# Patient Record
Sex: Male | Born: 1935 | Race: White | Hispanic: No | Marital: Married | Smoking: Former smoker
Health system: Southern US, Community
[De-identification: ages and names within clinical notes are randomized; demographics above are authoritative.]

## PROBLEM LIST (undated history)

## (undated) DIAGNOSIS — N320 Bladder-neck obstruction: Secondary | ICD-10-CM

## (undated) DIAGNOSIS — R29898 Other symptoms and signs involving the musculoskeletal system: Secondary | ICD-10-CM

## (undated) DIAGNOSIS — Z8679 Personal history of other diseases of the circulatory system: Secondary | ICD-10-CM

## (undated) DIAGNOSIS — I10 Essential (primary) hypertension: Secondary | ICD-10-CM

## (undated) DIAGNOSIS — F0390 Unspecified dementia without behavioral disturbance: Secondary | ICD-10-CM

## (undated) DIAGNOSIS — N529 Male erectile dysfunction, unspecified: Secondary | ICD-10-CM

## (undated) DIAGNOSIS — E785 Hyperlipidemia, unspecified: Secondary | ICD-10-CM

## (undated) DIAGNOSIS — G4730 Sleep apnea, unspecified: Secondary | ICD-10-CM

## (undated) DIAGNOSIS — L03011 Cellulitis of right finger: Secondary | ICD-10-CM

## (undated) DIAGNOSIS — N471 Phimosis: Secondary | ICD-10-CM

## (undated) DIAGNOSIS — I4510 Unspecified right bundle-branch block: Secondary | ICD-10-CM

## (undated) DIAGNOSIS — R2991 Unspecified symptoms and signs involving the musculoskeletal system: Secondary | ICD-10-CM

## (undated) DIAGNOSIS — C61 Malignant neoplasm of prostate: Secondary | ICD-10-CM

## (undated) DIAGNOSIS — I00 Rheumatic fever without heart involvement: Secondary | ICD-10-CM

## (undated) DIAGNOSIS — N400 Enlarged prostate without lower urinary tract symptoms: Secondary | ICD-10-CM

## (undated) DIAGNOSIS — J45909 Unspecified asthma, uncomplicated: Secondary | ICD-10-CM

## (undated) DIAGNOSIS — J309 Allergic rhinitis, unspecified: Secondary | ICD-10-CM

## (undated) DIAGNOSIS — Z9289 Personal history of other medical treatment: Secondary | ICD-10-CM

## (undated) DIAGNOSIS — K219 Gastro-esophageal reflux disease without esophagitis: Secondary | ICD-10-CM

## (undated) DIAGNOSIS — R55 Syncope and collapse: Secondary | ICD-10-CM

## (undated) DIAGNOSIS — Z9889 Other specified postprocedural states: Secondary | ICD-10-CM

## (undated) DIAGNOSIS — R350 Frequency of micturition: Secondary | ICD-10-CM

## (undated) DIAGNOSIS — E119 Type 2 diabetes mellitus without complications: Secondary | ICD-10-CM

## (undated) DIAGNOSIS — L03012 Cellulitis of left finger: Secondary | ICD-10-CM

## (undated) DIAGNOSIS — J449 Chronic obstructive pulmonary disease, unspecified: Secondary | ICD-10-CM

## (undated) DIAGNOSIS — R269 Unspecified abnormalities of gait and mobility: Secondary | ICD-10-CM

## (undated) DIAGNOSIS — I252 Old myocardial infarction: Secondary | ICD-10-CM

## (undated) DIAGNOSIS — Z8709 Personal history of other diseases of the respiratory system: Secondary | ICD-10-CM

## (undated) DIAGNOSIS — G4733 Obstructive sleep apnea (adult) (pediatric): Secondary | ICD-10-CM

## (undated) HISTORY — DX: Malignant neoplasm of prostate: C61

## (undated) HISTORY — DX: Unspecified symptoms and signs involving the musculoskeletal system: R29.91

## (undated) HISTORY — DX: Personal history of other diseases of the circulatory system: Z86.79

## (undated) HISTORY — DX: Chronic obstructive pulmonary disease, unspecified: J44.9

## (undated) HISTORY — DX: Cellulitis of right finger: L03.011

## (undated) HISTORY — DX: Old myocardial infarction: I25.2

## (undated) HISTORY — DX: Syncope and collapse: R55

## (undated) HISTORY — DX: Obstructive sleep apnea (adult) (pediatric): G47.33

## (undated) HISTORY — PX: HERNIA REPAIR: SHX51

## (undated) HISTORY — DX: Personal history of other diseases of the respiratory system: Z87.09

## (undated) HISTORY — DX: Type 2 diabetes mellitus without complications: E11.9

## (undated) HISTORY — DX: Essential (primary) hypertension: I10

## (undated) HISTORY — DX: Benign prostatic hyperplasia without lower urinary tract symptoms: N40.0

## (undated) HISTORY — DX: Unspecified dementia, unspecified severity, without behavioral disturbance, psychotic disturbance, mood disturbance, and anxiety: F03.90

## (undated) HISTORY — DX: Hyperlipidemia, unspecified: E78.5

## (undated) HISTORY — DX: Personal history of other medical treatment: Z92.89

## (undated) HISTORY — DX: Bladder-neck obstruction: N32.0

## (undated) HISTORY — DX: Unspecified asthma, uncomplicated: J45.909

## (undated) HISTORY — DX: Rheumatic fever without heart involvement: I00

## (undated) HISTORY — DX: Frequency of micturition: R35.0

## (undated) HISTORY — DX: Other symptoms and signs involving the musculoskeletal system: R29.898

## (undated) HISTORY — DX: Unspecified abnormalities of gait and mobility: R26.9

## (undated) HISTORY — DX: Male erectile dysfunction, unspecified: N52.9

## (undated) HISTORY — DX: Other specified postprocedural states: Z98.890

## (undated) HISTORY — DX: Cellulitis of left finger: L03.012

## (undated) HISTORY — DX: Phimosis: N47.1

## (undated) HISTORY — DX: Sleep apnea, unspecified: G47.30

## (undated) HISTORY — DX: Allergic rhinitis, unspecified: J30.9

## (undated) HISTORY — DX: Gastro-esophageal reflux disease without esophagitis: K21.9

## (undated) HISTORY — DX: Unspecified right bundle-branch block: I45.10

## (undated) HISTORY — PX: VASECTOMY: SHX75

---
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
## 1998-10-23 ENCOUNTER — Ambulatory Visit (HOSPITAL_COMMUNITY): Admission: RE | Admit: 1998-10-23 | Discharge: 1998-10-23 | Payer: Self-pay | Admitting: Gastroenterology

## 2003-12-19 ENCOUNTER — Ambulatory Visit (HOSPITAL_COMMUNITY): Admission: RE | Admit: 2003-12-19 | Discharge: 2003-12-19 | Payer: Self-pay | Admitting: Gastroenterology

## 2004-10-12 ENCOUNTER — Emergency Department (HOSPITAL_COMMUNITY): Admission: EM | Admit: 2004-10-12 | Discharge: 2004-10-12 | Payer: Self-pay | Admitting: Emergency Medicine

## 2005-11-17 ENCOUNTER — Encounter: Admission: RE | Admit: 2005-11-17 | Discharge: 2005-11-17 | Payer: Self-pay | Admitting: Internal Medicine

## 2006-05-31 ENCOUNTER — Ambulatory Visit (HOSPITAL_COMMUNITY): Admission: RE | Admit: 2006-05-31 | Discharge: 2006-05-31 | Payer: Self-pay | Admitting: Urology

## 2006-06-08 ENCOUNTER — Ambulatory Visit: Admission: RE | Admit: 2006-06-08 | Discharge: 2006-08-07 | Payer: Self-pay | Admitting: Radiation Oncology

## 2006-08-16 ENCOUNTER — Encounter: Admission: RE | Admit: 2006-08-16 | Discharge: 2006-08-16 | Payer: Self-pay | Admitting: Urology

## 2006-08-23 ENCOUNTER — Ambulatory Visit (HOSPITAL_BASED_OUTPATIENT_CLINIC_OR_DEPARTMENT_OTHER): Admission: RE | Admit: 2006-08-23 | Discharge: 2006-08-23 | Payer: Self-pay | Admitting: Urology

## 2006-09-13 ENCOUNTER — Ambulatory Visit: Admission: RE | Admit: 2006-09-13 | Discharge: 2006-12-20 | Payer: Self-pay | Admitting: Radiation Oncology

## 2008-06-05 ENCOUNTER — Observation Stay (HOSPITAL_COMMUNITY): Admission: EM | Admit: 2008-06-05 | Discharge: 2008-06-06 | Payer: Self-pay | Admitting: Emergency Medicine

## 2008-10-16 ENCOUNTER — Encounter: Payer: Self-pay | Admitting: Pulmonary Disease

## 2008-11-14 DIAGNOSIS — J449 Chronic obstructive pulmonary disease, unspecified: Secondary | ICD-10-CM

## 2008-11-14 HISTORY — DX: Chronic obstructive pulmonary disease, unspecified: J44.9

## 2008-11-15 ENCOUNTER — Ambulatory Visit: Payer: Self-pay | Admitting: Pulmonary Disease

## 2008-11-15 DIAGNOSIS — J309 Allergic rhinitis, unspecified: Secondary | ICD-10-CM

## 2008-11-15 DIAGNOSIS — I252 Old myocardial infarction: Secondary | ICD-10-CM

## 2008-11-15 DIAGNOSIS — E785 Hyperlipidemia, unspecified: Secondary | ICD-10-CM | POA: Insufficient documentation

## 2008-11-15 DIAGNOSIS — I10 Essential (primary) hypertension: Secondary | ICD-10-CM | POA: Insufficient documentation

## 2008-11-15 DIAGNOSIS — I219 Acute myocardial infarction, unspecified: Secondary | ICD-10-CM | POA: Insufficient documentation

## 2008-11-15 HISTORY — DX: Essential (primary) hypertension: I10

## 2008-11-15 HISTORY — DX: Old myocardial infarction: I25.2

## 2008-11-15 HISTORY — DX: Allergic rhinitis, unspecified: J30.9

## 2008-11-20 DIAGNOSIS — R05 Cough: Secondary | ICD-10-CM | POA: Insufficient documentation

## 2008-11-20 DIAGNOSIS — R059 Cough, unspecified: Secondary | ICD-10-CM | POA: Insufficient documentation

## 2008-11-29 ENCOUNTER — Ambulatory Visit: Payer: Self-pay | Admitting: Pulmonary Disease

## 2009-01-02 ENCOUNTER — Telehealth (INDEPENDENT_AMBULATORY_CARE_PROVIDER_SITE_OTHER): Payer: Self-pay | Admitting: *Deleted

## 2009-01-02 ENCOUNTER — Ambulatory Visit: Payer: Self-pay | Admitting: Internal Medicine

## 2009-01-02 ENCOUNTER — Ambulatory Visit: Payer: Self-pay | Admitting: Pulmonary Disease

## 2009-01-02 DIAGNOSIS — C61 Malignant neoplasm of prostate: Secondary | ICD-10-CM

## 2009-01-02 HISTORY — DX: Malignant neoplasm of prostate: C61

## 2009-03-13 ENCOUNTER — Encounter: Admission: RE | Admit: 2009-03-13 | Discharge: 2009-03-13 | Payer: Self-pay | Admitting: Internal Medicine

## 2009-05-29 ENCOUNTER — Encounter
Admission: RE | Admit: 2009-05-29 | Discharge: 2009-05-29 | Payer: Self-pay | Admitting: Physical Medicine and Rehabilitation

## 2010-09-30 ENCOUNTER — Ambulatory Visit: Payer: Self-pay | Admitting: Cardiovascular Disease

## 2010-11-18 NOTE — Assessment & Plan Note (Signed)
Summary: cough/congestion/see tp per kc/LC   Primary Provider/Referring Provider:  Earl Gala  CC:  increased SOB, prod cough with white/yellow mucus occ spotted with blood, head congestion/sinus pressure with PND, and chills x1 day - patient states he has be battling with these symptoms since 09-2008.  History of Present Illness: 76 year old male with known history of HTN presented for initial pulmonary consult on 128//2010 for persistent cough/dyspnea.   11/29/08--the pt comes in today for f/u of his cyclical cough.  He is 90% improved, and feels that it is just about gone.   He feels no further tickle, and there has been no mucus production. After Advair was stopped, rec to change ACE to Micardis, and PPI w/ voice rest/ cough suppression regimen.   January 02, 2009--Returns w/ increased SOB, prod cough with white/yellow mucus occ spotted with blood, head congestion/sinus pressure with PND, chills x1 day - patient states he has be battling with these symptoms since 09-2008. After review, it appears pt restarted lisinopril  7-10 days ago after follow up w/ Dr. Melburn Popper. Reports he was doing well w/ almost total resolution of cough until last week.  Denies chest pain, dyspnea, orthopnea,  fever, n/v/d, edema, headache.  Current Problems (verified): 1)  Hx of Prostate Cancer  (ICD-185) 2)  Cough  (ICD-786.2) 3)  Hx of Myocardial Infarction  (ICD-410.90) 4)  Hypertension  (ICD-401.9) 5)  Hyperlipidemia  (ICD-272.4) 6)  Allergic Rhinitis  (ICD-477.9) 7)  Asthma  (ICD-493.90)  Current Medications (verified): 1)  Dyazide 37.5-25 Mg Caps (Triamterene-Hctz) .... Once Daily 2)  Adult Aspirin Ec Low Strength 81 Mg Tbec (Aspirin) .... Once Daily 3)  Cvs Ibuprofen 200 Mg Caps (Ibuprofen) .... 3 Tabs Once Daily As Needed , The Two Times A Day 4)  Finasteride 5 Mg Tabs (Finasteride) .... Once Daily 5)  Nitro-Dur 0.4 Mg/hr Pt24 (Nitroglycerin) .... Under The Tongue As Needed For Chest Pain  6)  Metoprolol Succinate 25 Mg Xr24h-Tab (Metoprolol Succinate) .Marland Kitchen.. 1 Ta Once A Day 7)  Crestor 5 Mg Tabs (Rosuvastatin Calcium) .... Once Daily 8)  Acid Controller Max St 20 Mg Tabs (Famotidine) .... Two Times A Day 9)  Micardis 40 Mg Tabs (Telmisartan) .... Once Daily 10)  Tessalon Perles 100 Mg  Caps (Benzonatate) .... One To Two By Mouth 3-4 Times Daily  Allergies (verified): 1)  ! * Albuterol Inhaler 2)  ! * Advair  Past History:  Past Medical History:    Asthma    Hx of PROSTATE CANCER (ICD-185)    COUGH (ICD-786.2)    Hx of MYOCARDIAL INFARCTION (ICD-410.90)    HYPERTENSION (ICD-401.9)    HYPERLIPIDEMIA (ICD-272.4)    ALLERGIC RHINITIS (ICD-477.9)    ASTHMA (ICD-493.90)       Review of Systems      See HPI  Vital Signs:  Patient profile:   76 year old male Height:      70 inches Weight:      205 pounds BMI:     29.52 O2 Sat:      96 % Temp:     97.8 degrees F oral Pulse rate:   86 / minute BP sitting:   134 / 88  (left arm) Cuff size:   regular  Vitals Entered By: Boone Master CNA (January 02, 2009 4:30 PM)  O2 Flow:  room air CC: increased SOB, prod cough with white/yellow mucus occ spotted with blood, head congestion/sinus pressure with PND, chills x1 day - patient states he has be  battling with these symptoms since 09-2008 Is Patient Diabetic? No Comments Medications reviewed with patient Boone Master CNA  January 02, 2009 4:30 PM    Physical Exam  Additional Exam:  GEN: A/Ox3; pleasant , NAD HEENT:  [place]/AT, , EACs-clear, TMs-wnl, NOSE-clear discharge, THROAT-clear NECK:  Supple w/ fair ROM; no JVD; normal carotid impulses w/o bruits; no thyromegaly or nodules palpated; no lymphadenopathy. CHEST:  Coarse BS w/ no wheezing , cough on forced expiration HEART:  RRR, no m/r/g   ABDOMEN:  Soft & nt; nml bowel sounds; no organomegaly or masses detected. EXT: Warm bil,  no calf pain, edema, clubbing, pulses intact Neuro:no focal deficits noted    Pulmonary Function Test Height (in.): 70 Gender: Male  Impression & Recommendations:  Problem # 1:  COUGH (ICD-786.2) Upper airway cough syndrome-suspect is multifactoral in nature, reflux, ACE, +/- rhinitis  Will check xray today Cough was improved off ACE, will change back to ARB  REC: Mucinex DM two times a day  Tessalon perles three times a day for cough if still couging Tussicaps two times a day as needed cough  Stop Lisinopril Restart Micardis once daily  Voice rest, follow cyclical cough sheet.  Zytec 10mg  at bedtime for nasal drainage.  Increased pepcid two times a day  follow up 2 weeks Dr. Shelle Iron  Please contact office for sooner follow up if symptoms do not improve or worsen   Medications Added to Medication List This Visit: 1)  Tussicaps 10-8 Mg Xr12h-cap (Hydrocod polst-chlorphen polst) .Marland Kitchen.. 1 by mouth two times a day as needed cough  Complete Medication List: 1)  Dyazide 37.5-25 Mg Caps (Triamterene-hctz) .... Once daily 2)  Adult Aspirin Ec Low Strength 81 Mg Tbec (Aspirin) .... Once daily 3)  Cvs Ibuprofen 200 Mg Caps (Ibuprofen) .... 3 tabs once daily as needed , the two times a day 4)  Finasteride 5 Mg Tabs (Finasteride) .... Once daily 5)  Nitro-dur 0.4 Mg/hr Pt24 (Nitroglycerin) .... Under the tongue as needed for chest pain 6)  Metoprolol Succinate 25 Mg Xr24h-tab (Metoprolol succinate) .Marland Kitchen.. 1 ta once a day 7)  Crestor 5 Mg Tabs (Rosuvastatin calcium) .... Once daily 8)  Acid Controller Max St 20 Mg Tabs (Famotidine) .... Two times a day 9)  Micardis 40 Mg Tabs (Telmisartan) .... Once daily 10)  Tessalon Perles 100 Mg Caps (Benzonatate) .... One to two by mouth 3-4 times daily 11)  Tussicaps 10-8 Mg Xr12h-cap (Hydrocod polst-chlorphen polst) .Marland Kitchen.. 1 by mouth two times a day as needed cough  Other Orders: T-2 View CXR, Same Day (71020.5TC) Est. Patient Level III (16109) Prescription Created Electronically [id number])  Patient Instructions:  1)  Mucinex DM two times a day  2)  Tessalon perles three times a day for cough 3)  if still couging Tussicaps two times a day as needed cough  4)  Stop Lisinopril 5)  Restart Micardis once daily  6)  Voice rest, follow cyclical cough sheet.  7)  Zytec 10mg  at bedtime for nasal drainage.  8)  Increased pepcid two times a day  9)  follow up 2 weeks Dr. Shelle Iron  10)  Please contact office for sooner follow up if symptoms do not improve or worsen  Prescriptions: TUSSICAPS 10-8 MG XR12H-CAP (HYDROCOD POLST-CHLORPHEN POLST) 1 by mouth two times a day as needed cough  #20 x 0   Entered and Authorized by:   Rubye Oaks NP   Signed by:   Rubye Oaks NP on 01/02/2009  Method used:   Print then Give to Patient   RxID:   [id number] TESSALON PERLES 100 MG  CAPS (BENZONATATE) One to two by mouth 3-4 times daily  #30 x 1   Entered and Authorized by:   Rubye Oaks NP   Signed by:   Rubye Oaks NP on 01/02/2009   Method used:   Electronically to        [id number] (retail)       [address] [place], [place]  [address]       Ph: [phone]       Fax: [phone]   RxID:   [id number]

## 2010-11-18 NOTE — Progress Notes (Signed)
Summary: sick  Phone Note Call from Patient Call back at Home Phone [phone]   Caller: Spouse Call For: clance Reason for Call: Acute Illness, Talk to Nurse Complaint: Cough/Sore throat Summary of Call: bad cough, sore throat, some blood last night(small amt), chills, sneezing, sob,head and chest congestion. Initial call taken by: Eugene Gavia,  January 02, 2009 9:21 AM  Follow-up for Phone Call        Pt c/o severe cough and congestion. Pt says he cougherd up a small amount of blood yesterday and he is more sob. Per KC, pt should come see TP. Pt to see TP today at 4:15. Follow-up by: Michel Bickers CMA,  January 02, 2009 10:00 AM

## 2010-11-18 NOTE — Assessment & Plan Note (Signed)
Summary: rov for cough.   PCP:  Earl Gala  Chief Complaint:  2 week cough follow-up.  Pt states cough is much improved and no problems currently.Marland Kitchen  History of Present Illness: the pt comes in today for f/u of his cyclical cough.  He is 90% improved, and feels that it is just about gone.   He feels no further tickle, and there has been no mucus production.    Prior Medications Reviewed Using: List Brought by Patient  Updated Prior Medication List: PROVENTIL HFA 108 (90 BASE) MCG/ACT AERS (ALBUTEROL SULFATE) 2 puffs every 4 hr DYAZIDE 37.5-25 MG CAPS (TRIAMTERENE-HCTZ) once daily ADULT ASPIRIN EC LOW STRENGTH 81 MG TBEC (ASPIRIN) once daily [facility] IBUPROFEN 200 MG CAPS (IBUPROFEN) 3 tabs once daily as needed , the two times a day FINASTERIDE 5 MG TABS (FINASTERIDE) once daily NITRO-DUR 0.4 MG/HR PT24 (NITROGLYCERIN) under the tongue as needed for chest pain METOPROLOL SUCCINATE 25 MG XR24H-TAB (METOPROLOL SUCCINATE) 1 ta once a day CRESTOR 5 MG TABS (ROSUVASTATIN CALCIUM) once daily ACID CONTROLLER MAX ST 20 MG TABS (FAMOTIDINE) two times a day MICARDIS 40 MG TABS (TELMISARTAN) once daily TESSALON PERLES 100 MG  CAPS (BENZONATATE) One to two by mouth 3-4 times daily  Current Allergies (reviewed today): No known allergies   Vital Signs:  Patient Profile:   76 Years Old Male Weight:      207.38 pounds O2 Sat:      95 % O2 treatment:    Room Air Temp:     97.5 degrees F oral Pulse rate:   70 / minute BP sitting:   138 / 78  (left arm) Cuff size:   regular  Vitals Entered By: Cloyde Reams RN (November 29, 2008 8:56 AM)             Comments Pt is here today for a 2 week follow-up visit.  Medications reviewed with patient Cloyde Reams RN  November 29, 2008 8:57 AM      Physical Exam  General:     wd male in nad   Impression & Recommendations:  Problem # 1:  COUGH (ICD-786.2)  the pt's cough has almost totally resolved.  He should continue with the behavioral techniques, and can use tessalon pearls as needed to keep his cough from re-escalating.  He will f/u as needed.  Patient Instructions: 1)  gradually increase voice use 2)  continue with lozenges, as needed tessalon pearls 3)  please call if cough recurs.

## 2011-03-03 NOTE — H&P (Signed)
NAME:  KADYN, GUILD NO.:  [id number]   MEDICAL RECORD NO.:  [id number]          PATIENT TYPE:  EMS   LOCATION:  MAJO                         FACILITY:  [facility]   PHYSICIAN:  Hollice Espy, M.D.    DATE OF BIRTH:   DATE OF ADMISSION:  06/05/2008  DATE OF DISCHARGE:                              HISTORY & PHYSICAL   PRIMARY CARE PHYSICIAN:  Theressa Millard, M.D.   CONSULTANT:  Vesta Mixer, M.D., Cardiology.   CHIEF COMPLAINTS:  Chest pain.   HISTORY OF PRESENT ILLNESS:  The patient is a 76 year old white male  with past medical history of hyperlipidemia, hypertension and tobacco  abuse who today started having severe pain in his chest.  He describes  it initially starting over his left scapula, radiating to the left side  of his chest.  He describes it as a squeezing, aching, severe 10/10  pain.  It also went up to his left shoulder and found to have some left  arm associated numbness.  He noted  some associated shortness of breath  and some lightheadedness.  The patient then came into the emergency  room.  He was given doses of  nitroglycerin, aspirin and morphine and  then eventually the pain subsided leaving him with some residual  pressure and then eventually went away completely.  The patient had an  EKG done which showed an incomplete right bundle branch block which was  slightly different from his previous EKG 2 years prior.  A chest x-ray  was done, which showed no evidence of any acute pulmonary disease and  cardiac markers done which were unremarkable.  Currently the patient is  feeling better.  He denies any headaches, vision changes, dysphagia,  chest pain, palpitations, shortness of breath, wheeze, cough, abdominal  pain, hematuria, dysuria, constipation, diarrhea, focal extremity  numbness, weakness or pain.   REVIEW OF SYSTEMS:  Otherwise negative.   PAST MEDICAL HISTORY:  Includes tobacco abuse with cigars hypertension,  hyperlipidemia,  rhabdomyolysis secondary statin, so he no longer takes  that.  Seasonal rhinitis, and BPH.   MEDICATIONS:  The patient is on finasteride 5, triamterene 37.5/25.  He  was on a prednisone taper,  but stopped taking the prednisone because it  made him jittery and have restless sleep.   ALLERGIES:  TETRACYCLINE AND PENICILLIN.   SOCIAL HISTORY:  No alcohol or drug use.  He does smoke about 3-4 cigars  a day.   FAMILY HISTORY:  Noted for mom with CHF.   PHYSICAL EXAMINATION:  VITAL SIGNS:  On admission temperature 97.6.  Heart rate 82, blood pressure 110/69, respirations 20, O2 sat 98% on 2  L.  GENERAL:  He is alert and oriented x3 in no apparent distress.  HEENT:  Normocephalic atraumatic.  Mucous membranes are moist.  He has  no carotid bruits.  HEART:  Regular rate and rhythm.  S1 and S2.  LUNGS:  Clear to auscultation bilaterally.  ABDOMEN:  Soft, nontender, nondistended.  Positive bowel sounds.  EXTREMITIES:  Show no clubbing, cyanosis or edema.   LAB WORK:  Chest  x-ray shows no acute cardiopulmonary disease.  Area of  apparent superior mediastinal soft tissue fullness and peribronchial  thickening, likely from chronic bronchitis or smoking.   Lab work:  Sodium 139, potassium 2.9, chloride 104, BUN 16, creatinine  1.1, glucose 112.  CPK 24, MB less than 1, troponin-I less than 0.05,  white count 9.1, H and H 13.8 and 40.  MCV of 9.  Platelet count 160, no  shift.  LFTs are noted for an albumin of 3.1.  EKG notes a normal sinus  rhythm, occasional PVCs, left axis deviation, incomplete right bundle  branch block.  When compared to an EKG in 2007, at that time shows some  left axis deviation.   ASSESSMENT AND PLAN:  1. Chest pain.  Good story, plus he has a history of hyperlipidemia,      not on statins and now a possible new incomplete right bundle      branch block on electrocardiogram.  Place on telemetry bed.  Check      enzymes x3.  I have spoken to Dr. Delane Ginger, who is  covering for      [facility].  He will come and see the patient and decide if      he needs an inpatient versus outpatient stress test.  2. Hypertension.  Continue meds.  3. Tobacco abuse.      Hollice Espy, M.D.  Electronically Signed     SKK/MEDQ  D:  06/05/2008  T:  06/05/2008  Job:  [id number]   cc:   Theressa Millard, M.D.  Vesta Mixer, M.D.

## 2011-03-03 NOTE — Consult Note (Signed)
NAME:  ELLARD, NAN NO.:  [id number]   MEDICAL RECORD NO.:  [id number]          PATIENT TYPE:  OBV   LOCATION:  1828                         FACILITY:  [facility]   PHYSICIAN:  Vesta Mixer, M.D. DATE OF BIRTH:  1935/08/25   DATE OF CONSULTATION:  06/05/2008  DATE OF DISCHARGE:                                 CONSULTATION   [name] [name] is a 76 year old gentleman with a history of  hypercholesterolemia, hypertension, and prostate cancer.  He was  admitted to the hospital after having a severe episode of chest pain  earlier today.   The patient was eating lunch today around 2 o'clock.  He developed  severe intense back pain that he felt like the worst muscle spasm of his  life.  It radiated through to his front.  This was associated with  nausea, diaphoresis, shortness of breath, and some left arm tingling and  numbness.  The pain lasted for hours and he came to the emergency room.  He was given several nitroglycerin with no relief.  He was also given  several milligrams of morphine with incomplete relief.  It was finally  relieved 6-8 hours later.   CURRENT MEDICATIONS:  1. Maxzide once a day.  2. Prednisone 20 mg a day.  3. Finasteride once a day.   ALLERGIES:  He is allergic to PENICILLIN and TETRACYCLINE.   PAST MEDICAL HISTORY:  1. Hypertension.  2. Chest pain.  3. Prostate cancer.  4. Hyperlipidemia.   SOCIAL HISTORY:  The patient smokes cigars.   FAMILY HISTORY:  Noncontributory.   REVIEW OF SYSTEMS:  Otherwise is negative.   PHYSICAL EXAMINATION:  GENERAL:  He is an elderly gentleman in no acute  distress.  He is alert and oriented x3 and his mood and affect are  normal.  VITAL SIGNS:  His blood pressure is 110/69 with heart rate of 82.  LUNGS:  Clear.  HEART:  Regular rate.  S1-S2.  ABDOMEN:  Good bowel sounds and is nontender.  EXTREMITIES:  No clubbing, cyanosis, or edema.  NEUROLOGIC:  Nonfocal.   LABORATORY DATA:  CBC is within  normal limits.  His Chem-7 reveals a  potassium of 3.3.  His CPK is 15 with 1.0 MBs.  His troponin is 0.01.   His EKG reveals an incomplete right bundle branch block.  He has a  normal sinus rhythm.   IMPRESSION AND PLAN:  Chest pain.  The patient's chest pain is entirely  consistent with angina.  It lasted for 6 hours.  His enzymes are  currently negative.  If his enzymes are negative after this prolonged  episode of chest pain, then I suspect that is noncardiac in origin.  On  the other hand, if his enzymes are positive, then he will need a heart  catheterization tomorrow.  We will collect further enzymes tonight and  tomorrow morning.  If his enzymes are positive, we should start him on  heparin.  We will plan on doing a stress Cardiolite study as an  outpatient if they are negative.  ______________________________  Vesta Mixer, M.D.     PJN/MEDQ  D:  06/05/2008  T:  06/06/2008  Job:  [id number]   cc:   Theressa Millard, M.D.

## 2011-03-03 NOTE — Discharge Summary (Signed)
NAME:  DANIELA, HERNAN NO.:  [id number]   MEDICAL RECORD NO.:  [id number]          PATIENT TYPE:  OBV   LOCATION:  3729                         FACILITY:  [facility]   PHYSICIAN:  Theressa Millard, M.D.    DATE OF BIRTH:  16-Jan-1935   DATE OF ADMISSION:  06/05/2008  DATE OF DISCHARGE:  06/06/2008                               DISCHARGE SUMMARY   ADMITTING DIAGNOSIS:  Prolonged chest pain.   DISCHARGE DIAGNOSES:  1. Prolonged chest pain, ? etiology, no evidence of myocardial      infarction, pulmonary embolism or aortic dissection.  2. Hypertension.  3. Hyperlipidemia (untreated secondary to muscle problems associated      with statins).  4. Seasonal rhinitis.  5. Benign prostatic hypertrophy.  6. Atherosclerotic peripheral vascular disease with evidence of      coronary and aortic calcifications, as well as 3 cm infrarenal      abdominal aortic aneurysm and other evidence of atheromatous      plaquing.   The patient is a 76 year old white male who presented with a six-hour  history of severe chest pain that started actually in the mid scapular  region and radiated around to the chest.  Pain was very severe, not  associated with nausea, vomiting, diaphoresis, or shortness of breath.  He came to the emergency department.   HOSPITAL COURSE:  The patient was admitted.  On the basis of serial  enzymes and EKGs, a myocardial infarction was ruled out.  He was seen in  consultation by Dr. Elease Hashimoto, who thought that this was either  representative of a myocardial infarction or was not coronary at all.  My opinion was that the patient needed a CT scan of the chest after his  myocardial infarction was ruled out, and this was undertaken.  Findings  are described above.  In the absence of evidence of a myocardial  infarction, aortic dissection or pulmonary embolism, the patient was  discharged in improved condition.  I thought that this likely represents  angina because of the  long duration of the discomfort.  At this point,  we will consider an outpatient stress test in the future and otherwise  observe.   DISCHARGE MEDICATIONS:  1. Pepcid AC 1 b.i.d.  2. Ibuprofen 600 mg p.r.n.  3. Finasteride 5 mg daily.  4. Triamterene 37.5 and hydrochlorothiazide 25 mg daily.   FOLLOWUP:  He will keep his scheduled appointment with me.   ACTIVITIES:  As tolerated.   DIET:  No added salt.      Theressa Millard, M.D.  Electronically Signed     JO/MEDQ  D:  06/06/2008  T:  06/07/2008  Job:  [id number]

## 2011-03-06 NOTE — Op Note (Signed)
NAME:  [name] [name], [name] [name]                          ACCOUNT NO.:  [id number]   MEDICAL RECORD NO.:  [id number]                   PATIENT TYPE:  AMB   LOCATION:  ENDO                                 [facility]ACILITY[facility]  [facility]   PHYSICIAN:  Danise Edge, M.D.                DATE OF BIRTH:  [birth year]   DATE OF PROCEDURE:  12/19/2003  DATE OF DISCHARGE:                                 OPERATIVE REPORT   REFERRING PHYSICIAN:  Theressa Millard, M.D.   PROCEDURE PERFORMED:  Colonoscopy.   ENDOSCOPIST:  Charolett Bumpers, M.D.   INDICATIONS FOR PROCEDURE:  Jkwon Treptow is a 76 year old male born  09-30-1935.  Mr. Finnan is scheduled to undergo a screening colonoscopy  with polypectomy to prevent colon cancer.   PREMEDICATION:  Versed 7.5 mg.  Demerol 75 mg.   DESCRIPTION OF PROCEDURE:  After obtaining informed consent, Mr. Mercadel was  placed in the left lateral decubitus position.  I administered intravenous  Demerol and intravenous Versed to achieve conscious sedation for the  procedure.  The patient's blood pressure, oxygen saturations and cardiac  rhythm were monitored throughout the procedure and documented in the medical  record.   Anal inspection was normal.  Digital rectal exam was normal.  The prostate  was nonnodular.  The adjustable pediatric Olympus video colonoscope was  introduced into the rectum and advanced to the cecum.  Colonic preparation  for the exam today was satisfactory.   Rectum:  Normal.   Sigmoid colon and descending colon:  Left colonic diverticulosis.   Splenic flexure:  Normal.   Transverse colon:  Normal.   Hepatic flexure:  Normal.   Ascending colon:  Normal.   Cecum and ileocecal valve:  Normal.   ASSESSMENT:  Normal screening proctocolonoscopy to the cecum.  No endoscopic  evidence for the presence of colorectal neoplasia.                                               Danise Edge, M.D.    MJ/MEDQ  D:  12/19/2003  T:  12/19/2003  Job:   [id number]   cc:   Theressa Millard, M.D.  [address] [place]  [place] [address]  Fax: [phone]

## 2011-03-06 NOTE — Op Note (Signed)
NAME:  [name] [name], [name] [name] NO.:  [id number]   MEDICAL RECORD NO.:  [id number]          PATIENT TYPE:  AMB   LOCATION:  [facility]                         FACILITY:  [facility]   PHYSICIAN:  Boston Service, M.D.DATE OF BIRTH:  [birth year]   DATE OF PROCEDURE:  08/23/2006  DATE OF DISCHARGE:                                 OPERATIVE REPORT   PREOPERATIVE DIAGNOSIS:  Reference is made to office notes from 08/10/2006,  06/25/2006, second opinion Crecencio Mc, MD 06/04/2006, consultation with  Chipper Herb, MD August 2007 and office notes from 05/25/2006 and  05/19/2006.  Biopsy 05/19/2006 prostatic adenocarcinoma, Gleason VII 40% on  the right and PSA of 6.4.   PREOPERATIVE DIAGNOSIS:  Gleason VII prostatic adenocarcinoma 40% on the  right. CT scan 05/31/2006, bone scan 05/31/2006 showed no obvious evidence  of metastatic disease.  After lengthy discussion with the patient and family  members elected external radiation therapy, IMRT followed by seed implant.   POSTOPERATIVE DIAGNOSIS:  Gleason VII prostatic adenocarcinoma 40% on the  right. CT scan 05/31/2006, bone scan 05/31/2006 showed no obvious evidence  of metastatic disease.  After lengthy discussion with the patient and family  members elected external radiation therapy, IMRT followed by seed implant.   PROCEDURE:  Transperineal radioactive seed implant.   UROLOGIST:  Boston Service, MD.   RADIATION ONCOLOGIST:  Maryln Gottron, MD   ANESTHESIA:  General.   DRAINS:  18-French Foley.   ESTIMATED BLOOD LOSS:  Minimal.   COMPLICATIONS:  None obvious.   DESCRIPTION OF PROCEDURE:  The patient was prepped and draped in the dorsal  lithotomy position after institution of an adequate level of general  anesthesia.  Rectal tube was placed, transrectal ultrasound probe,  suprapubic fluoroscopy unit.  Planning in dosimetry carried out in  consultation with [employer].  Sonographic planning of the implant  was  carried out.  Transverse and longitudinal scans of the prostate were  obtained in an effort to duplicate previous scans.  The patient has now  completed his course of external IMRT radiation therapy.  Total number of  needles selected 21. Total number of seeds 53.  Isotope I-125, activity  15.476 mCi per seed.  With stabilization needles placed seeds were then  placed according to prearranged coordinates.  Reference is made to the  sonographic planning report from [employer]. Seeds appeared to be in good  position.  Once all seeds had been placed, stabilizing needles were removed.  Rectal tube was removed, transrectal ultrasound probe was removed.  Indwelling Foley catheter was also removed.  Fiberoptic cystoscopy showed a  normal urethra and sphincter, nonobstructive prostatic urethra.  Careful  inspection of the trigone showed clear efflux right and left orifice with no  obvious evidence of intravesical pathology, specifically no evidence of  seeds or bleeding sites within the bladder.  Bladder was filled to capacity.  Fiberoptic cystoscope was gently withdrawn.  18-French Foley was  inserted and left to straight drain.  The patient was returned to recovery  in satisfactory condition.  Plan is to remove Foley catheter tomorrow a.m.  for  a voiding trial the patient has adequate supply of Flomax, Vicodin,  Macrobid and Phenergan.           ______________________________  Boston Service, M.D.     RH/MEDQ  D:  08/23/2006  T:  08/23/2006  Job:  [id number]   cc:   Theressa Millard, M.D.  Fax: [phone]   Maryln Gottron, M.D.  Fax: [phone]

## 2011-04-07 ENCOUNTER — Encounter: Payer: Self-pay | Admitting: Cardiovascular Disease

## 2011-04-13 ENCOUNTER — Encounter: Payer: Self-pay | Admitting: Cardiovascular Disease

## 2011-04-13 ENCOUNTER — Other Ambulatory Visit (INDEPENDENT_AMBULATORY_CARE_PROVIDER_SITE_OTHER): Payer: 59 | Admitting: *Deleted

## 2011-04-13 ENCOUNTER — Ambulatory Visit (INDEPENDENT_AMBULATORY_CARE_PROVIDER_SITE_OTHER): Payer: 59 | Admitting: Cardiovascular Disease

## 2011-04-13 VITALS — BP 134/88 | HR 88 | Ht 70.0 in | Wt 197.8 lb

## 2011-04-13 DIAGNOSIS — M6281 Muscle weakness (generalized): Secondary | ICD-10-CM

## 2011-04-13 DIAGNOSIS — R2991 Unspecified symptoms and signs involving the musculoskeletal system: Secondary | ICD-10-CM

## 2011-04-13 DIAGNOSIS — R29898 Other symptoms and signs involving the musculoskeletal system: Secondary | ICD-10-CM | POA: Insufficient documentation

## 2011-04-13 DIAGNOSIS — I10 Essential (primary) hypertension: Secondary | ICD-10-CM

## 2011-04-13 DIAGNOSIS — E785 Hyperlipidemia, unspecified: Secondary | ICD-10-CM

## 2011-04-13 DIAGNOSIS — R0989 Other specified symptoms and signs involving the circulatory and respiratory systems: Secondary | ICD-10-CM

## 2011-04-13 HISTORY — DX: Unspecified symptoms and signs involving the musculoskeletal system: R29.91

## 2011-04-13 HISTORY — DX: Other symptoms and signs involving the musculoskeletal system: R29.898

## 2011-04-13 LAB — BASIC METABOLIC PANEL
BUN: 16 mg/dL (ref 6–23)
CO2: 27 mEq/L (ref 19–32)
Calcium: 9.1 mg/dL (ref 8.4–10.5)
Chloride: 102 mEq/L (ref 96–112)
Creatinine, Ser: 0.8 mg/dL (ref 0.4–1.5)
GFR: 98.88 mL/min (ref >60.00)
Glucose, Bld: 122 mg/dL — ABNORMAL HIGH (ref 70–99)
Potassium: 3.9 mEq/L (ref 3.5–5.1)
Sodium: 139 mEq/L (ref 135–145)

## 2011-04-13 LAB — LIPID PANEL
Cholesterol: 140 mg/dL (ref 0–200)
HDL: 31.6 mg/dL — ABNORMAL LOW (ref >39.00)
LDL Cholesterol: 81 mg/dL (ref 0–99)
Total CHOL/HDL Ratio: 4
Triglycerides: 135 mg/dL (ref 0.0–149.0)
VLDL: 27 mg/dL (ref 0.0–40.0)

## 2011-04-13 LAB — HEPATIC FUNCTION PANEL
ALT: 14 U/L (ref 0–53)
AST: 14 U/L (ref 0–37)
Albumin: 4.3 g/dL (ref 3.5–5.2)
Alkaline Phosphatase: 65 U/L (ref 39–117)
Bilirubin, Direct: 0.1 mg/dL (ref 0.0–0.3)
Total Bilirubin: 0.5 mg/dL (ref 0.3–1.2)
Total Protein: 7.1 g/dL (ref 6.0–8.3)

## 2011-04-13 NOTE — Assessment & Plan Note (Signed)
Well controlled.  Continue with current meds.

## 2011-04-13 NOTE — Progress Notes (Signed)
[name] [name] Date of Birth  Apr 04, 1935 [facility] [address]     [address] [place], [place]  [address] [phone]  Fax  [phone]  History of Present Illness:  No cardiac complaints.  Some congestion recently.  Complains of lower leg weakness. No recent orthostasis.   Current Outpatient Prescriptions on File Prior to Visit  Medication Sig Dispense Refill  . albuterol (PROVENTIL) (2.5 MG/3ML) 0.083% nebulizer solution Take 2.5 mg by nebulization every 6 (six) hours as needed.        . finasteride (PROSCAR) 5 MG tablet Take 5 mg by mouth daily.        . IBUPROFEN PO Take by mouth as needed.        Marland Kitchen losartan (COZAAR) 50 MG tablet Take 50 mg by mouth daily.        . metoprolol tartrate (LOPRESSOR) 25 MG tablet Take 12.5 mg by mouth 2 (two) times daily.       . nitroGLYCERIN (NITROSTAT) 0.4 MG SL tablet Place 0.4 mg under the tongue every 5 (five) minutes as needed.        . rosuvastatin (CRESTOR) 5 MG tablet Take 5 mg by mouth daily.        Marland Kitchen triamterene-hydrochlorothiazide (DYAZIDE) 37.5-25 MG per capsule Take 1 capsule by mouth every morning.          Allergies  Allergen Reactions  . Advair Hfa     REACTION: sensation of throat closing  . Lisinopril   . Penicillins   . Tetracyclines & Related     Past Medical History  Diagnosis Date  . Hypertension   . Hyperlipidemia   . History of orthostatic hypotension   . Prostate cancer   . BPH (benign prostatic hypertrophy)     No past surgical history on file.  History  Smoking status  . Current Some Day Smoker  . Types: Pipe  Smokeless tobacco  . Not on file    History  Alcohol Use No    Family History  Problem Relation Age of Onset  . Heart failure Mother     Reviw of Systems:  Reviewed in the HPI.  All other systems are negative.  Physical Exam: BP 134/88  Pulse 88  Ht 5\' 10"  (1.778 m)  Wt 197 lb 12.8 oz (89.721 kg)  BMI 28.38 kg/m2 The patient is alert  and oriented x 3.  The mood and affect are normal.   Skin: warm and dry.  Color is normal.    HEENT:   the sclera are nonicteric.  The mucous membranes are moist.  The carotids are 2+ without bruits.  There is no thyromegaly.  There is no JVD.    Lungs: clear.  The chest wall is non tender.    Heart: regular rate with a normal S1 and S2.  There are no murmurs, gallops, or rubs. The PMI is not displaced.     Abdomin: good bowel sounds.  There is no guarding or rebound.  There is no hepatosplenomegaly or tenderness.  There are no masses.   Extremities:  no clubbing, cyanosis, or edema.  The legs are without rashes.  The distal pulses are reduced.   Neuro:  Cranial nerves II - XII are intact.  Motor and sensory functions are intact.    The gait is normal.  Assessment / Plan:

## 2011-04-13 NOTE — Assessment & Plan Note (Signed)
He has reduced pulses in his feet.  Will check ABIs.

## 2011-04-14 ENCOUNTER — Telehealth: Payer: Self-pay | Admitting: *Deleted

## 2011-04-14 NOTE — Telephone Encounter (Signed)
Message copied by Antony Odea on Tue Apr 14, 2011  4:51 PM ------      Message from: Vesta Mixer      Created: Mon Apr 13, 2011  4:49 PM       ok

## 2011-04-14 NOTE — Telephone Encounter (Signed)
Patient called with lab results. Pt verbalized understanding. Jodette Clerence Gubser RN  

## 2011-04-27 ENCOUNTER — Encounter: Payer: 59 | Admitting: Cardiology

## 2011-05-04 ENCOUNTER — Encounter: Payer: 59 | Admitting: Cardiology

## 2011-05-07 ENCOUNTER — Other Ambulatory Visit: Payer: Self-pay | Admitting: Cardiology

## 2011-05-07 DIAGNOSIS — I739 Peripheral vascular disease, unspecified: Secondary | ICD-10-CM

## 2011-05-11 ENCOUNTER — Encounter (INDEPENDENT_AMBULATORY_CARE_PROVIDER_SITE_OTHER): Payer: 59 | Admitting: Cardiology

## 2011-05-11 DIAGNOSIS — I739 Peripheral vascular disease, unspecified: Secondary | ICD-10-CM

## 2011-05-13 ENCOUNTER — Encounter: Payer: Self-pay | Admitting: Cardiovascular Disease

## 2011-05-14 ENCOUNTER — Telehealth: Payer: Self-pay | Admitting: *Deleted

## 2011-05-14 NOTE — Telephone Encounter (Signed)
Message copied by Antony Odea on Thu May 14, 2011  5:02 PM ------      Message from: [place], [place] J      Created: Thu May 14, 2011  6:03 AM       Normal- no evidence of any vascular issues to explain his lower leg pain.  Suggest he consult his medical doctor - ? Neuropathy pain

## 2011-05-14 NOTE — Telephone Encounter (Signed)
Patient called with  results. Pt verbalized understanding. Alfonso Ramus RN

## 2011-06-17 ENCOUNTER — Other Ambulatory Visit: Payer: Self-pay | Admitting: *Deleted

## 2011-06-17 MED ORDER — LOSARTAN POTASSIUM 50 MG PO TABS: 50.0000 mg | ORAL_TABLET | Freq: Every day | ORAL | Status: DC

## 2011-06-17 NOTE — Telephone Encounter (Signed)
Fax received from pharmacy. Refill completed. Jodette Denicia Pagliarulo RN  

## 2011-06-19 ENCOUNTER — Telehealth: Payer: Self-pay | Admitting: Cardiovascular Disease

## 2011-06-19 MED ORDER — LOSARTAN POTASSIUM 50 MG PO TABS: 50.0000 mg | ORAL_TABLET | Freq: Every day | ORAL | Status: DC

## 2011-06-19 NOTE — Telephone Encounter (Signed)
Patient request refill. Done, Jodette Annelie Boak RN  

## 2011-06-19 NOTE — Telephone Encounter (Signed)
Called needing a refill of Losartan Potassium 15mg  at [facility] [phone]. Please call back. I have pulled his chart.

## 2011-10-14 ENCOUNTER — Other Ambulatory Visit: Payer: Self-pay | Admitting: Cardiovascular Disease

## 2011-10-15 ENCOUNTER — Ambulatory Visit (INDEPENDENT_AMBULATORY_CARE_PROVIDER_SITE_OTHER): Payer: 59 | Admitting: Cardiovascular Disease

## 2011-10-15 ENCOUNTER — Encounter: Payer: Self-pay | Admitting: Cardiovascular Disease

## 2011-10-15 DIAGNOSIS — I10 Essential (primary) hypertension: Secondary | ICD-10-CM

## 2011-10-15 DIAGNOSIS — I2510 Atherosclerotic heart disease of native coronary artery without angina pectoris: Secondary | ICD-10-CM

## 2011-10-15 MED ORDER — NITROGLYCERIN 0.4 MG SL SUBL: 0.4000 mg | SUBLINGUAL_TABLET | SUBLINGUAL | Status: DC | PRN

## 2011-10-15 NOTE — Assessment & Plan Note (Signed)
His heart rate and blood pressure been fairly well controlled. His blood pressures up little bit today. We'll continue the same medications. We'll have him cut out some of his salt intake. I'll see him again in 6 months.

## 2011-10-15 NOTE — Patient Instructions (Signed)
Your physician wants you to follow-up in: 6 months, You will receive a reminder letter in the mail two months in advance. If you don't receive a letter, please call our office to schedule the follow-up appointment.  Your physician recommends that you continue on your current medications as directed. Please refer to the Current Medication list given to you today.   

## 2011-10-15 NOTE — Progress Notes (Signed)
    [name] [name] Date of Birth  Jan 28, 1935 [facility] [address]    [address] [place], [place]  [address] [phone]  Fax  [phone]  [facility] [address] [place], [place]  [address] [phone]   Fax [phone]  History of Present Illness:  [name] [name] is a 76 year old gentleman with a history of hypertension. Also has intermittent episodes of orthostatic hypotension. He also has a history of hypercholesterolemia and history of prostate cancer.    Current Outpatient Prescriptions on File Prior to Visit  Medication Sig Dispense Refill  . albuterol (PROVENTIL) (2.5 MG/3ML) 0.083% nebulizer solution Take 2.5 mg by nebulization every 6 (six) hours as needed.        . CRESTOR 5 MG tablet TAKE 1 TABLET BY MOUTH ATBEDTIME  90 tablet  2  . finasteride (PROSCAR) 5 MG tablet Take 5 mg by mouth daily.        . IBUPROFEN PO Take by mouth as needed.        Marland Kitchen losartan (COZAAR) 50 MG tablet Take 1 tablet (50 mg total) by mouth daily.  90 tablet  2  . metoprolol tartrate (LOPRESSOR) 25 MG tablet Take 12.5 mg by mouth 2 (two) times daily.       . nitroGLYCERIN (NITROSTAT) 0.4 MG SL tablet Place 0.4 mg under the tongue every 5 (five) minutes as needed.        . triamterene-hydrochlorothiazide (DYAZIDE) 37.5-25 MG per capsule Take 1 capsule by mouth every morning.          Allergies  Allergen Reactions  . Advair Hfa     REACTION: sensation of throat closing  . Lisinopril   . Penicillins   . Tetracyclines & Related     Past Medical History  Diagnosis Date  . Hypertension   . Hyperlipidemia   . History of orthostatic hypotension   . Prostate cancer   . BPH (benign prostatic hypertrophy)     No past surgical history on file.  History  Smoking status  . Current Some Day Smoker  . Types: Pipe  Smokeless tobacco  . Not on file    History  Alcohol Use No    Family History  Problem Relation Age of Onset  . Heart failure Mother     Reviw of  Systems:  Reviewed in the HPI.  All other systems are negative.  Physical Exam: BP 147/89  Pulse 72  Ht 5' 10.5" (1.791 m)  Wt 199 lb 12.8 oz (90.629 kg)  BMI 28.26 kg/m2 The patient is alert and oriented x 3.  The mood and affect are normal.   Skin: warm and dry.  Color is normal.    HEENT:   Normocephalic/atraumatic. Carotids. No JVD.  Lungs: His lungs are clear.   Heart: Regular rate S1-S2. He has no murmurs.    Abdomen: Good bowel sounds. There is no hepatosplenomegaly.  Extremities:  No clubbing cyanosis or edema. There is no palpable cords.  Neuro:  Exam is nonfocal. His gait is normal.    ECG: Normal sinus rhythm. Left axis deviation. Pulmonary disease pattern. Nonspecific ST and T wave changes.  Assessment / Plan:

## 2011-12-22 ENCOUNTER — Other Ambulatory Visit: Payer: Self-pay

## 2011-12-22 ENCOUNTER — Other Ambulatory Visit: Payer: Self-pay | Admitting: Cardiovascular Disease

## 2011-12-22 MED ORDER — TRIAMTERENE-HCTZ 37.5-25 MG PO CAPS: 1.0000 | ORAL_CAPSULE | ORAL | Status: DC

## 2012-03-15 ENCOUNTER — Other Ambulatory Visit: Payer: Self-pay | Admitting: Cardiovascular Disease

## 2012-04-13 ENCOUNTER — Other Ambulatory Visit: Payer: Self-pay | Admitting: *Deleted

## 2012-04-13 ENCOUNTER — Ambulatory Visit (INDEPENDENT_AMBULATORY_CARE_PROVIDER_SITE_OTHER): Payer: 59 | Admitting: Cardiovascular Disease

## 2012-04-13 ENCOUNTER — Encounter: Payer: Self-pay | Admitting: Cardiovascular Disease

## 2012-04-13 VITALS — BP 136/88 | HR 77 | Ht 70.0 in | Wt 207.0 lb

## 2012-04-13 DIAGNOSIS — I10 Essential (primary) hypertension: Secondary | ICD-10-CM

## 2012-04-13 DIAGNOSIS — E785 Hyperlipidemia, unspecified: Secondary | ICD-10-CM

## 2012-04-13 DIAGNOSIS — Z79899 Other long term (current) drug therapy: Secondary | ICD-10-CM

## 2012-04-13 LAB — BASIC METABOLIC PANEL
BUN: 16 mg/dL (ref 6–23)
CO2: 28 mEq/L (ref 19–32)
Calcium: 9.4 mg/dL (ref 8.4–10.5)
Chloride: 101 mEq/L (ref 96–112)
Creatinine, Ser: 0.8 mg/dL (ref 0.4–1.5)
GFR: 103.01 mL/min (ref >60.00)
Glucose, Bld: 87 mg/dL (ref 70–99)
Potassium: 3.8 mEq/L (ref 3.5–5.1)
Sodium: 136 mEq/L (ref 135–145)

## 2012-04-13 LAB — LDL CHOLESTEROL, DIRECT: Direct LDL: 84.6 mg/dL

## 2012-04-13 LAB — LIPID PANEL
Cholesterol: 153 mg/dL (ref 0–200)
HDL: 33.6 mg/dL — ABNORMAL LOW (ref >39.00)
Total CHOL/HDL Ratio: 5
Triglycerides: 234 mg/dL — ABNORMAL HIGH (ref 0.0–149.0)
VLDL: 46.8 mg/dL — ABNORMAL HIGH (ref 0.0–40.0)

## 2012-04-13 LAB — HEPATIC FUNCTION PANEL
ALT: 16 U/L (ref 0–53)
AST: 14 U/L (ref 0–37)
Albumin: 4.2 g/dL (ref 3.5–5.2)
Alkaline Phosphatase: 54 U/L (ref 39–117)
Bilirubin, Direct: 0 mg/dL (ref 0.0–0.3)
Total Bilirubin: 0.5 mg/dL (ref 0.3–1.2)
Total Protein: 7.4 g/dL (ref 6.0–8.3)

## 2012-04-13 MED ORDER — METOPROLOL TARTRATE 25 MG PO TABS: 12.5000 mg | ORAL_TABLET | Freq: Two times a day (BID) | ORAL | Status: DC

## 2012-04-13 NOTE — Assessment & Plan Note (Signed)
[name] [name] is  doing well. He's not had any problems with his Crestor. We'll check fasting lipids, H&P, and basic metabolic profile today and again in 6 months. I'll see him again for an office visit in 6 months.

## 2012-04-13 NOTE — Assessment & Plan Note (Addendum)
His blood pressure is fairly well controlled.  I've asked him to pay a little bit closer attention to his diet and exercise more. I'll see him again in 6 months for followup office visit and fasting labs.

## 2012-04-13 NOTE — Progress Notes (Signed)
[name] Date of Birth  21-Oct-1934       [facility]    [place] [address]  [address] [place], [place]  [address]   [place], [place]  [address] [phone]     [phone]   Fax  [phone]    Fax [phone]  Problem List: 1. Hypertension 2. Orthostatic hypotension 3. Hypercholesterolemia 4. Prostate cancer 5. Dyslipidemia  History of Present Illness: [name] is a 77 year old gentleman with a history of hypertension. Also has intermittent episodes of orthostatic hypotension. He also has a history of hypercholesterolemia and history of prostate cancer.  [name] has had problems with fatigue.  He is limited by his arthritis and back pain.  He denies any chest pain or dyspnea.   Current Outpatient Prescriptions on File Prior to Visit  Medication Sig Dispense Refill  . albuterol (PROVENTIL) (2.5 MG/3ML) 0.083% nebulizer solution Take 2.5 mg by nebulization every 6 (six) hours as needed.        . CRESTOR 5 MG tablet TAKE 1 TABLET BY MOUTH ATBEDTIME  90 tablet  2  . finasteride (PROSCAR) 5 MG tablet Take 5 mg by mouth daily.        . IBUPROFEN PO Take by mouth as needed.        Marland Kitchen losartan (COZAAR) 50 MG tablet TAKE 1 TABLET (50 MG TOTAL) BY MOUTH DAILY.  90 tablet  2  . metoprolol tartrate (LOPRESSOR) 25 MG tablet Take 12.5 mg by mouth 2 (two) times daily.       . nitroGLYCERIN (NITROSTAT) 0.4 MG SL tablet Place 1 tablet (0.4 mg total) under the tongue every 5 (five) minutes as needed (up to three doses, if pain continues call 911).  25 tablet  11  . triamterene-hydrochlorothiazide (DYAZIDE) 37.5-25 MG per capsule Take 1 each (1 capsule total) by mouth every morning.  90 capsule  2    Allergies  Allergen Reactions  . Fluticasone-Salmeterol     REACTION: sensation of throat closing  . Lisinopril   . Penicillins   . Tetracyclines & Related     Past Medical History  Diagnosis Date  . Hypertension   . Hyperlipidemia   .  History of orthostatic hypotension   . Prostate cancer   . BPH (benign prostatic hypertrophy)     No past surgical history on file.  History  Smoking status  . Current Some Day Smoker  . Types: Pipe  Smokeless tobacco  . Not on file    History  Alcohol Use No    Family History  Problem Relation Age of Onset  . Heart failure Mother     Reviw of Systems:  Reviewed in the HPI.  All other systems are negative.  Physical Exam: Blood pressure 136/88, pulse 77, height 5\' 10"  (1.778 m), weight 207 lb (93.895 kg). General: Well developed, well nourished, in no acute distress.  Head: Normocephalic, atraumatic, sclera non-icteric, mucus membranes are moist,   Neck: Supple. Carotids are 2 + without bruits. No JVD  Lungs: Clear bilaterally to auscultation.  Heart: regular rate.  normal  S1 S2. No murmurs, gallops or rubs.  Abdomen: Soft, non-tender, non-distended with normal bowel sounds. No hepatomegaly. No rebound/guarding. No masses.  Msk:  Strength and tone are normal  Extremities: No clubbing or cyanosis. No edema.  Distal pedal pulses are 2+ and equal bilaterally.  Neuro: Alert and oriented X 3. Moves all extremities spontaneously.  Psych:  Responds to  questions appropriately with a normal affect.  ECG:  Assessment / Plan:

## 2012-04-13 NOTE — Telephone Encounter (Signed)
Fax Received. Refill Completed. [name] [name] (R.M.A)   

## 2012-04-13 NOTE — Patient Instructions (Signed)
Your physician wants you to follow-up in:  6 MONTHS WITH  NAHSER  You will receive a reminder letter in the mail two months in advance. If you don't receive a letter, please call our office to schedule the follow-up appointment. Your physician recommends that you continue on your current medications as directed. Please refer to the Current Medication list given to you today. Your physician recommends that you return for lab work in: TODAY  FASTING  BMET LIPID LIVER  AND IN 6 MONTHS  DX 272.4 V58.69

## 2012-04-15 ENCOUNTER — Other Ambulatory Visit: Payer: Self-pay | Admitting: *Deleted

## 2012-04-15 NOTE — Telephone Encounter (Signed)
Opened in Error.

## 2012-04-18 ENCOUNTER — Other Ambulatory Visit: Payer: Self-pay | Admitting: *Deleted

## 2012-04-18 NOTE — Telephone Encounter (Signed)
Opened in Error.

## 2012-06-27 ENCOUNTER — Other Ambulatory Visit: Payer: Self-pay | Admitting: Cardiovascular Disease

## 2012-06-27 NOTE — Telephone Encounter (Signed)
Fax Received. Refill Completed. Yeraldy Spike Chowoe (R.M.A)   

## 2012-09-12 ENCOUNTER — Other Ambulatory Visit: Payer: Self-pay | Admitting: *Deleted

## 2012-09-12 MED ORDER — TRIAMTERENE-HCTZ 37.5-25 MG PO CAPS: 1.0000 | ORAL_CAPSULE | ORAL | Status: DC

## 2012-09-12 NOTE — Telephone Encounter (Signed)
Fax Received. Refill Completed. [name] [name] (R.M.A)   

## 2012-09-19 ENCOUNTER — Encounter: Payer: Self-pay | Admitting: Cardiovascular Disease

## 2012-09-19 ENCOUNTER — Ambulatory Visit (INDEPENDENT_AMBULATORY_CARE_PROVIDER_SITE_OTHER): Payer: Medicare Other | Admitting: Cardiovascular Disease

## 2012-09-19 VITALS — BP 140/94 | HR 90 | Ht 70.0 in | Wt 204.4 lb

## 2012-09-19 DIAGNOSIS — I10 Essential (primary) hypertension: Secondary | ICD-10-CM

## 2012-09-19 DIAGNOSIS — E785 Hyperlipidemia, unspecified: Secondary | ICD-10-CM

## 2012-09-19 DIAGNOSIS — I2510 Atherosclerotic heart disease of native coronary artery without angina pectoris: Secondary | ICD-10-CM

## 2012-09-19 NOTE — Patient Instructions (Addendum)
Your physician recommends that you return for a FASTING lipid profile: today and in 6 months   Your physician wants you to follow-up in: 6 months You will receive a reminder letter in the mail two months in advance. If you don't receive a letter, please call our office to schedule the follow-up appointment.    GAVE SCRIPT TO HAVE LABS DONE WITH PCP AND TO BE FAXED

## 2012-09-19 NOTE — Progress Notes (Signed)
[name] [name] Date of Birth  02/18/1935       [facility]    [place] [address]  [address] [place], [place]  [address]   [place], [place]  [address] [phone]     [phone]   Fax  [phone]    Fax [phone]  Problem List: 1. Hypertension 2. Orthostatic hypotension 3. Hypercholesterolemia 4. Prostate cancer 5. Dyslipidemia  History of Present Illness: [name] [name] is a 77 year old gentleman with a history of hypertension. Also has intermittent episodes of orthostatic hypotension. He also has a history of hypercholesterolemia and history of prostate cancer.  [name] [name] has had problems with fatigue.  He is limited by his arthritis and back pain.  He denies any chest pain or dyspnea.  He wakes up gasping for air frequently.  His symptoms sound like sleep apnea. He has lots of postnasal drip and also has asthma.  He did not take his blood pressure medicines today because he's fasting. This may explain his mild blood pressure elevation.   Current Outpatient Prescriptions on File Prior to Visit  Medication Sig Dispense Refill  . albuterol (PROVENTIL) (2.5 MG/3ML) 0.083% nebulizer solution Take 2.5 mg by nebulization every 6 (six) hours as needed.        . CRESTOR 5 MG tablet TAKE 1 TABLET BY MOUTH ATBEDTIME  90 tablet  2  . finasteride (PROSCAR) 5 MG tablet Take 5 mg by mouth daily.        . IBUPROFEN PO Take by mouth as needed.        Marland Kitchen losartan (COZAAR) 50 MG tablet TAKE 1 TABLET (50 MG TOTAL) BY MOUTH DAILY.  90 tablet  2  . metoprolol tartrate (LOPRESSOR) 25 MG tablet Take 0.5 tablets (12.5 mg total) by mouth 2 (two) times daily.  180 tablet  5  . nitroGLYCERIN (NITROSTAT) 0.4 MG SL tablet Place 1 tablet (0.4 mg total) under the tongue every 5 (five) minutes as needed (up to three doses, if pain continues call 911).  25 tablet  11  . triamterene-hydrochlorothiazide (DYAZIDE) 37.5-25 MG per capsule Take 1 each (1 capsule total) by  mouth every morning.  90 capsule  2    Allergies  Allergen Reactions  . Fluticasone-Salmeterol     REACTION: sensation of throat closing  . Lisinopril   . Penicillins   . Tetracyclines & Related     Past Medical History  Diagnosis Date  . Hypertension   . Hyperlipidemia   . History of orthostatic hypotension   . Prostate cancer   . BPH (benign prostatic hypertrophy)     No past surgical history on file.  History  Smoking status  . Current Some Day Smoker  . Types: Pipe  Smokeless tobacco  . Not on file    History  Alcohol Use No    Family History  Problem Relation Age of Onset  . Heart failure Mother     Reviw of Systems:  Reviewed in the HPI.  All other systems are negative.  Physical Exam: Blood pressure 140/94, pulse 90, height 5\' 10"  (1.778 m), weight 204 lb 6.4 oz (92.715 kg), SpO2 97.00%. General: Well developed, well nourished, in no acute distress.  Head: Normocephalic, atraumatic, sclera non-icteric, mucus membranes are moist,   Neck: Supple. Carotids are 2 + without bruits. No JVD  Lungs: Clear bilaterally to auscultation.  Heart: regular rate.  normal  S1 S2. No murmurs, gallops or rubs.  Abdomen: Soft,  non-tender, non-distended with normal bowel sounds. No hepatomegaly. No rebound/guarding. No masses.  Msk:  Strength and tone are normal  Extremities: No clubbing or cyanosis. No edema.  Distal pedal pulses are 2+ and equal bilaterally.  Neuro: Alert and oriented X 3. Moves all extremities spontaneously.  Psych:  Responds to questions appropriately with a normal affect.  ECG:  Assessment / Plan:

## 2012-09-19 NOTE — Assessment & Plan Note (Signed)
Overall his blood pressure remains fairly well controlled. He typically is normal. It is elevated a little bit today because he has not taken his antihypertensive medications. I've encouraged him to work on a good diet and exercise program. He is to ask his medical doctor about sleep apnea. If he has sleep apnea this would also be another cause for his elevated blood pressure.  I'll see him again in 6 months. We'll check a fasting labs and an EKG at that time.

## 2012-09-21 DIAGNOSIS — N529 Male erectile dysfunction, unspecified: Secondary | ICD-10-CM | POA: Insufficient documentation

## 2012-09-21 HISTORY — DX: Male erectile dysfunction, unspecified: N52.9

## 2012-09-22 DIAGNOSIS — R339 Retention of urine, unspecified: Secondary | ICD-10-CM | POA: Insufficient documentation

## 2012-10-25 ENCOUNTER — Encounter: Payer: Self-pay | Admitting: Cardiovascular Disease

## 2012-12-15 ENCOUNTER — Other Ambulatory Visit: Payer: Self-pay | Admitting: *Deleted

## 2012-12-15 MED ORDER — LOSARTAN POTASSIUM 50 MG PO TABS: 50.0000 mg | ORAL_TABLET | Freq: Every day | ORAL | Status: DC

## 2013-03-17 ENCOUNTER — Ambulatory Visit (INDEPENDENT_AMBULATORY_CARE_PROVIDER_SITE_OTHER): Payer: Medicare Other | Admitting: Cardiovascular Disease

## 2013-03-17 ENCOUNTER — Encounter: Payer: Self-pay | Admitting: Cardiovascular Disease

## 2013-03-17 ENCOUNTER — Telehealth: Payer: Self-pay | Admitting: *Deleted

## 2013-03-17 ENCOUNTER — Other Ambulatory Visit: Payer: Medicare Other

## 2013-03-17 VITALS — BP 128/80 | HR 80 | Ht 70.0 in | Wt 210.0 lb

## 2013-03-17 DIAGNOSIS — I4510 Unspecified right bundle-branch block: Secondary | ICD-10-CM

## 2013-03-17 DIAGNOSIS — I10 Essential (primary) hypertension: Secondary | ICD-10-CM

## 2013-03-17 DIAGNOSIS — E785 Hyperlipidemia, unspecified: Secondary | ICD-10-CM

## 2013-03-17 DIAGNOSIS — I2510 Atherosclerotic heart disease of native coronary artery without angina pectoris: Secondary | ICD-10-CM

## 2013-03-17 HISTORY — DX: Unspecified right bundle-branch block: I45.10

## 2013-03-17 LAB — LIPID PANEL
Cholesterol: 140 mg/dL (ref 0–200)
HDL: 34.9 mg/dL — ABNORMAL LOW (ref >39.00)
LDL Cholesterol: 75 mg/dL (ref 0–99)
Total CHOL/HDL Ratio: 4
Triglycerides: 152 mg/dL — ABNORMAL HIGH (ref 0.0–149.0)
VLDL: 30.4 mg/dL (ref 0.0–40.0)

## 2013-03-17 LAB — HEPATIC FUNCTION PANEL
ALT: 17 U/L (ref 0–53)
AST: 16 U/L (ref 0–37)
Albumin: 4 g/dL (ref 3.5–5.2)
Alkaline Phosphatase: 53 U/L (ref 39–117)
Bilirubin, Direct: 0.1 mg/dL (ref 0.0–0.3)
Total Bilirubin: 0.5 mg/dL (ref 0.3–1.2)
Total Protein: 7.4 g/dL (ref 6.0–8.3)

## 2013-03-17 LAB — BASIC METABOLIC PANEL
BUN: 17 mg/dL (ref 6–23)
CO2: 29 mEq/L (ref 19–32)
Calcium: 9.7 mg/dL (ref 8.4–10.5)
Chloride: 103 mEq/L (ref 96–112)
Creatinine, Ser: 0.8 mg/dL (ref 0.4–1.5)
GFR: 101.25 mL/min (ref >60.00)
Glucose, Bld: 133 mg/dL — ABNORMAL HIGH (ref 70–99)
Potassium: 4.2 mEq/L (ref 3.5–5.1)
Sodium: 138 mEq/L (ref 135–145)

## 2013-03-17 NOTE — Telephone Encounter (Signed)
Pt informed and was sent low glycemic diet.

## 2013-03-17 NOTE — Assessment & Plan Note (Addendum)
He has developed a right bundle branch block since I last saw him. He also has a first degree AV block and has left axis deviation. We'll continue to watch closely for the development of further AV block. We'll discontinue the metoprolol at this point. I see him in 6 months. We'll get an EKG at that time.

## 2013-03-17 NOTE — Progress Notes (Signed)
[name] [name] Date of Birth  1934-12-26       [facility]    [place] [address]  [address] [place], [place]  [address]   [place], [place]  [address] [phone]     [phone]   Fax  [phone]    Fax [phone]  Problem List: 1. Hypertension 2. Orthostatic hypotension 3. Hypercholesterolemia 4. Prostate cancer 5. Dyslipidemia  History of Present Illness: [name] [name] is a 78 year old gentleman with a history of hypertension. Also has intermittent episodes of orthostatic hypotension. He also has a history of hypercholesterolemia and history of prostate cancer.  [name] [name] has had problems with fatigue.  He is limited by his arthritis and back pain.  He denies any chest pain or dyspnea.  He wakes up gasping for air frequently.  His symptoms sound like sleep apnea. He has lots of postnasal drip and also has asthma. He did not take his blood pressure medicines today because he's fasting. This may explain his mild blood pressure elevation.  Mar 17, 2013:  [name] [name] is doing OK from a cardiac standpoint.  He is having some asthma problems.  No CP.   He quit smoking his pipe.  His BP is typically well controlled - it is a bit high today b/c he has not taken his meds yet this am.    Current Outpatient Prescriptions on File Prior to Visit  Medication Sig Dispense Refill  . albuterol (PROVENTIL) (2.5 MG/3ML) 0.083% nebulizer solution Take 2.5 mg by nebulization every 6 (six) hours as needed.        . CRESTOR 5 MG tablet TAKE 1 TABLET BY MOUTH ATBEDTIME  90 tablet  2  . finasteride (PROSCAR) 5 MG tablet Take 5 mg by mouth daily.        . IBUPROFEN PO Take by mouth as needed.        Marland Kitchen losartan (COZAAR) 50 MG tablet Take 1 tablet (50 mg total) by mouth daily.  90 tablet  3  . metoprolol tartrate (LOPRESSOR) 25 MG tablet Take 0.5 tablets (12.5 mg total) by mouth 2 (two) times daily.  180 tablet  5  . nitroGLYCERIN (NITROSTAT) 0.4 MG SL tablet Place 1  tablet (0.4 mg total) under the tongue every 5 (five) minutes as needed (up to three doses, if pain continues call 911).  25 tablet  11  . triamterene-hydrochlorothiazide (DYAZIDE) 37.5-25 MG per capsule Take 1 each (1 capsule total) by mouth every morning.  90 capsule  2   No current facility-administered medications on file prior to visit.    Allergies  Allergen Reactions  . Fluticasone-Salmeterol     REACTION: sensation of throat closing  . Lisinopril   . Penicillins   . Tetracyclines & Related     Past Medical History  Diagnosis Date  . Hypertension   . Hyperlipidemia   . History of orthostatic hypotension   . Prostate cancer   . BPH (benign prostatic hypertrophy)     No past surgical history on file.  History  Smoking status  . Current Some Day Smoker  . Types: Pipe  Smokeless tobacco  . Not on file    History  Alcohol Use No    Family History  Problem Relation Age of Onset  . Heart failure Mother     Reviw of Systems:  Reviewed in the HPI.  All other systems are negative.  Physical Exam: Blood pressure 144/91, pulse 80, height 5\' 10"  (  1.778 m), weight 210 lb (95.255 kg). General: Well developed, well nourished, in no acute distress.  Head: Normocephalic, atraumatic, sclera non-icteric, mucus membranes are moist,   Neck: Supple. Carotids are 2 + without bruits. No JVD  Lungs: Clear bilaterally to auscultation.  Heart: regular rate.  normal  S1 S2. No murmurs, gallops or rubs.  Abdomen: Soft, non-tender, non-distended with normal bowel sounds. No hepatomegaly. No rebound/guarding. No masses.  Msk:  Strength and tone are normal  Extremities: No clubbing or cyanosis. No edema.  Distal pedal pulses are 2+ and equal bilaterally.  Neuro: Alert and oriented X 3. Moves all extremities spontaneously.  Psych:  Responds to questions appropriately with a normal affect.  ECG: Mar 17, 2013: Normal sinus rhythm at 80 beats a minute. He has a first degree AV  block. He has a right bundle branch block which is new. There is left axis deviation.  Assessment / Plan:

## 2013-03-17 NOTE — Patient Instructions (Addendum)
Your physician wants you to follow-up in: 6 months with ekg,  You will receive a reminder letter in the mail two months in advance. If you don't receive a letter, please call our office to schedule the follow-up appointment.  Your physician recommends that you return for a FASTING lipid profile: bmet lipid hepatic   Your physician has recommended you make the following change in your medication:  Stop metoprolol

## 2013-03-17 NOTE — Telephone Encounter (Signed)
Message copied by Antony Odea on Fri Mar 17, 2013  4:50 PM ------      Message from: Vesta Mixer      Created: Fri Mar 17, 2013  3:35 PM       Labs are great       ------

## 2013-03-17 NOTE — Assessment & Plan Note (Signed)
[name] [name] seems to be doing well. His blood pressure is well-controlled. He's been on metoprolol for quite some time. He has developed a right bundle branch block. He also has a first-degree AV block and a left axis deviation. I'm worried that he may be developing worsening heart block and may develop trifascicular block.  At this point we will discontinue the metoprolol. I have encouraged him to exercise on a regular basis. I'll see him in 6 months. We'll get an EKG at that time.  We'll check labs today including lipid profile, basic metabolic profile, and liver enzymes.

## 2013-03-21 LAB — PULMONARY FUNCTION TEST

## 2013-03-22 DIAGNOSIS — N320 Bladder-neck obstruction: Secondary | ICD-10-CM | POA: Insufficient documentation

## 2013-03-22 HISTORY — DX: Bladder-neck obstruction: N32.0

## 2013-03-28 ENCOUNTER — Other Ambulatory Visit: Payer: Self-pay

## 2013-03-28 ENCOUNTER — Emergency Department (HOSPITAL_COMMUNITY): Payer: Medicare Other

## 2013-03-28 ENCOUNTER — Encounter (HOSPITAL_COMMUNITY): Payer: Self-pay | Admitting: Emergency Medicine

## 2013-03-28 ENCOUNTER — Emergency Department (HOSPITAL_COMMUNITY)
Admission: EM | Admit: 2013-03-28 | Discharge: 2013-03-28 | Disposition: A | Discharge status: Home / Self Care | Payer: Medicare Other | Attending: Emergency Medicine | Admitting: Emergency Medicine

## 2013-03-28 DIAGNOSIS — Z79899 Other long term (current) drug therapy: Secondary | ICD-10-CM | POA: Insufficient documentation

## 2013-03-28 DIAGNOSIS — Z880 Allergy status to penicillin: Secondary | ICD-10-CM | POA: Insufficient documentation

## 2013-03-28 DIAGNOSIS — Z8546 Personal history of malignant neoplasm of prostate: Secondary | ICD-10-CM | POA: Insufficient documentation

## 2013-03-28 DIAGNOSIS — I252 Old myocardial infarction: Secondary | ICD-10-CM | POA: Insufficient documentation

## 2013-03-28 DIAGNOSIS — N400 Enlarged prostate without lower urinary tract symptoms: Secondary | ICD-10-CM | POA: Insufficient documentation

## 2013-03-28 DIAGNOSIS — Z8679 Personal history of other diseases of the circulatory system: Secondary | ICD-10-CM | POA: Insufficient documentation

## 2013-03-28 DIAGNOSIS — I10 Essential (primary) hypertension: Secondary | ICD-10-CM | POA: Insufficient documentation

## 2013-03-28 DIAGNOSIS — F17200 Nicotine dependence, unspecified, uncomplicated: Secondary | ICD-10-CM | POA: Insufficient documentation

## 2013-03-28 DIAGNOSIS — J4521 Mild intermittent asthma with (acute) exacerbation: Secondary | ICD-10-CM

## 2013-03-28 DIAGNOSIS — E785 Hyperlipidemia, unspecified: Secondary | ICD-10-CM | POA: Insufficient documentation

## 2013-03-28 DIAGNOSIS — J45901 Unspecified asthma with (acute) exacerbation: Secondary | ICD-10-CM | POA: Insufficient documentation

## 2013-03-28 LAB — BASIC METABOLIC PANEL
BUN: 19 mg/dL (ref 6–23)
CO2: 27 mEq/L (ref 19–32)
Calcium: 9.5 mg/dL (ref 8.4–10.5)
Chloride: 99 mEq/L (ref 96–112)
Creatinine, Ser: 0.76 mg/dL (ref 0.50–1.35)
GFR calc Af Amer: >90 mL/min (ref >90)
GFR calc non Af Amer: 86 mL/min — ABNORMAL LOW (ref >90)
Glucose, Bld: 215 mg/dL — ABNORMAL HIGH (ref 70–99)
Potassium: 2.9 mEq/L — ABNORMAL LOW (ref 3.5–5.1)
Sodium: 136 mEq/L (ref 135–145)

## 2013-03-28 LAB — CBC WITH DIFFERENTIAL/PLATELET
Basophils Absolute: 0.1 10*3/uL (ref 0.0–0.1)
Basophils Relative: 1 % (ref 0–1)
Eosinophils Absolute: 0.5 10*3/uL (ref 0.0–0.7)
Eosinophils Relative: 5 % (ref 0–5)
HCT: 37.3 % — ABNORMAL LOW (ref 39.0–52.0)
Hemoglobin: 13.1 g/dL (ref 13.0–17.0)
Lymphocytes Relative: 23 % (ref 12–46)
Lymphs Abs: 2.1 10*3/uL (ref 0.7–4.0)
MCH: 30 pg (ref 26.0–34.0)
MCHC: 35.1 g/dL (ref 30.0–36.0)
MCV: 85.6 fL (ref 78.0–100.0)
Monocytes Absolute: 0.6 10*3/uL (ref 0.1–1.0)
Monocytes Relative: 7 % (ref 3–12)
Neutro Abs: 5.8 10*3/uL (ref 1.7–7.7)
Neutrophils Relative %: 64 % (ref 43–77)
Platelets: 209 10*3/uL (ref 150–400)
RBC: 4.36 MIL/uL (ref 4.22–5.81)
RDW: 13.1 % (ref 11.5–15.5)
WBC: 9 10*3/uL (ref 4.0–10.5)

## 2013-03-28 LAB — POCT I-STAT TROPONIN I: Troponin i, poc: 0 ng/mL (ref 0.00–0.08)

## 2013-03-28 MED ORDER — IPRATROPIUM BROMIDE 0.02 % IN SOLN
0.5000 mg | Freq: Once | RESPIRATORY_TRACT | Status: AC
  Administered 2013-03-28: 0.5 mg via RESPIRATORY_TRACT
  Filled 2013-03-28 (×2): qty 2.5

## 2013-03-28 MED ORDER — METHYLPREDNISOLONE SODIUM SUCC 125 MG IJ SOLR
125.0000 mg | Freq: Once | INTRAMUSCULAR | Status: AC
  Administered 2013-03-28: 125 mg via INTRAVENOUS
  Filled 2013-03-28: qty 2

## 2013-03-28 MED ORDER — ALBUTEROL SULFATE HFA 108 (90 BASE) MCG/ACT IN AERS: 2.0000 | INHALATION_SPRAY | RESPIRATORY_TRACT | Status: DC

## 2013-03-28 MED ORDER — ALBUTEROL SULFATE (5 MG/ML) 0.5% IN NEBU
2.5000 mg | INHALATION_SOLUTION | Freq: Once | RESPIRATORY_TRACT | Status: AC
  Administered 2013-03-28: 2.5 mg via RESPIRATORY_TRACT
  Filled 2013-03-28 (×2): qty 0.5

## 2013-03-28 MED ORDER — PREDNISONE 20 MG PO TABS: 60.0000 mg | ORAL_TABLET | Freq: Every day | ORAL | Status: DC

## 2013-03-28 MED ORDER — BENZONATATE 100 MG PO CAPS: 200.0000 mg | ORAL_CAPSULE | Freq: Three times a day (TID) | ORAL | Status: DC | PRN

## 2013-03-28 NOTE — ED Notes (Signed)
Per ems-- pt reports sob all day increasingly getting worse. States has seasonal asthma. Pt took 2 puffs of home inhaler. ems administered 5mg  albuterol. Lung sounds rhonchi and coughing up frothy sputum. Denies fever. 20 G IV right hand.

## 2013-03-28 NOTE — ED Provider Notes (Signed)
History     CSN: [id number]  Arrival date & time 03/28/13  0017   First MD Initiated Contact with Patient 03/28/13 0020      Chief Complaint  Patient presents with  . Shortness of Breath    (Consider location/radiation/quality/duration/timing/severity/associated sxs/prior treatment) HPI 78 year old presents emergency department complaining of progressive shortness of breath, cough, productive of white frothy sputum.  He has history of mild asthma.  He has been using his albuterol inhaler at home without improvement.  No fever no chills.  No chest pain.  No history of congestive heart failure.  Patient was seen by his cardiologist.  Last week and given, that they'll help.  He reports past pulmonary function tests have been normal  Past Medical History  Diagnosis Date  . Hypertension   . Hyperlipidemia   . History of orthostatic hypotension   . Prostate cancer   . BPH (benign prostatic hypertrophy)   . MI (myocardial infarction)     Past Surgical History  Procedure Laterality Date  . Appendectomy    . Hernia repair    . Vasectomy      Family History  Problem Relation Age of Onset  . Heart failure Mother     History  Substance Use Topics  . Smoking status: Current Some Day Smoker    Types: Pipe  . Smokeless tobacco: Not on file  . Alcohol Use: No      Review of Systems  All other systems reviewed and are negative.    Allergies  Fluticasone-salmeterol; Lisinopril; Penicillins; and Tetracyclines & related  Home Medications   Current Outpatient Rx  Name  Route  Sig  Dispense  Refill  . cetirizine (ZYRTEC) 10 MG tablet   Oral   Take 10 mg by mouth daily.         . famotidine (PEPCID) 20 MG tablet   Oral   Take 20 mg by mouth 2 (two) times daily.         . finasteride (PROSCAR) 5 MG tablet   Oral   Take 5 mg by mouth daily.           . fluticasone (FLONASE) 50 MCG/ACT nasal spray   Nasal   Place 2 sprays into the nose daily.          Marland Kitchen  ibuprofen (ADVIL,MOTRIN) 200 MG tablet   Oral   Take 600 mg by mouth every 6 (six) hours as needed for pain.         Marland Kitchen losartan (COZAAR) 50 MG tablet   Oral   Take 1 tablet (50 mg total) by mouth daily.   90 tablet   3   . rosuvastatin (CRESTOR) 5 MG tablet   Oral   Take 5 mg by mouth daily.         Marland Kitchen triamterene-hydrochlorothiazide (DYAZIDE) 37.5-25 MG per capsule   Oral   Take 1 each (1 capsule total) by mouth every morning.   90 capsule   2   . albuterol (PROVENTIL HFA;VENTOLIN HFA) 108 (90 BASE) MCG/ACT inhaler   Inhalation   Inhale 2 puffs into the lungs every 4 (four) hours while awake.   1 Inhaler   0   . benzonatate (TESSALON) 100 MG capsule   Oral   Take 2 capsules (200 mg total) by mouth 3 (three) times daily as needed for cough.   21 capsule   0   . nitroGLYCERIN (NITROSTAT) 0.4 MG SL tablet   Sublingual   Place 1  tablet (0.4 mg total) under the tongue every 5 (five) minutes as needed (up to three doses, if pain continues call 911).   25 tablet   11   . predniSONE (DELTASONE) 20 MG tablet   Oral   Take 3 tablets (60 mg total) by mouth daily.   15 tablet   0     BP 113/75  Pulse 91  Temp(Src) 97.8 F (36.6 C) (Oral)  Resp 21  SpO2 94%  Physical Exam  Nursing note and vitals reviewed. Constitutional: He is oriented to person, place, and time. He appears well-developed and well-nourished.  HENT:  Head: Normocephalic and atraumatic.  Nose: Nose normal.  Mouth/Throat: Oropharynx is clear and moist.  Eyes: Conjunctivae and EOM are normal. Pupils are equal, round, and reactive to light.  Neck: Normal range of motion. Neck supple. No JVD present. No tracheal deviation present. No thyromegaly present.  Cardiovascular: Normal rate, regular rhythm, normal heart sounds and intact distal pulses.  Exam reveals no gallop and no friction rub.   No murmur heard. Pulmonary/Chest: Effort normal. No stridor. No respiratory distress. He has wheezes. He has no  rales. He exhibits no tenderness.  Abdominal: Soft. Bowel sounds are normal. He exhibits no distension and no mass. There is no tenderness. There is no rebound and no guarding.  Musculoskeletal: Normal range of motion. He exhibits no edema and no tenderness.  Lymphadenopathy:    He has no cervical adenopathy.  Neurological: He is alert and oriented to person, place, and time. He exhibits normal muscle tone. Coordination normal.  Skin: Skin is warm and dry. No rash noted. No erythema. No pallor.  Psychiatric: He has a normal mood and affect. His behavior is normal. Judgment and thought content normal.    ED Course  Procedures (including critical care time)  Labs Reviewed  CBC WITH DIFFERENTIAL - Abnormal; Notable for the following:    HCT 37.3 (*)    All other components within normal limits  BASIC METABOLIC PANEL - Abnormal; Notable for the following:    Potassium 2.9 (*)    Glucose, Bld 215 (*)    GFR calc non Af Amer 86 (*)    All other components within normal limits  POCT I-STAT TROPONIN I   Dg Chest 2 View  03/28/2013   *RADIOLOGY REPORT*  Clinical Data: Cough, congestion and shortness of breath.  CHEST - 2 VIEW  Comparison: 01/02/2009  Findings: The lungs are clear.  There are scattered areas of parenchymal scarring and atelectasis which are relatively stable. No edema, focal infiltrate, nodule or pleural fluid is identified. Heart size and mediastinal contours are within normal limits.  The bony thorax is unremarkable.  IMPRESSION: No active disease.   Original Report Authenticated By: Irish Lack, M.D.    Date: 03/28/2013  Rate: 111  Rhythm: sinus tachycardia and premature ventricular contractions (PVC)  QRS Axis: left  Intervals: normal  ST/T Wave abnormalities: normal  Conduction Disutrbances:right bundle branch block  Narrative Interpretation:   Old EKG Reviewed: unchanged    1. Asthma exacerbation, mild intermittent       MDM  78 year old male with acute  wheezing.  Over one day.  Workup unremarkable.  Upon discharge, patient reports he is unable take prednisone.  We'll have him discuss steroid use with his primary care Dr.        Olivia Mackie, MD 03/28/13 [phone]

## 2013-03-28 NOTE — ED Notes (Signed)
Pt given coffee-- okay by Dr. Norlene Campbell.

## 2013-05-03 ENCOUNTER — Other Ambulatory Visit: Payer: Self-pay | Admitting: *Deleted

## 2013-05-03 MED ORDER — ROSUVASTATIN CALCIUM 5 MG PO TABS: 5.0000 mg | ORAL_TABLET | Freq: Every day | ORAL | Status: DC

## 2013-05-03 NOTE — Telephone Encounter (Signed)
Refill sent.

## 2013-05-03 NOTE — Telephone Encounter (Signed)
Fax Received. Refill Completed. [name] [name] (R.M.A)   

## 2013-05-08 ENCOUNTER — Telehealth: Payer: Self-pay | Admitting: Cardiovascular Disease

## 2013-05-08 DIAGNOSIS — I10 Essential (primary) hypertension: Secondary | ICD-10-CM

## 2013-05-08 DIAGNOSIS — R252 Cramp and spasm: Secondary | ICD-10-CM

## 2013-05-08 NOTE — Telephone Encounter (Signed)
Pt c/o leg cramps x 2 months, meds reviewed no new meds. Pt was in ED 2 months ago and had a low k+, pt can not come today for lab draw even after explaining how important lab draw is, app made for tomorrow morning and I sent them to Norma Fredrickson NP in Dr Harvie Bridge absence. Told pt to call in afternoon and ask for triage if he doesn't get results. Agreed to plan.

## 2013-05-08 NOTE — Telephone Encounter (Signed)
New Prob  Pt states he is having leg cramps and wants to speak with a nurse.

## 2013-05-09 ENCOUNTER — Other Ambulatory Visit: Payer: Self-pay | Admitting: *Deleted

## 2013-05-09 ENCOUNTER — Other Ambulatory Visit (INDEPENDENT_AMBULATORY_CARE_PROVIDER_SITE_OTHER): Payer: Medicare Other

## 2013-05-09 DIAGNOSIS — R252 Cramp and spasm: Secondary | ICD-10-CM

## 2013-05-09 DIAGNOSIS — I10 Essential (primary) hypertension: Secondary | ICD-10-CM

## 2013-05-09 DIAGNOSIS — E876 Hypokalemia: Secondary | ICD-10-CM

## 2013-05-09 LAB — BASIC METABOLIC PANEL
BUN: 14 mg/dL (ref 6–23)
CO2: 27 mEq/L (ref 19–32)
Calcium: 9.2 mg/dL (ref 8.4–10.5)
Chloride: 101 mEq/L (ref 96–112)
Creatinine, Ser: 0.8 mg/dL (ref 0.4–1.5)
GFR: 94.29 mL/min (ref >60.00)
Glucose, Bld: 182 mg/dL — ABNORMAL HIGH (ref 70–99)
Potassium: 3.2 mEq/L — ABNORMAL LOW (ref 3.5–5.1)
Sodium: 136 mEq/L (ref 135–145)

## 2013-05-09 MED ORDER — POTASSIUM CHLORIDE CRYS ER 20 MEQ PO TBCR: 20.0000 meq | EXTENDED_RELEASE_TABLET | Freq: Every day | ORAL | Status: DC

## 2013-05-09 NOTE — Telephone Encounter (Signed)
Lab results reviewed by Norma Fredrickson NP and pt contacted by Brien Mates.

## 2013-05-16 ENCOUNTER — Other Ambulatory Visit: Payer: Self-pay | Admitting: *Deleted

## 2013-05-16 ENCOUNTER — Other Ambulatory Visit (INDEPENDENT_AMBULATORY_CARE_PROVIDER_SITE_OTHER): Payer: Medicare Other

## 2013-05-16 DIAGNOSIS — E876 Hypokalemia: Secondary | ICD-10-CM

## 2013-05-16 LAB — BASIC METABOLIC PANEL
BUN: 14 mg/dL (ref 6–23)
CO2: 27 mEq/L (ref 19–32)
Calcium: 9.2 mg/dL (ref 8.4–10.5)
Chloride: 100 mEq/L (ref 96–112)
Creatinine, Ser: 0.9 mg/dL (ref 0.4–1.5)
GFR: 87.07 mL/min (ref >60.00)
Glucose, Bld: 121 mg/dL — ABNORMAL HIGH (ref 70–99)
Potassium: 3.3 mEq/L — ABNORMAL LOW (ref 3.5–5.1)
Sodium: 135 mEq/L (ref 135–145)

## 2013-05-24 ENCOUNTER — Other Ambulatory Visit: Payer: Self-pay | Admitting: *Deleted

## 2013-05-24 DIAGNOSIS — E876 Hypokalemia: Secondary | ICD-10-CM

## 2013-05-24 MED ORDER — POTASSIUM CHLORIDE CRYS ER 20 MEQ PO TBCR: 20.0000 meq | EXTENDED_RELEASE_TABLET | Freq: Two times a day (BID) | ORAL | Status: DC

## 2013-05-24 NOTE — Telephone Encounter (Signed)
error 

## 2013-05-24 NOTE — Telephone Encounter (Signed)
Patient calling to have correct rx sent to [facility] b/c insurance will not let him refill his next rx that states once daily instead of twice daily. Patient was notified of sent rx.   Micki Riley, CMA

## 2013-05-30 ENCOUNTER — Other Ambulatory Visit (INDEPENDENT_AMBULATORY_CARE_PROVIDER_SITE_OTHER): Payer: Medicare Other

## 2013-05-30 DIAGNOSIS — E876 Hypokalemia: Secondary | ICD-10-CM

## 2013-05-30 LAB — BASIC METABOLIC PANEL
BUN: 14 mg/dL (ref 6–23)
CO2: 26 mEq/L (ref 19–32)
Calcium: 9.1 mg/dL (ref 8.4–10.5)
Chloride: 102 mEq/L (ref 96–112)
Creatinine, Ser: 0.9 mg/dL (ref 0.4–1.5)
GFR: 83.83 mL/min (ref >60.00)
Glucose, Bld: 132 mg/dL — ABNORMAL HIGH (ref 70–99)
Potassium: 4 mEq/L (ref 3.5–5.1)
Sodium: 136 mEq/L (ref 135–145)

## 2013-06-05 ENCOUNTER — Other Ambulatory Visit: Payer: Self-pay | Admitting: Cardiovascular Disease

## 2013-06-05 NOTE — Telephone Encounter (Signed)
Fax Received. Refill Completed. [name] [name] (R.M.A)   

## 2013-06-21 ENCOUNTER — Encounter: Payer: Self-pay | Admitting: Cardiovascular Disease

## 2013-07-06 ENCOUNTER — Ambulatory Visit (INDEPENDENT_AMBULATORY_CARE_PROVIDER_SITE_OTHER): Payer: Medicare Other | Admitting: Emergency Medicine

## 2013-07-06 ENCOUNTER — Encounter: Payer: Self-pay | Admitting: Emergency Medicine

## 2013-07-06 VITALS — BP 110/80 | HR 88 | Ht 70.5 in | Wt 217.6 lb

## 2013-07-06 DIAGNOSIS — J45909 Unspecified asthma, uncomplicated: Secondary | ICD-10-CM

## 2013-07-06 MED ORDER — PANTOPRAZOLE SODIUM 40 MG PO TBEC: 40.0000 mg | DELAYED_RELEASE_TABLET | Freq: Every day | ORAL | Status: DC

## 2013-07-06 MED ORDER — BUDESONIDE-FORMOTEROL FUMARATE 160-4.5 MCG/ACT IN AERO: 2.0000 | INHALATION_SPRAY | Freq: Two times a day (BID) | RESPIRATORY_TRACT | Status: DC

## 2013-07-06 MED ORDER — ALBUTEROL SULFATE HFA 108 (90 BASE) MCG/ACT IN AERS: 2.0000 | INHALATION_SPRAY | RESPIRATORY_TRACT | Status: DC | PRN

## 2013-07-06 MED ORDER — ALBUTEROL SULFATE HFA 108 (90 BASE) MCG/ACT IN AERS: 2.0000 | INHALATION_SPRAY | Freq: Four times a day (QID) | RESPIRATORY_TRACT | Status: DC | PRN

## 2013-07-06 NOTE — Patient Instructions (Addendum)
We will perform a home sleep study Please continue zyrtec daily Increase your fluticasone nasal spray to 2 sprays each side twice a day. Do not take right before bedtime because it will drip to the back of your throat.  Stop pepcid for now Start protonix 40mg  daily until our next visit.  Follow with Dr Delton Coombes in 6 weeks or sooner if you have any problems

## 2013-07-06 NOTE — Progress Notes (Signed)
Subjective:    Patient ID: [name] [name], male    DOB: 1935-09-30, 78 y.o.   MRN: [id number]  HPI 78 yo man, former smoker (30 pk-yrs), HTN, CAD/MI, DM, OSA, dx with COPD/asthma in 2004 by Dr Particia Lather. At that time he was having trouble with allergies that were exacerbating breathing symptoms. In 6/14 he had an acute episode of dyspnea while sitting, had to call EMS, thought he improved with albuterol nebs. CXR and spirometry in June '14 look normal, although there may have been some curve to the flow-volume loop. He reports that his allergic sx have been bad since moving to [place] in the 80's. For the last yr, has been harder to manage > has episodes of jerking himself awake sleeping, snores loudly per wife, witnessed apneas. He has exertional SOB.   He is taking Symbicort bid, uses albuterol prn.    Review of Systems  Constitutional: Negative for fever and unexpected weight change.  HENT: Positive for sore throat and postnasal drip. Negative for ear pain, nosebleeds, congestion, rhinorrhea, sneezing, trouble swallowing, dental problem and sinus pressure.   Eyes: Negative for redness and itching.  Respiratory: Positive for shortness of breath. Negative for cough, chest tightness and wheezing.   Cardiovascular: Negative for palpitations and leg swelling.  Gastrointestinal: Negative for nausea and vomiting.  Genitourinary: Negative for dysuria.  Musculoskeletal: Negative for joint swelling.  Skin: Negative for rash.  Neurological: Positive for dizziness. Negative for headaches.  Hematological: Does not bruise/bleed easily.  Psychiatric/Behavioral: Positive for sleep disturbance. Negative for dysphoric mood. The patient is not nervous/anxious.     Past Medical History  Diagnosis Date  . Hypertension   . Hyperlipidemia   . History of orthostatic hypotension   . Prostate cancer   . BPH (benign prostatic hypertrophy)   . MI (myocardial infarction)   . Asthma   . Rheumatic fever   .  Diabetes   . Sleep apnea      Family History  Problem Relation Age of Onset  . Heart failure Mother      History   Social History  . Marital Status: Married    Spouse Name: N/A    Number of Children: 2  . Years of Education: N/A   Occupational History  . retired     Agricultural consultant for [employer]   Social History Main Topics  . Smoking status: Former Smoker -- 1.00 packs/day for 30 years    Types: Cigarettes, Pipe    Quit date: 10/19/2012  . Smokeless tobacco: Never Used     Comment: 1 pipe every 3 days, quit smoking cig 12 yrs ago 07/06/13  . Alcohol Use: No     Comment: no etoh in 12 years  . Drug Use: No  . Sexual Activity: Not on file   Other Topics Concern  . Not on file   Social History Narrative  . No narrative on file  Has lived all over the [place] Was in the [employer], no overseas duty.   Allergies  Allergen Reactions  . Fluticasone-Salmeterol     REACTION: sensation of throat closing  . Lisinopril Cough  . Penicillins Hives  . Prednisone     Per WU:[id number] agitation  . Tetracyclines & Related Hives     Outpatient Prescriptions Prior to Visit  Medication Sig Dispense Refill  . albuterol (PROVENTIL HFA;VENTOLIN HFA) 108 (90 BASE) MCG/ACT inhaler Inhale 2 puffs into the lungs every 4 (four) hours while awake.  1 Inhaler  0  .  benzonatate (TESSALON) 100 MG capsule Take 2 capsules (200 mg total) by mouth 3 (three) times daily as needed for cough.  21 capsule  0  . cetirizine (ZYRTEC) 10 MG tablet Take 10 mg by mouth daily.      . famotidine (PEPCID) 20 MG tablet Take 20 mg by mouth 2 (two) times daily.      . finasteride (PROSCAR) 5 MG tablet Take 5 mg by mouth daily.        . fluticasone (FLONASE) 50 MCG/ACT nasal spray Place 2 sprays into the nose daily.       Marland Kitchen ibuprofen (ADVIL,MOTRIN) 200 MG tablet Take 600 mg by mouth every 6 (six) hours as needed for pain.      Marland Kitchen losartan (COZAAR) 50 MG tablet Take 1 tablet (50 mg total) by mouth daily.  90 tablet  3  .  nitroGLYCERIN (NITROSTAT) 0.4 MG SL tablet Place 1 tablet (0.4 mg total) under the tongue every 5 (five) minutes as needed (up to three doses, if pain continues call 911).  25 tablet  11  . potassium chloride SA (K-DUR,KLOR-CON) 20 MEQ tablet Take 1 tablet (20 mEq total) by mouth 2 (two) times daily.  60 tablet  1  . predniSONE (DELTASONE) 20 MG tablet Take 3 tablets (60 mg total) by mouth daily.  15 tablet  0  . rosuvastatin (CRESTOR) 5 MG tablet Take 1 tablet (5 mg total) by mouth daily.  90 tablet  3  . triamterene-hydrochlorothiazide (DYAZIDE) 37.5-25 MG per capsule TAKE 1 CAPSULE BY MOUTH EVERY MORNING.  90 capsule  2   No facility-administered medications prior to visit.         Objective:   Physical Exam There were no vitals filed for this visit. Gen: Pleasant, well-nourished, in no distress,  normal affect  ENT: No lesions,  mouth clear,  oropharynx clear, no postnasal drip  Neck: No JVD, no TMG, no carotid bruits  Lungs: No use of accessory muscles, no dullness to percussion, clear without rales or rhonchi  Cardiovascular: RRR, heart sounds normal, no murmur or gallops, no peripheral edema  Abdomen: soft and NT, no HSM,  BS normal  Musculoskeletal: No deformities, no cyanosis or clubbing  Neuro: alert, non focal  Skin: Warm, no lesions or rashes       03/28/13 --  Comparison: 01/02/2009  Findings: The lungs are clear. There are scattered areas of  parenchymal scarring and atelectasis which are relatively stable.  No edema, focal infiltrate, nodule or pleural fluid is identified.  Heart size and mediastinal contours are within normal limits. The  bony thorax is unremarkable.  IMPRESSION:  No active disease.     Assessment & Plan:  ASTHMA Unclear how much of this is upper airway vs lower airway. Factors influencing appear to be OSA, allergic rhinitis, GERD.  - start protonix - increase fluticasone to bid, continue zyrtec - home sleep study - stop symbicort  as a potential UA irritant - consider methacholine in the future if unable to determine how much BD therapy he needs - rov 6 weeks.

## 2013-07-06 NOTE — Addendum Note (Signed)
Addended by: Orma Flaming D on: 07/06/2013 03:45 PM   Modules accepted: Orders

## 2013-07-06 NOTE — Assessment & Plan Note (Signed)
Unclear how much of this is upper airway vs lower airway. Factors influencing appear to be OSA, allergic rhinitis, GERD.  - start protonix - increase fluticasone to bid, continue zyrtec - home sleep study - stop symbicort as a potential UA irritant - consider methacholine in the future if unable to determine how much BD therapy he needs - rov 6 weeks.

## 2013-07-06 NOTE — Addendum Note (Signed)
Addended by: Orma Flaming D on: 07/06/2013 04:16 PM   Modules accepted: Orders, Medications

## 2013-07-13 ENCOUNTER — Encounter: Payer: Self-pay | Admitting: Dietician

## 2013-07-13 ENCOUNTER — Encounter: Payer: Medicare Other | Attending: Internal Medicine | Admitting: Dietician

## 2013-07-13 VITALS — Ht 70.0 in | Wt 219.9 lb

## 2013-07-13 DIAGNOSIS — E119 Type 2 diabetes mellitus without complications: Secondary | ICD-10-CM

## 2013-07-13 DIAGNOSIS — Z713 Dietary counseling and surveillance: Secondary | ICD-10-CM | POA: Insufficient documentation

## 2013-07-13 NOTE — Progress Notes (Signed)
Patient was seen on 07/13/2013 for the first of a series of three diabetes self-management courses at [facility].   Current HbA1c: 6.9 on 09/14  The following learning objectives were met by the patient during this course:   Defines the role of glucose and insulin  Identifies type of diabetes and pathophysiology  Defines the diagnostic criteria for diabetes and prediabetes  States the risk factors for Type 2 Diabetes  States the symptoms of Type 2 Diabetes  Defines Type 2 Diabetes treatment goals  Defines Type 2 Diabetes treatment options  States the rationale for glucose monitoring  Identifies A1C, glucose targets, and testing times  Identifies proper sharps disposal  Defines the purpose of a diabetes food plan  Identifies carbohydrate food groups  Defines effects of carbohydrate foods on glucose levels  Identifies carbohydrate choices/grams/food labels  States benefits of physical activity and effect on glucose  Review of suggested activity guidelines  Handouts given during class include:  Type 2 Diabetes: Basics Book  My Food Plan Book  Food and Activity Log  Your patient has identified their diabetes self-care support plan as:  [facility] support group  Follow-Up Plan: Attend core 2 and core 3

## 2013-07-13 NOTE — Patient Instructions (Signed)
Goals:  Follow Diabetes Meal Plan as instructed  Eat 3 meals and 2 snacks, every 3-5 hrs  Limit carbohydrate intake to 45-60 grams carbohydrate/meal  Limit carbohydrate intake to 15-30 grams carbohydrate/snack  Add lean protein foods to meals/snacks  Monitor glucose levels as instructed by your doctor  Aim for 30 mins of physical activity daily  Bring food record and glucose log to your next nutrition visit 

## 2013-07-19 ENCOUNTER — Encounter: Payer: Self-pay | Admitting: Internal Medicine

## 2013-07-19 ENCOUNTER — Ambulatory Visit (INDEPENDENT_AMBULATORY_CARE_PROVIDER_SITE_OTHER): Payer: Medicare Other | Admitting: Internal Medicine

## 2013-07-19 VITALS — BP 132/76 | HR 110 | Temp 97.4°F | Ht 70.5 in | Wt 218.0 lb

## 2013-07-19 DIAGNOSIS — J45909 Unspecified asthma, uncomplicated: Secondary | ICD-10-CM

## 2013-07-19 NOTE — Patient Instructions (Addendum)
Pantoprazole (protonix) 40 mg   Take 30-60 min before first meal of the day and Pepcid 20 mg one bedtime and chlortrimeton 4 mg one at bedtime until return to office - this is the best way to tell whether stomach acid is contributing to your problem.    GERD (REFLUX)  is an extremely common cause of respiratory symptoms, many times with no significant heartburn at all.    It can be treated with medication, but also with lifestyle changes including avoidance of late meals, excessive alcohol, smoking cessation, and avoid fatty foods, chocolate, peppermint, colas, red wine, and acidic juices such as orange juice.  NO MINT OR MENTHOL PRODUCTS SO NO COUGH DROPS  USE SUGARLESS CANDY INSTEAD (jolley ranchers or Stover's)  NO OIL BASED VITAMINS - use powdered substitutes.    Please see patient coordinator before you leave today  to schedule methacholine test in 2 weeks

## 2013-07-19 NOTE — Progress Notes (Signed)
Subjective:    Patient ID: [name] [name], male    DOB: [birth year], 78 y.o.   MRN: [id number]  HPI 78 yo man, former smoker (30 pk-yrs), HTN, CAD/MI, DM, OSA, dx with COPD/asthma in 2004 by Dr Particia Lather. At that time he was having trouble with allergies that were exacerbating breathing symptoms. In 6/14 he had an acute episode of dyspnea while sitting, had to call EMS, thought he improved with albuterol nebs. CXR and spirometry in June '14 look normal, although there may have been some curve to the flow-volume loop. He reports that his allergic sx have been bad since moving to [place] in the 80's. For the last yr, has been harder to manage > has episodes of jerking himself awake sleeping, snores loudly per wife, witnessed apneas. He has exertional SOB.   He is taking Symbicort bid, uses albuterol prn.  rec - start protonix - increase fluticasone to bid, continue zyrtec - home sleep study - stop symbicort as a potential UA irritant - consider methacholine in the future if unable to determine how much BD therapy he needs - rov 6 weeks.   07/19/2013 f/u ov/Jaymeson Mengel re: sob x 15y  Comes and goes shortest = 45 mi to sev hours longest Chief Complaint  Patient presents with  . Acute Visit    Pt c/o increased SOB "can hardly breathe at all"- for years, but has been worse since last ov with RB on 07/06/13. He is using albuterol for rescue at least 5 times per day.   no pattern to sob, not definitely better p saba, assoc with sensation of choking more day than night and no assoc cough. Taking protonix before supper with overt hb at hs   No obvious pattern in day to day or daytime variabilty or assoc   cp or chest tightness, subjective wheeze overt sinus  symptoms. No unusual exp hx or h/o childhood pna/ asthma or knowledge of premature birth.  Sleeping ok without nocturnal  or early am exacerbation  of respiratory  c/o's or need for noct saba. Also denies any obvious fluctuation of symptoms with weather or  environmental changes or other aggravating or alleviating factors except as outlined above   Current Medications, Allergies, Complete Past Medical History, Past Surgical History, Family History, and Social History were reviewed in [employer] record.  ROS  The following are not active complaints unless bolded sore throat, dysphagia, dental problems, itching, sneezing,  nasal congestion or excess/ purulent secretions, ear ache,   fever, chills, sweats, unintended wt loss, pleuritic or exertional cp, hemoptysis,  orthopnea pnd or leg swelling, presyncope, palpitations, heartburn, abdominal pain, anorexia, nausea, vomiting, diarrhea  or change in bowel or urinary habits, change in stools or urine, dysuria,hematuria,  rash, arthralgias, visual complaints, headache, numbness weakness or ataxia or problems with walking or coordination,  change in mood/affect or memory.          Objective:   Physical Exam   Wt Readings from Last 3 Encounters:  07/19/13 218 lb (98.884 kg)  07/13/13 219 lb 14.4 oz (99.746 kg)  07/06/13 217 lb 9.6 oz (98.703 kg)       Gen: Pleasant, well-nourished, in no distress,  normal affect not having active spell at [facility]   ENT: No lesions,  mouth clear,  oropharynx clear, no postnasal drip  Neck: No JVD, no TMG, no carotid bruits  Lungs: No use of accessory muscles, no dullness to percussion, clear without rales or rhonchi  Cardiovascular: RRR,  heart sounds normal, no murmur or gallops, no peripheral edema  Abdomen: obese, soft and NT, no HSM,  BS normal  Musculoskeletal: No deformities, no cyanosis or clubbing  Neuro: alert, non focal  Skin: Warm, no lesions or rashes       03/28/13 --  Comparison: 01/02/2009  Findings: The lungs are clear. There are scattered areas of  parenchymal scarring and atelectasis which are relatively stable.  No edema, focal infiltrate, nodule or pleural fluid is identified.  Heart size and mediastinal  contours are within normal limits. The  bony thorax is unremarkable.  IMPRESSION:  No active disease.     Assessment & Plan:   .

## 2013-07-20 ENCOUNTER — Other Ambulatory Visit: Payer: Self-pay | Admitting: Internal Medicine

## 2013-07-20 DIAGNOSIS — J45909 Unspecified asthma, uncomplicated: Secondary | ICD-10-CM

## 2013-07-20 NOTE — Assessment & Plan Note (Signed)
Symptoms are markedly disproportionate to objective findings and not clear this is a lung problem but pt does appear to have difficult airway management issues. DDX of  difficult airways managment all start with A and  include Adherence, Ace Inhibitors, Acid Reflux, Active Sinus Disease, Alpha 1 Antitripsin deficiency, Anxiety masquerading as Airways dz,  ABPA,  allergy(esp in young), Aspiration (esp in elderly), Adverse effects of DPI,  Active smokers, plus two Bs  = Bronchiectasis and Beta blocker use..and one C= CHF  ? Acid (or non-acid) GERD > always difficult to exclude as up to 75% of pts in some series report no assoc GI/ Heartburn symptoms> rec max (24h)  acid suppression and diet restrictions/ reviewed and instructions given in writting   ? Anxiety/ panic disorder and asthma also in ddx > rec rx gerd x 2weeks then proceed with methacholine

## 2013-07-21 ENCOUNTER — Other Ambulatory Visit: Payer: Self-pay | Admitting: Cardiovascular Disease

## 2013-07-28 ENCOUNTER — Telehealth: Payer: Self-pay | Admitting: Internal Medicine

## 2013-07-28 NOTE — Telephone Encounter (Signed)
Spoke with the pt  He is asking what the MCT will show  I advised that it is a definitive test for asthma He verbalized understanding and states nothing further needed

## 2013-08-03 ENCOUNTER — Encounter: Payer: Medicare Other | Attending: Internal Medicine

## 2013-08-03 DIAGNOSIS — E119 Type 2 diabetes mellitus without complications: Secondary | ICD-10-CM

## 2013-08-03 DIAGNOSIS — Z713 Dietary counseling and surveillance: Secondary | ICD-10-CM | POA: Insufficient documentation

## 2013-08-04 ENCOUNTER — Ambulatory Visit (HOSPITAL_COMMUNITY)
Admission: RE | Admit: 2013-08-04 | Discharge: 2013-08-04 | Disposition: A | Discharge status: Home / Self Care | Payer: Medicare Other | Source: Ambulatory Visit | Attending: Internal Medicine | Admitting: Internal Medicine

## 2013-08-04 DIAGNOSIS — J45909 Unspecified asthma, uncomplicated: Secondary | ICD-10-CM | POA: Insufficient documentation

## 2013-08-04 LAB — PULMONARY FUNCTION TEST

## 2013-08-04 MED ORDER — ALBUTEROL SULFATE (5 MG/ML) 0.5% IN NEBU
2.5000 mg | INHALATION_SOLUTION | Freq: Once | RESPIRATORY_TRACT | Status: AC
  Administered 2013-08-04: 2.5 mg via RESPIRATORY_TRACT

## 2013-08-04 MED ORDER — METHACHOLINE 0.25 MG/ML NEB SOLN
2.0000 mL | Freq: Once | RESPIRATORY_TRACT | Status: AC
  Administered 2013-08-04: 0.5 mg via RESPIRATORY_TRACT

## 2013-08-04 MED ORDER — METHACHOLINE 16 MG/ML NEB SOLN
2.0000 mL | Freq: Once | RESPIRATORY_TRACT | Status: AC
  Administered 2013-08-04: 32 mg via RESPIRATORY_TRACT

## 2013-08-04 MED ORDER — SODIUM CHLORIDE 0.9 % IN NEBU
3.0000 mL | INHALATION_SOLUTION | Freq: Once | RESPIRATORY_TRACT | Status: AC
  Administered 2013-08-04: 3 mL via RESPIRATORY_TRACT

## 2013-08-04 MED ORDER — METHACHOLINE 0.0625 MG/ML NEB SOLN
2.0000 mL | Freq: Once | RESPIRATORY_TRACT | Status: AC
  Administered 2013-08-04: 0.125 mg via RESPIRATORY_TRACT

## 2013-08-04 MED ORDER — METHACHOLINE 4 MG/ML NEB SOLN
2.0000 mL | Freq: Once | RESPIRATORY_TRACT | Status: AC
  Administered 2013-08-04: 8 mg via RESPIRATORY_TRACT

## 2013-08-04 MED ORDER — METHACHOLINE 1 MG/ML NEB SOLN
2.0000 mL | Freq: Once | RESPIRATORY_TRACT | Status: AC
  Administered 2013-08-04: 2 mg via RESPIRATORY_TRACT

## 2013-08-09 ENCOUNTER — Ambulatory Visit: Payer: Medicare Other

## 2013-08-11 NOTE — Progress Notes (Signed)
  Patient was seen on 08/03/13 for the third of a series of three diabetes self-management courses at the [facility]. The following learning objectives were met by the patient during this course:    Describe how diabetes changes over time   Identify diabetes complications and ways to prevent them   Describe strategies that can promote heart health including lowering blood pressure and cholesterol   Describe strategies to lower dietary fat and sodium in the diet   Identify physical activities that benefit cardiovascular health   Evaluate success in meeting personal goal   Describe the belief that they can live successfully with diabetes day to day   Establish 2-3 goals that they will plan to diligently work on until they return for the free 33-month follow-up visit  The following handouts were given in class:  4  Month Follow Up Visit handout  Goal setting handout  Class evaluation form  Your patient has established the following 4 month goals for diabetes self-care:  None  Follow-Up Plan: Patient will attend a 4 month follow-up visit for diabetes self-management education.

## 2013-08-24 ENCOUNTER — Ambulatory Visit: Payer: Medicare Other

## 2013-09-05 ENCOUNTER — Telehealth: Payer: Self-pay | Admitting: Internal Medicine

## 2013-09-05 DIAGNOSIS — J45909 Unspecified asthma, uncomplicated: Secondary | ICD-10-CM

## 2013-09-05 NOTE — Telephone Encounter (Signed)
Pt aware. He reports he will call Monday morning. Nothing further needed

## 2013-09-05 NOTE — Telephone Encounter (Signed)
Results in MW look-at.Carron Curie, CMA

## 2013-09-05 NOTE — Telephone Encounter (Signed)
Not showing as final- have pft lab fax results over if possible

## 2013-09-05 NOTE — Telephone Encounter (Signed)
Pt had MCC on 08/04/13. He has not heard ay results. Please advise MW thanks

## 2013-09-05 NOTE — Telephone Encounter (Signed)
Neg for asthma, needs ov with all meds in hand to regroup for longterm purposes and to review the study in more detail

## 2013-09-07 ENCOUNTER — Ambulatory Visit: Payer: Medicare Other | Admitting: Cardiovascular Disease

## 2013-09-08 ENCOUNTER — Ambulatory Visit: Payer: Medicare Other | Admitting: Cardiovascular Disease

## 2013-09-18 ENCOUNTER — Ambulatory Visit (INDEPENDENT_AMBULATORY_CARE_PROVIDER_SITE_OTHER): Payer: Medicare Other | Admitting: Cardiovascular Disease

## 2013-09-18 ENCOUNTER — Encounter: Payer: Self-pay | Admitting: Cardiovascular Disease

## 2013-09-18 VITALS — BP 118/70 | HR 79 | Ht 70.5 in | Wt 208.0 lb

## 2013-09-18 DIAGNOSIS — I10 Essential (primary) hypertension: Secondary | ICD-10-CM

## 2013-09-18 DIAGNOSIS — E785 Hyperlipidemia, unspecified: Secondary | ICD-10-CM

## 2013-09-18 DIAGNOSIS — I2510 Atherosclerotic heart disease of native coronary artery without angina pectoris: Secondary | ICD-10-CM

## 2013-09-18 DIAGNOSIS — I4510 Unspecified right bundle-branch block: Secondary | ICD-10-CM

## 2013-09-18 NOTE — Assessment & Plan Note (Signed)
Stable,  Will ckeck lipids next 6 months.

## 2013-09-18 NOTE — Patient Instructions (Signed)
Your physician recommends that you return for lab work in: today, bmet lipid liver  Your physician wants you to follow-up in: 6 months You will receive a reminder letter in the mail two months in advance. If you don't receive a letter, please call our office to schedule the follow-up appointment.  Your physician recommends that you continue on your current medications as directed. Please refer to the Current Medication list given to you today.

## 2013-09-18 NOTE — Progress Notes (Signed)
[name] [name] Date of Birth  1935-08-07       [facility]    [place] [address]  [address] [place], [place]  [address]   [place], [place]  [address] [phone]     [phone]   Fax  [phone]    Fax [phone]  Problem List: 1. Hypertension 2. Orthostatic hypotension 3. Hypercholesterolemia 4. Prostate cancer 5. Dyslipidemia 6. Diabetes Mellitus 7. RBBB  History of Present Illness: [name] [name] is a 78 year old gentleman with a history of hypertension. Also has intermittent episodes of orthostatic hypotension. He also has a history of hypercholesterolemia and history of prostate cancer.  [name] [name] has had problems with fatigue.  He is limited by his arthritis and back pain.  He denies any chest pain or dyspnea.  He wakes up gasping for air frequently.  His symptoms sound like sleep apnea. He has lots of postnasal drip and also has asthma. He did not take his blood pressure medicines today because he's fasting. This may explain his mild blood pressure elevation.  Mar 17, 2013:  [name] [name] is doing OK from a cardiac standpoint.  He is having some asthma problems.  No CP.   He quit smoking his pipe.  His BP is typically well controlled - it is a bit high today b/c he has not taken his meds yet this am.  Dec. 1, 2014:  [name] [name] was diagnosed with diabetes mellitus recently.  He has been recording his BP.    Current Outpatient Prescriptions on File Prior to Visit  Medication Sig Dispense Refill  . albuterol (PROAIR HFA) 108 (90 BASE) MCG/ACT inhaler Inhale 2 puffs into the lungs every 6 (six) hours as needed for wheezing.  3 Inhaler  3  . cetirizine (ZYRTEC) 10 MG tablet Take 10 mg by mouth daily.      . finasteride (PROSCAR) 5 MG tablet Take 5 mg by mouth daily.        . fluticasone (FLONASE) 50 MCG/ACT nasal spray Place 2 sprays into the nose daily.       Marland Kitchen ibuprofen (ADVIL,MOTRIN) 200 MG tablet Take 600 mg by mouth every 6 (six) hours as  needed for pain.      Marland Kitchen losartan (COZAAR) 50 MG tablet Take 1 tablet (50 mg total) by mouth daily.  90 tablet  3  . nitroGLYCERIN (NITROSTAT) 0.4 MG SL tablet Place 1 tablet (0.4 mg total) under the tongue every 5 (five) minutes as needed (up to three doses, if pain continues call 911).  25 tablet  11  . pantoprazole (PROTONIX) 40 MG tablet Take 1 tablet (40 mg total) by mouth daily.  30 tablet  11  . potassium chloride SA (K-DUR,KLOR-CON) 20 MEQ tablet TAKE 1 TABLET BY MOUTH TWICE DAILY  60 tablet  1  . rosuvastatin (CRESTOR) 5 MG tablet Take 1 tablet (5 mg total) by mouth daily.  90 tablet  3  . triamterene-hydrochlorothiazide (DYAZIDE) 37.5-25 MG per capsule TAKE 1 CAPSULE BY MOUTH EVERY MORNING.  90 capsule  2   No current facility-administered medications on file prior to visit.    Allergies  Allergen Reactions  . Fluticasone-Salmeterol     REACTION: sensation of throat closing  . Lisinopril Cough  . Penicillins Hives  . Prednisone     Per LO:VFIEPPI agitation  . Shark Cartilage   . Tetracyclines & Related Hives    Past Medical History  Diagnosis Date  . Hypertension   .  Hyperlipidemia   . History of orthostatic hypotension   . Prostate cancer   . BPH (benign prostatic hypertrophy)   . MI (myocardial infarction)   . Asthma   . Rheumatic fever   . Diabetes   . Sleep apnea     Past Surgical History  Procedure Laterality Date  . Appendectomy  1954  . Hernia repair    . Vasectomy      History  Smoking status  . Former Smoker -- 1.00 packs/day for 30 years  . Types: Cigarettes, Pipe  . Quit date: 10/19/2012  Smokeless tobacco  . Never Used    Comment: 1 pipe every 3 days, quit smoking cig 12 yrs ago 07/06/13    History  Alcohol Use No    Comment: no etoh in 12 years    Family History  Problem Relation Age of Onset  . Heart failure Mother   . Arthritis Other   . Diabetes Other   . Heart disease Other   . Hyperlipidemia Other   . Kidney disease Other    . Obesity Other     Reviw of Systems:  Reviewed in the HPI.  All other systems are negative.  Physical Exam: Blood pressure 118/70, pulse 79, height 5' 10.5" (1.791 m), weight 208 lb (94.348 kg). General: Well developed, well nourished, in no acute distress.  Head: Normocephalic, atraumatic, sclera non-icteric, mucus membranes are moist,   Neck: Supple. Carotids are 2 + without bruits. No JVD  Lungs: Clear bilaterally to auscultation.  Heart: regular rate.  normal  S1 S2. No murmurs, gallops or rubs.  Abdomen: Soft, non-tender, non-distended with normal bowel sounds. No hepatomegaly. No rebound/guarding. No masses.  Msk:  Strength and tone are normal  Extremities: No clubbing or cyanosis. No edema.  Distal pedal pulses are 2+ and equal bilaterally.  Neuro: Alert and oriented X 3. Moves all extremities spontaneously.  Psych:  Responds to questions appropriately with a normal affect.  ECG: Dec. 1, 2014:  NSR at 79,  1st degree AVB.  RBBB.   Assessment / Plan:

## 2013-09-18 NOTE — Assessment & Plan Note (Signed)
[name] [name] is doing well.  [name] [name] has lost weight on his low carb diet.   [name] [name] is just getting used to his diabetic diet and had lots of questions about his glucose levels.

## 2013-11-30 ENCOUNTER — Other Ambulatory Visit: Payer: Self-pay | Admitting: *Deleted

## 2013-11-30 MED ORDER — LOSARTAN POTASSIUM 50 MG PO TABS: 50.0000 mg | ORAL_TABLET | Freq: Every day | ORAL | Status: DC

## 2013-12-05 ENCOUNTER — Ambulatory Visit: Payer: Medicare Other | Admitting: *Deleted

## 2014-01-01 ENCOUNTER — Other Ambulatory Visit: Payer: Self-pay | Admitting: Cardiovascular Disease

## 2014-03-05 ENCOUNTER — Other Ambulatory Visit: Payer: Self-pay | Admitting: Cardiovascular Disease

## 2014-03-26 ENCOUNTER — Ambulatory Visit (INDEPENDENT_AMBULATORY_CARE_PROVIDER_SITE_OTHER): Payer: Medicare Other | Admitting: *Deleted

## 2014-03-26 DIAGNOSIS — I10 Essential (primary) hypertension: Secondary | ICD-10-CM

## 2014-03-26 DIAGNOSIS — E785 Hyperlipidemia, unspecified: Secondary | ICD-10-CM

## 2014-03-26 LAB — BASIC METABOLIC PANEL
BUN: 14 mg/dL (ref 6–23)
CO2: 30 mEq/L (ref 19–32)
Calcium: 8.9 mg/dL (ref 8.4–10.5)
Chloride: 102 mEq/L (ref 96–112)
Creatinine, Ser: 0.8 mg/dL (ref 0.4–1.5)
GFR: 107.22 mL/min (ref >60.00)
Glucose, Bld: 159 mg/dL — ABNORMAL HIGH (ref 70–99)
Potassium: 4.5 mEq/L (ref 3.5–5.1)
Sodium: 138 mEq/L (ref 135–145)

## 2014-03-26 LAB — HEPATIC FUNCTION PANEL
ALT: 17 U/L (ref 0–53)
AST: 17 U/L (ref 0–37)
Albumin: 3.7 g/dL (ref 3.5–5.2)
Alkaline Phosphatase: 62 U/L (ref 39–117)
Bilirubin, Direct: 0.1 mg/dL (ref 0.0–0.3)
Total Bilirubin: 0.5 mg/dL (ref 0.2–1.2)
Total Protein: 6.3 g/dL (ref 6.0–8.3)

## 2014-03-26 LAB — LIPID PANEL
Cholesterol: 131 mg/dL (ref 0–200)
HDL: 33.6 mg/dL — ABNORMAL LOW (ref >39.00)
LDL Cholesterol: 79 mg/dL (ref 0–99)
NonHDL: 97.4
Total CHOL/HDL Ratio: 4
Triglycerides: 92 mg/dL (ref 0.0–149.0)
VLDL: 18.4 mg/dL (ref 0.0–40.0)

## 2014-03-28 ENCOUNTER — Encounter (INDEPENDENT_AMBULATORY_CARE_PROVIDER_SITE_OTHER): Payer: Self-pay

## 2014-03-28 ENCOUNTER — Ambulatory Visit (INDEPENDENT_AMBULATORY_CARE_PROVIDER_SITE_OTHER): Payer: Medicare Other | Admitting: Cardiovascular Disease

## 2014-03-28 ENCOUNTER — Encounter: Payer: Self-pay | Admitting: Cardiovascular Disease

## 2014-03-28 VITALS — BP 156/92 | HR 97 | Wt 216.0 lb

## 2014-03-28 DIAGNOSIS — I10 Essential (primary) hypertension: Secondary | ICD-10-CM

## 2014-03-28 MED ORDER — POTASSIUM CHLORIDE CRYS ER 20 MEQ PO TBCR: EXTENDED_RELEASE_TABLET | ORAL | Status: DC

## 2014-03-28 NOTE — Assessment & Plan Note (Signed)
[name] [name] is doing well.  His BP is a bit elevated today.  Overall, he seems to be doing quite well. I'll see him again in 6 months. We'll check fasting labs at that time. I've encouraged him to continue with the same medications and same exercise regimen

## 2014-03-28 NOTE — Progress Notes (Signed)
[name] [name] Date of Birth  [birth year]       [facility]    [place] [address]  [address] [place], [place]  [address]   [place], [place]  [address] [phone]     [phone]   Fax  [phone]    Fax [phone]  Problem List: 1. Hypertension 2. Orthostatic hypotension 3. Hypercholesterolemia 4. Prostate cancer 5. Dyslipidemia 6. Diabetes Mellitus 7. RBBB  History of Present Illness: [name] [name] is a 79 year old gentleman with a history of hypertension. Also has intermittent episodes of orthostatic hypotension. He also has a history of hypercholesterolemia and history of prostate cancer.  [name] [name] has had problems with fatigue.  He is limited by his arthritis and back pain.  He denies any chest pain or dyspnea.  He wakes up gasping for air frequently.  His symptoms sound like sleep apnea. He has lots of postnasal drip and also has asthma. He did not take his blood pressure medicines today because he's fasting. This may explain his mild blood pressure elevation.  Mar 17, 2013:  [name] [name] is doing OK from a cardiac standpoint.  He is having some asthma problems.  No CP.   He quit smoking his pipe.  His BP is typically well controlled - it is a bit high today b/c he has not taken his meds yet this am.  Dec. 1, 2014:  [name] [name] was diagnosed with diabetes mellitus recently.  He has been recording his BP.    March 28, 2014: [name] [name] is doing ok.  He was on vacation last week and ate lots of foods with salt.  That may explain his HTN today.  No CP  Current Outpatient Prescriptions on File Prior to Visit  Medication Sig Dispense Refill  . albuterol (PROAIR HFA) 108 (90 BASE) MCG/ACT inhaler Inhale 2 puffs into the lungs every 6 (six) hours as needed for wheezing.  3 Inhaler  3  . Blood Glucose Monitoring Suppl (TRUERESULT BLOOD GLUCOSE) W/DEVICE KIT       . cetirizine (ZYRTEC) 10 MG tablet Take 10 mg by mouth daily.      . finasteride (PROSCAR) 5 MG  tablet Take 5 mg by mouth daily.        . fluticasone (FLONASE) 50 MCG/ACT nasal spray Place 2 sprays into the nose daily.       Marland Kitchen ibuprofen (ADVIL,MOTRIN) 200 MG tablet Take 600 mg by mouth every 6 (six) hours as needed for pain.      Marland Kitchen losartan (COZAAR) 50 MG tablet Take 1 tablet (50 mg total) by mouth daily.  90 tablet  1  . nitroGLYCERIN (NITROSTAT) 0.4 MG SL tablet Place 1 tablet (0.4 mg total) under the tongue every 5 (five) minutes as needed (up to three doses, if pain continues call 911).  25 tablet  11  . pantoprazole (PROTONIX) 40 MG tablet Take 1 tablet (40 mg total) by mouth daily.  30 tablet  11  . potassium chloride SA (K-DUR,KLOR-CON) 20 MEQ tablet TAKE 1 TABLET BY MOUTH TWICE DAILY  60 tablet  3  . rosuvastatin (CRESTOR) 5 MG tablet Take 1 tablet (5 mg total) by mouth daily.  90 tablet  3  . triamterene-hydrochlorothiazide (DYAZIDE) 37.5-25 MG per capsule TAKE 1 CAPSULE BY MOUTH EVERY MORNING.  90 capsule  2  . TRUEPLUS LANCETS 28G MISC       . TRUETEST TEST test strip        No current  facility-administered medications on file prior to visit.    Allergies  Allergen Reactions  . Fluticasone-Salmeterol     REACTION: sensation of throat closing  . Lisinopril Cough  . Penicillins Hives  . Prednisone     Per PO:IPPGFQM agitation  . Shark Cartilage   . Tetracyclines & Related Hives    Past Medical History  Diagnosis Date  . Hypertension   . Hyperlipidemia   . History of orthostatic hypotension   . Prostate cancer   . BPH (benign prostatic hypertrophy)   . MI (myocardial infarction)   . Asthma   . Rheumatic fever   . Diabetes   . Sleep apnea     Past Surgical History  Procedure Laterality Date  . Appendectomy  1954  . Hernia repair    . Vasectomy      History  Smoking status  . Former Smoker -- 1.00 packs/day for 30 years  . Types: Cigarettes, Pipe  . Quit date: 10/19/2012  Smokeless tobacco  . Never Used    Comment: 1 pipe every 3 days, quit smoking  cig 12 yrs ago 07/06/13    History  Alcohol Use No    Comment: no etoh in 12 years    Family History  Problem Relation Age of Onset  . Heart failure Mother   . Arthritis Other   . Diabetes Other   . Heart disease Other   . Hyperlipidemia Other   . Kidney disease Other   . Obesity Other     Reviw of Systems:  Reviewed in the HPI.  All other systems are negative.  Physical Exam: Blood pressure 156/92, pulse 97, weight 216 lb (97.977 kg). General: Well developed, well nourished, in no acute distress.  Head: Normocephalic, atraumatic, sclera non-icteric, mucus membranes are moist,   Neck: Supple. Carotids are 2 + without bruits. No JVD  Lungs: Clear bilaterally to auscultation.  Heart: regular rate.  normal  S1 S2. No murmurs, gallops or rubs.  Abdomen: Soft, non-tender, non-distended with normal bowel sounds. No hepatomegaly. No rebound/guarding. No masses.  Msk:  Strength and tone are normal  Extremities: No clubbing or cyanosis. No edema.  Distal pedal pulses are 2+ and equal bilaterally.  Neuro: Alert and oriented X 3. Moves all extremities spontaneously.  Psych:  Responds to questions appropriately with a normal affect.  ECG: Dec. 1, 2014:  NSR at 79,  1st degree AVB.  RBBB.   Assessment / Plan:

## 2014-03-28 NOTE — Patient Instructions (Addendum)
Get some magnesium suppliments for  leg cramps  Your physician wants you to follow-up in: 6 months with Dr. Vilinda Boehringer will receive a reminder letter in the mail two months in advance. If you don't receive a letter, please call our office to schedule the follow-up appointment.   Your physician recommends that you return for a FASTING lipid profile: LIVER,BMET IN 6 MONTHS  Your physician recommends that you continue on your current medications as directed. Please refer to the Current Medication list given to you today.

## 2014-03-30 ENCOUNTER — Telehealth: Payer: Self-pay | Admitting: Emergency Medicine

## 2014-03-30 DIAGNOSIS — J45909 Unspecified asthma, uncomplicated: Secondary | ICD-10-CM

## 2014-03-30 MED ORDER — PANTOPRAZOLE SODIUM 40 MG PO TBEC: 40.0000 mg | DELAYED_RELEASE_TABLET | Freq: Every day | ORAL | Status: DC

## 2014-03-30 NOTE — Telephone Encounter (Signed)
I called spoke with pt. Aware RX has been sent. Nothing further needed

## 2014-04-03 ENCOUNTER — Telehealth: Payer: Self-pay | Admitting: Cardiovascular Disease

## 2014-04-03 NOTE — Telephone Encounter (Signed)
Lab results reviewed with patient who verbalized understanding and gratitude.

## 2014-04-03 NOTE — Telephone Encounter (Signed)
New message     Returning [name] [name]'s call

## 2014-04-26 ENCOUNTER — Encounter: Payer: Self-pay | Admitting: Emergency Medicine

## 2014-04-26 ENCOUNTER — Ambulatory Visit (INDEPENDENT_AMBULATORY_CARE_PROVIDER_SITE_OTHER): Payer: Medicare Other | Admitting: Emergency Medicine

## 2014-04-26 VITALS — BP 124/82 | HR 90 | Ht 70.0 in | Wt 216.0 lb

## 2014-04-26 DIAGNOSIS — J309 Allergic rhinitis, unspecified: Secondary | ICD-10-CM

## 2014-04-26 DIAGNOSIS — R05 Cough: Secondary | ICD-10-CM

## 2014-04-26 DIAGNOSIS — R059 Cough, unspecified: Secondary | ICD-10-CM

## 2014-04-26 NOTE — Progress Notes (Signed)
Subjective:    Patient ID: [name] [name], male    DOB: 1935-04-11, 79 y.o.   MRN: [id number]  HPI 79 yo man, former smoker (30 pk-yrs), HTN, CAD/MI, DM, OSA, dx with COPD/asthma in 2004 by Dr Isidoro Donning. At that time he was having trouble with allergies that were exacerbating breathing symptoms. In 6/14 he had an acute episode of dyspnea while sitting, had to call EMS, thought he improved with albuterol nebs. CXR and spirometry in June '14 look normal, although there may have been some curve to the flow-volume loop. He reports that his allergic sx have been bad since moving to [place] in the 80's. For the last yr, has been harder to manage > has episodes of jerking himself awake sleeping, snores loudly per wife, witnessed apneas. He has exertional SOB.    ROV 04/26/14 -- follows up for cough and suspect COPD / asthma. Also w allergies and GERD. PFFT 08/04/13 > mixed disease, normal response to methacholine, FEV1 2.57L (85% pred). He is on loratadine, fluticasone, nasal saline spray and changed his PPI to evening time > helped his waking up with SOB. He has been stable > some cough and drainage. Breathing well, rarely uses albuterol.    Review of Systems  Constitutional: Negative for fever and unexpected weight change.  HENT: Positive for postnasal drip and sore throat. Negative for congestion, dental problem, ear pain, nosebleeds, rhinorrhea, sinus pressure, sneezing and trouble swallowing.   Eyes: Negative for redness and itching.  Respiratory: Positive for shortness of breath. Negative for cough, chest tightness and wheezing.   Cardiovascular: Negative for palpitations and leg swelling.  Gastrointestinal: Negative for nausea and vomiting.  Genitourinary: Negative for dysuria.  Musculoskeletal: Negative for joint swelling.  Skin: Negative for rash.  Neurological: Positive for dizziness. Negative for headaches.  Hematological: Does not bruise/bleed easily.  Psychiatric/Behavioral: Positive for sleep  disturbance. Negative for dysphoric mood. The patient is not nervous/anxious.     Past Medical History  Diagnosis Date  . Hypertension   . Hyperlipidemia   . History of orthostatic hypotension   . Prostate cancer   . BPH (benign prostatic hypertrophy)   . MI (myocardial infarction)   . Asthma   . Rheumatic fever   . Diabetes   . Sleep apnea      Family History  Problem Relation Age of Onset  . Heart failure Mother   . Arthritis Other   . Diabetes Other   . Heart disease Other   . Hyperlipidemia Other   . Kidney disease Other   . Obesity Other      History   Social History  . Marital Status: Married    Spouse Name: N/A    Number of Children: 2  . Years of Education: N/A   Occupational History  . retired     Chartered certified accountant for [employer] Topics  . Smoking status: Former Smoker -- 1.00 packs/day for 30 years    Types: Cigarettes, Pipe    Quit date: 10/19/2012  . Smokeless tobacco: Never Used     Comment: 1 pipe every 3 days, quit smoking cig 12 yrs ago 07/06/13  . Alcohol Use: No     Comment: no etoh in 12 years  . Drug Use: No  . Sexual Activity: Not on file   Other Topics Concern  . Not on file   Social History Narrative  . No narrative on file  Has lived all over the [place] Was in  the [employer], no overseas duty.   Allergies  Allergen Reactions  . Fluticasone-Salmeterol     REACTION: sensation of throat closing  . Lisinopril Cough  . Penicillins Hives  . Prednisone     Per RX:YVOPFYT agitation  . Shark Cartilage   . Tetracyclines & Related Hives     Outpatient Prescriptions Prior to Visit  Medication Sig Dispense Refill  . albuterol (PROAIR HFA) 108 (90 BASE) MCG/ACT inhaler Inhale 2 puffs into the lungs every 6 (six) hours as needed for wheezing.  3 Inhaler  3  . Blood Glucose Monitoring Suppl (TRUERESULT BLOOD GLUCOSE) W/DEVICE KIT       . finasteride (PROSCAR) 5 MG tablet Take 5 mg by mouth daily.        . fluticasone (FLONASE)  50 MCG/ACT nasal spray Place 2 sprays into the nose daily.       Marland Kitchen ibuprofen (ADVIL,MOTRIN) 200 MG tablet Take 600 mg by mouth every 6 (six) hours as needed for pain.      Marland Kitchen losartan (COZAAR) 50 MG tablet Take 1 tablet (50 mg total) by mouth daily.  90 tablet  1  . nitroGLYCERIN (NITROSTAT) 0.4 MG SL tablet Place 1 tablet (0.4 mg total) under the tongue every 5 (five) minutes as needed (up to three doses, if pain continues call 911).  25 tablet  11  . pantoprazole (PROTONIX) 40 MG tablet Take 1 tablet (40 mg total) by mouth daily.  90 tablet  0  . potassium chloride SA (K-DUR,KLOR-CON) 20 MEQ tablet TAKE 1 TABLET BY MOUTH TWICE DAILY  180 tablet  3  . rosuvastatin (CRESTOR) 5 MG tablet Take 1 tablet (5 mg total) by mouth daily.  90 tablet  3  . triamterene-hydrochlorothiazide (DYAZIDE) 37.5-25 MG per capsule TAKE 1 CAPSULE BY MOUTH EVERY MORNING.  90 capsule  2  . TRUEPLUS LANCETS 28G MISC       . TRUETEST TEST test strip       . cetirizine (ZYRTEC) 10 MG tablet Take 10 mg by mouth daily.       No facility-administered medications prior to visit.         Objective:   Physical Exam Filed Vitals:   04/26/14 1056  BP: 124/82  Pulse: 90  Height: '5\' 10"'  (1.778 m)  Weight: 216 lb (97.977 kg)  SpO2: 97%   Gen: Pleasant, well-nourished, in no distress,  normal affect  ENT: No lesions,  mouth clear,  oropharynx clear, no postnasal drip  Neck: No JVD, no TMG, no carotid bruits  Lungs: No use of accessory muscles, no dullness to percussion, clear without rales or rhonchi  Cardiovascular: RRR, heart sounds normal, no murmur or gallops, no peripheral edema  Abdomen: soft and NT, no HSM,  BS normal  Musculoskeletal: No deformities, no cyanosis or clubbing  Neuro: alert, non focal  Skin: Warm, no lesions or rashes       03/28/13 --  Comparison: 01/02/2009  Findings: The lungs are clear. There are scattered areas of  parenchymal scarring and atelectasis which are relatively  stable.  No edema, focal infiltrate, nodule or pleural fluid is identified.  Heart size and mediastinal contours are within normal limits. The  bony thorax is unremarkable.  IMPRESSION:  No active disease.     Assessment & Plan:  COPD (chronic obstructive pulmonary disease) Please continue your pantoprazole 82m in the evening Continue your your loratadine daily You could consider increasing your fluticasone nasal spray to twice a day during the  allergy months.  Use your albuterol 2 puffs as needed.  Follow with Dr Lamonte Sakai in 12 months or sooner if you have any problems  ALLERGIC RHINITIS - could consider increasing flonase during allergy months - discussed this w him  COUGH Stable on GERD and allergy therapy

## 2014-04-26 NOTE — Patient Instructions (Signed)
Please continue your pantoprazole 40mg  in the evening Continue your your loratadine daily You could consider increasing your fluticasone nasal spray to twice a day during the allergy months.  Use your albuterol 2 puffs as needed.  Follow with Dr Lamonte Sakai in 12 months or sooner if you have any problems

## 2014-04-26 NOTE — Assessment & Plan Note (Signed)
Stable on GERD and allergy therapy

## 2014-04-26 NOTE — Assessment & Plan Note (Signed)
Please continue your pantoprazole 40mg  in the evening Continue your your loratadine daily You could consider increasing your fluticasone nasal spray to twice a day during the allergy months.  Use your albuterol 2 puffs as needed.  Follow with Dr Lamonte Sakai in 12 months or sooner if you have any problems

## 2014-04-26 NOTE — Assessment & Plan Note (Signed)
-   could consider increasing flonase during allergy months - discussed this w him

## 2014-04-30 ENCOUNTER — Other Ambulatory Visit: Payer: Self-pay | Admitting: Cardiovascular Disease

## 2014-05-08 ENCOUNTER — Telehealth: Payer: Self-pay | Admitting: Cardiovascular Disease

## 2014-05-08 NOTE — Telephone Encounter (Signed)
New Message  Pt wife called reports the pt's hip 'bothers" him bad .Marland Kitchen She states that Dr. Brien Few [facility] requests to have a hip injection of cortisone. She reports the Pt is a diabetic and is not sure if it would be safe for him to have the injection. Please call back to discuss.

## 2014-05-08 NOTE — Telephone Encounter (Signed)
I spoke with pt wife. Pt is scheduled for cortisone injection on 05/23/14 with Dr. Orpah Melter.  Pt has been having a lot of hip pain & unable to walk much  She is aware that his blood sugar would become elevated after the injection. Pt does not check his blood sugar at home. Does not follow low carb diet. They did call pcp first but he was on vacation. Reassurance given.  Will forward to Dr. Acie Fredrickson that pt is having injection 05/23/14  Horton Chin RN

## 2014-06-04 ENCOUNTER — Other Ambulatory Visit: Payer: Self-pay | Admitting: Cardiovascular Disease

## 2014-06-26 ENCOUNTER — Other Ambulatory Visit: Payer: Self-pay | Admitting: Emergency Medicine

## 2014-06-28 NOTE — Telephone Encounter (Signed)
Pt wife calling a/b prescript pharm was to send in for pantoprazole to the [facility] on [address] says she would like 90 tabs can be reached @ [phone].Hillery Hunter

## 2014-08-07 ENCOUNTER — Other Ambulatory Visit: Payer: Self-pay | Admitting: Internal Medicine

## 2014-08-07 ENCOUNTER — Ambulatory Visit
Admission: RE | Admit: 2014-08-07 | Discharge: 2014-08-07 | Disposition: A | Discharge status: Home / Self Care | Payer: Medicare Other | Source: Ambulatory Visit | Attending: Internal Medicine | Admitting: Internal Medicine

## 2014-08-07 DIAGNOSIS — M5441 Lumbago with sciatica, right side: Secondary | ICD-10-CM

## 2014-09-03 ENCOUNTER — Other Ambulatory Visit: Payer: Self-pay | Admitting: Cardiovascular Disease

## 2014-09-05 ENCOUNTER — Telehealth: Payer: Self-pay | Admitting: Cardiovascular Disease

## 2014-09-05 NOTE — Telephone Encounter (Signed)
Hold Cozaar for now. Continue to monitor

## 2014-09-05 NOTE — Telephone Encounter (Signed)
New Msg   Patients wife calling to state that patients BP has dropped to 97/54 and he is feeling dizzy with no chest pain.

## 2014-09-05 NOTE — Telephone Encounter (Signed)
Received call from patient's wife who states patient is feeling dizzy and BP is 97/54, HR 90 bpm Wife states patient is tired  Wife states patient has experienced problems with low BP in the past and was advised by Dr. Acie Fredrickson to drink V8 and water which helped him.  Wife states patient has already done that today and now BP is 103/63, HR 84 Wife states BP has been low to normal; states it was 110/70 at PCP visit last month States only new medication is Tramadol HCL 50 mg for back pain.  Wife reports patient is taking medications as directed by Dr. Acie Fredrickson.  I advised wife that I will send message to Dr. Acie Fredrickson for advice.  Wife verbalized understanding and agreement.

## 2014-09-05 NOTE — Telephone Encounter (Signed)
Spoke with patient's wife who states patient's BP is now 113/68, HR 84 bpm I advised her that per Dr. Acie Fredrickson patient should hold Cozaar and continue to monitor BP.  I advised wife to call back next week to let me know how the patient's BP is doing without the Cozaar. Patient verbalized understanding and agreement.

## 2014-09-20 ENCOUNTER — Other Ambulatory Visit (INDEPENDENT_AMBULATORY_CARE_PROVIDER_SITE_OTHER): Payer: Medicare Other | Admitting: *Deleted

## 2014-09-20 DIAGNOSIS — I10 Essential (primary) hypertension: Secondary | ICD-10-CM

## 2014-09-20 LAB — LIPID PANEL
Cholesterol: 135 mg/dL (ref 0–200)
HDL: 30.5 mg/dL — ABNORMAL LOW (ref >39.00)
LDL Cholesterol: 83 mg/dL (ref 0–99)
NonHDL: 104.5
Total CHOL/HDL Ratio: 4
Triglycerides: 106 mg/dL (ref 0.0–149.0)
VLDL: 21.2 mg/dL (ref 0.0–40.0)

## 2014-09-20 LAB — HEPATIC FUNCTION PANEL
ALT: 12 U/L (ref 0–53)
AST: 14 U/L (ref 0–37)
Albumin: 4 g/dL (ref 3.5–5.2)
Alkaline Phosphatase: 56 U/L (ref 39–117)
Bilirubin, Direct: 0 mg/dL (ref 0.0–0.3)
Total Bilirubin: 0.6 mg/dL (ref 0.2–1.2)
Total Protein: 6.9 g/dL (ref 6.0–8.3)

## 2014-09-20 LAB — BASIC METABOLIC PANEL
BUN: 15 mg/dL (ref 6–23)
CO2: 28 mEq/L (ref 19–32)
Calcium: 8.9 mg/dL (ref 8.4–10.5)
Chloride: 99 mEq/L (ref 96–112)
Creatinine, Ser: 1 mg/dL (ref 0.4–1.5)
GFR: 81.52 mL/min (ref >60.00)
Glucose, Bld: 127 mg/dL — ABNORMAL HIGH (ref 70–99)
Potassium: 4 mEq/L (ref 3.5–5.1)
Sodium: 137 mEq/L (ref 135–145)

## 2014-09-24 ENCOUNTER — Encounter: Payer: Self-pay | Admitting: Cardiovascular Disease

## 2014-09-24 ENCOUNTER — Ambulatory Visit (INDEPENDENT_AMBULATORY_CARE_PROVIDER_SITE_OTHER): Payer: Medicare Other | Admitting: Cardiovascular Disease

## 2014-09-24 ENCOUNTER — Other Ambulatory Visit: Payer: Self-pay | Admitting: Emergency Medicine

## 2014-09-24 VITALS — BP 128/86 | HR 96 | Ht 68.0 in | Wt 195.0 lb

## 2014-09-24 DIAGNOSIS — E785 Hyperlipidemia, unspecified: Secondary | ICD-10-CM

## 2014-09-24 DIAGNOSIS — I10 Essential (primary) hypertension: Secondary | ICD-10-CM

## 2014-09-24 MED ORDER — ATORVASTATIN CALCIUM 20 MG PO TABS: 20.0000 mg | ORAL_TABLET | Freq: Every day | ORAL | Status: DC

## 2014-09-24 NOTE — Progress Notes (Signed)
[name] Date of Birth  February 06, 1935       [facility]    [employer] [address], [address], [place], [place]  [address]   [place], [place]  [id number] [phone]     [phone]   Fax  [phone]    Fax [phone]  Problem List: 1. Hypertension 2. Orthostatic hypotension 3. Hypercholesterolemia 4. Prostate cancer 5. Dyslipidemia 6. Diabetes Mellitus 7. RBBB  History of Present Illness: [name] [name] is a 79 year old gentleman with a history of hypertension. Also has intermittent episodes of orthostatic hypotension. He also has a history of hypercholesterolemia and history of prostate cancer.  [name] [name] has had problems with fatigue.  He is limited by his arthritis and back pain.  He denies any chest pain or dyspnea.  He wakes up gasping for air frequently.  His symptoms sound like sleep apnea. He has lots of postnasal drip and also has asthma. He did not take his blood pressure medicines today because he's fasting. This may explain his mild blood pressure elevation.  Mar 17, 2013:  [name] [name] is doing OK from a cardiac standpoint.  He is having some asthma problems.  No CP.   He quit smoking his pipe.  His BP is typically well controlled - it is a bit high today b/c he has not taken his meds yet this am.  Dec. 1, 2014:  [name] [name] was diagnosed with diabetes mellitus recently.  He has been recording his BP.    March 28, 2014: [name] [name] is doing ok.  He was on vacation last week and ate lots of foods with salt.  That may explain his HTN today.  No CP  Dec. 7, 2015:  [name] [name] is a 79 yo who we follow for HTN, hyperlipidemia. He states that he is not doing well.  Having trouble with controlling his diabetes. Has lost 17 lbs - wants to lost 15 more. Eating low carb bread.  Has lots of back pain - has been going to Dr. Jeanie Cooks - pain doctor.  X-rays have found lots of arthritis in his back.    Current Outpatient Prescriptions on File Prior to Visit   Medication Sig Dispense Refill  . albuterol (PROAIR HFA) 108 (90 BASE) MCG/ACT inhaler Inhale 2 puffs into the lungs every 6 (six) hours as needed for wheezing. 3 Inhaler 3  . Blood Glucose Monitoring Suppl (TRUERESULT BLOOD GLUCOSE) W/DEVICE KIT     . cetirizine (ZYRTEC) 10 MG tablet Take 10 mg by mouth daily.    . CRESTOR 5 MG tablet TAKE 1 TABLET (5 MG TOTAL) BY MOUTH DAILY. 90 tablet 1  . finasteride (PROSCAR) 5 MG tablet Take 5 mg by mouth daily.      . fluticasone (FLONASE) 50 MCG/ACT nasal spray Place 2 sprays into the nose daily.     Marland Kitchen losartan (COZAAR) 50 MG tablet TAKE 1 TABLET BY MOUTH DAILY. 90 tablet 0  . magnesium oxide (MAG-OX) 400 MG tablet Take 400 mg by mouth daily.    . nitroGLYCERIN (NITROSTAT) 0.4 MG SL tablet Place 1 tablet (0.4 mg total) under the tongue every 5 (five) minutes as needed (up to three doses, if pain continues call 911). 25 tablet 11  . pantoprazole (PROTONIX) 40 MG tablet TAKE 1 TABLET BY MOUTH DAILY. 90 tablet 0  . potassium chloride SA (K-DUR,KLOR-CON) 20 MEQ tablet TAKE 1 TABLET BY MOUTH TWICE DAILY 180 tablet 3  . triamterene-hydrochlorothiazide (DYAZIDE) 37.5-25 MG per capsule  TAKE 1 CAPSULE BY MOUTH EVERY MORNING. 90 capsule 2  . TRUEPLUS LANCETS 28G MISC     . TRUETEST TEST test strip      No current facility-administered medications on file prior to visit.    Allergies  Allergen Reactions  . Fluticasone-Salmeterol     REACTION: sensation of throat closing  . Lisinopril Cough  . Penicillins Hives  . Prednisone     Per HT:VGVSYVG agitation  . Shark Cartilage   . Tetracyclines & Related Hives    Past Medical History  Diagnosis Date  . Hypertension   . Hyperlipidemia   . History of orthostatic hypotension   . Prostate cancer   . BPH (benign prostatic hypertrophy)   . MI (myocardial infarction)   . Asthma   . Rheumatic fever   . Diabetes   . Sleep apnea     Past Surgical History  Procedure Laterality Date  . Appendectomy  1954   . Hernia repair    . Vasectomy      History  Smoking status  . Former Smoker -- 1.00 packs/day for 30 years  . Types: Cigarettes, Pipe  . Quit date: 10/19/2012  Smokeless tobacco  . Never Used    Comment: 1 pipe every 3 days, quit smoking cig 12 yrs ago 07/06/13    History  Alcohol Use No    Comment: no etoh in 12 years    Family History  Problem Relation Age of Onset  . Heart failure Mother   . Arthritis Other   . Diabetes Other   . Heart disease Other   . Hyperlipidemia Other   . Kidney disease Other   . Obesity Other     Reviw of Systems:  Reviewed in the HPI.  All other systems are negative.  Physical Exam: Blood pressure 128/86, pulse 96, height '5\' 8"'  (1.727 m), weight 195 lb (88.451 kg). General: Well developed, well nourished, in no acute distress.  Head: Normocephalic, atraumatic, sclera non-icteric, mucus membranes are moist,   Neck: Supple. Carotids are 2 + without bruits. No JVD  Lungs: Clear bilaterally to auscultation.  Heart: regular rate.  normal  S1 S2. No murmurs, gallops or rubs.  Abdomen: Soft, non-tender, non-distended with normal bowel sounds. No hepatomegaly. No rebound/guarding. No masses.  Msk:  Strength and tone are normal  Extremities: No clubbing or cyanosis. No edema.  Distal pedal pulses are 2+ and equal bilaterally.  Neuro: Alert and oriented X 3. Moves all extremities spontaneously.  Psych:  Responds to questions appropriately with a normal affect.  ECG: Dec. 7, 2015:  NSR with PACs   Assessment / Plan:

## 2014-09-24 NOTE — Assessment & Plan Note (Signed)
His lipids are well controlled. He wants to change her Crestor to atorvastatin to help reduce his cough.   We'll start him on atorvastatin 20 mg a day. We'll check fasting labs in 3 months. I'll see him again in 6 months for followup visit.

## 2014-09-24 NOTE — Assessment & Plan Note (Signed)
BP is ok Continue current meds Was concerned that the HCTZ is contributing to his elevated glucose.   I told him that it could but that he needed it for his BP

## 2014-09-24 NOTE — Patient Instructions (Signed)
Your physician has recommended you make the following change in your medication:  STOP Crestor START Atorvastatin 20 mg once daily  Your physician recommends that you return for lab work in: 3 months for repeat cholesterol/liver  Your physician wants you to follow-up in: 6 months with Dr. Vilinda Boehringer will receive a reminder letter in the mail two months in advance. If you don't receive a letter, please call our office to schedule the follow-up appointment.

## 2014-12-03 ENCOUNTER — Other Ambulatory Visit: Payer: Self-pay | Admitting: Cardiovascular Disease

## 2014-12-24 ENCOUNTER — Other Ambulatory Visit: Payer: Medicare Other

## 2014-12-31 ENCOUNTER — Other Ambulatory Visit: Payer: Self-pay | Admitting: Emergency Medicine

## 2015-02-13 ENCOUNTER — Other Ambulatory Visit: Payer: Self-pay | Admitting: Adult Health

## 2015-04-01 ENCOUNTER — Other Ambulatory Visit: Payer: Self-pay | Admitting: Emergency Medicine

## 2015-04-05 ENCOUNTER — Other Ambulatory Visit (INDEPENDENT_AMBULATORY_CARE_PROVIDER_SITE_OTHER): Payer: Medicare Other | Admitting: *Deleted

## 2015-04-05 ENCOUNTER — Other Ambulatory Visit: Payer: Self-pay | Admitting: *Deleted

## 2015-04-05 DIAGNOSIS — E785 Hyperlipidemia, unspecified: Secondary | ICD-10-CM

## 2015-04-05 LAB — LIPID PANEL
Cholesterol: 145 mg/dL (ref 0–200)
HDL: 37.2 mg/dL — ABNORMAL LOW (ref >39.00)
LDL Cholesterol: 74 mg/dL (ref 0–99)
NonHDL: 107.8
Total CHOL/HDL Ratio: 4
Triglycerides: 171 mg/dL — ABNORMAL HIGH (ref 0.0–149.0)
VLDL: 34.2 mg/dL (ref 0.0–40.0)

## 2015-04-05 LAB — HEPATIC FUNCTION PANEL
ALT: 13 U/L (ref 0–53)
AST: 12 U/L (ref 0–37)
Albumin: 4.1 g/dL (ref 3.5–5.2)
Alkaline Phosphatase: 66 U/L (ref 39–117)
Bilirubin, Direct: 0.1 mg/dL (ref 0.0–0.3)
Total Bilirubin: 0.5 mg/dL (ref 0.2–1.2)
Total Protein: 6.8 g/dL (ref 6.0–8.3)

## 2015-04-05 NOTE — Addendum Note (Signed)
Addended by: Eulis Foster on: 04/05/2015 08:32 AM   Modules accepted: Orders

## 2015-04-05 NOTE — Addendum Note (Signed)
Addended by: Eulis Foster on: 04/05/2015 08:33 AM   Modules accepted: Orders

## 2015-04-09 ENCOUNTER — Encounter: Payer: Self-pay | Admitting: Cardiovascular Disease

## 2015-04-09 ENCOUNTER — Ambulatory Visit (INDEPENDENT_AMBULATORY_CARE_PROVIDER_SITE_OTHER): Payer: Medicare Other | Admitting: Cardiovascular Disease

## 2015-04-09 VITALS — BP 110/50 | HR 91 | Ht 70.0 in | Wt 210.2 lb

## 2015-04-09 DIAGNOSIS — I10 Essential (primary) hypertension: Secondary | ICD-10-CM | POA: Diagnosis not present

## 2015-04-09 DIAGNOSIS — E785 Hyperlipidemia, unspecified: Secondary | ICD-10-CM | POA: Diagnosis not present

## 2015-04-09 NOTE — Patient Instructions (Addendum)
Medication Instructions:  Your physician recommends that you continue on your current medications as directed. Please refer to the Current Medication list given to you today. Call Christen Bame, RN at [phone] to call and request to change back to Crestor  Labwork: Your physician recommends that you return for lab work in: 6 months on the day of or a few days before your office visit with Dr. Acie Fredrickson.  You will need to FAST for this appointment - nothing to eat or drink after midnight the night before except water.   Testing/Procedures: None Ordered  Follow-Up: Your physician wants you to follow-up in: 6 months with Dr. Acie Fredrickson.  You will receive a reminder letter in the mail two months in advance. If you don't receive a letter, please call our office to schedule the follow-up appointment.

## 2015-04-09 NOTE — Progress Notes (Signed)
[name] [name] Date of Birth  [birth year]       [facility]    [employer] [address], [address], [place], [place]  [address]   [place], [place]  [address] [phone]     [phone]   Fax  [phone]    Fax [phone]  Problem List: 1. Hypertension 2. Orthostatic hypotension 3. Hypercholesterolemia 4. Prostate cancer 5. Dyslipidemia 6. Diabetes Mellitus 7. RBBB  History of Present Illness: [name] [name] is a 80 year old gentleman with a history of hypertension. Also has intermittent episodes of orthostatic hypotension. He also has a history of hypercholesterolemia and history of prostate cancer.  [name] [name] has had problems with fatigue.  He is limited by his arthritis and back pain.  He denies any chest pain or dyspnea.  He wakes up gasping for air frequently.  His symptoms sound like sleep apnea. He has lots of postnasal drip and also has asthma. He did not take his blood pressure medicines today because he's fasting. This may explain his mild blood pressure elevation.  Mar 17, 2013:  [name] [name] is doing OK from a cardiac standpoint.  He is having some asthma problems.  No CP.   He quit smoking his pipe.  His BP is typically well controlled - it is a bit high today b/c he has not taken his meds yet this am.  Dec. 1, 2014:  [name] [name] was diagnosed with diabetes mellitus recently.  He has been recording his BP.    March 28, 2014: [name] [name] is doing ok.  He was on vacation last week and ate lots of foods with salt.  That may explain his HTN today.  No CP  Dec. 7, 2015:  [name] [name] is a 80 yo who we follow for HTN, hyperlipidemia. He states that he is not doing well.  Having trouble with controlling his diabetes. Has lost 17 lbs - wants to lost 15 more. Eating low carb bread.  Has lots of back pain - has been going to Dr. Jeanie Cooks - pain doctor.  X-rays have found lots of arthritis in his back.   April 09, 2015:  [name] [name] is doing ok. Not exercising much.  Trigs are  a bit elevated.   Otherwise, labs are ok Eats lots of bread. , pasta, potatoes.    Current Outpatient Prescriptions on File Prior to Visit  Medication Sig Dispense Refill  . acetaminophen (TYLENOL) 500 MG tablet Take 500 mg by mouth 2 (two) times daily as needed.    Marland Kitchen albuterol (PROAIR HFA) 108 (90 BASE) MCG/ACT inhaler Inhale 2 puffs into the lungs every 6 (six) hours as needed for wheezing. 3 Inhaler 3  . atorvastatin (LIPITOR) 20 MG tablet Take 1 tablet (20 mg total) by mouth daily. 90 tablet 3  . Blood Glucose Monitoring Suppl (TRUERESULT BLOOD GLUCOSE) W/DEVICE KIT     . cetirizine (ZYRTEC) 10 MG tablet Take 10 mg by mouth daily.    . finasteride (PROSCAR) 5 MG tablet Take 5 mg by mouth daily.      . fluticasone (FLONASE) 50 MCG/ACT nasal spray Place 2 sprays into the nose daily.     Marland Kitchen losartan (COZAAR) 50 MG tablet TAKE 1 TABLET BY MOUTH DAILY. 90 tablet 1  . magnesium oxide (MAG-OX) 400 MG tablet Take 400 mg by mouth daily.    . nitroGLYCERIN (NITROSTAT) 0.4 MG SL tablet Place 1 tablet (0.4 mg total) under the tongue every 5 (five) minutes as needed (up to  three doses, if pain continues call 911). 25 tablet 11  . pantoprazole (PROTONIX) 40 MG tablet TAKE 1 TABLET BY MOUTH DAILY. 90 tablet 0  . potassium chloride SA (K-DUR,KLOR-CON) 20 MEQ tablet TAKE 1 TABLET BY MOUTH TWICE DAILY 180 tablet 3  . traMADol (ULTRAM) 50 MG tablet Take 50 mg by mouth every 6 (six) hours as needed.     . triamterene-hydrochlorothiazide (DYAZIDE) 37.5-25 MG per capsule TAKE 1 CAPSULE BY MOUTH EVERY MORNING 90 capsule 2  . TRUEPLUS LANCETS 28G MISC     . TRUETEST TEST test strip      No current facility-administered medications on file prior to visit.    Allergies  Allergen Reactions  . Doxycycline Monohydrate Hives  . Fluticasone-Salmeterol     REACTION: sensation of throat closing  . Lisinopril Cough  . Penicillins Hives  . Prednisone     Per HB:ZJIRCVE agitation  . Shark Cartilage   .  Tetracyclines & Related Hives    Past Medical History  Diagnosis Date  . Hypertension   . Hyperlipidemia   . History of orthostatic hypotension   . Prostate cancer   . BPH (benign prostatic hypertrophy)   . MI (myocardial infarction)   . Asthma   . Rheumatic fever   . Diabetes   . Sleep apnea     Past Surgical History  Procedure Laterality Date  . Appendectomy  1954  . Hernia repair    . Vasectomy      History  Smoking status  . Former Smoker -- 1.00 packs/day for 30 years  . Types: Cigarettes, Pipe  . Quit date: 10/19/2012  Smokeless tobacco  . Never Used    Comment: 1 pipe every 3 days, quit smoking cig 12 yrs ago 07/06/13    History  Alcohol Use No    Comment: no etoh in 12 years    Family History  Problem Relation Age of Onset  . Heart failure Mother   . Arthritis Other   . Diabetes Other   . Heart disease Other   . Hyperlipidemia Other   . Kidney disease Other   . Obesity Other     Reviw of Systems:  Reviewed in the HPI.  All other systems are negative.  Physical Exam: Blood pressure 110/50, pulse 91, height '5\' 10"'  (1.778 m), weight 95.346 kg (210 lb 3.2 oz), SpO2 93 %. General: Well developed, well nourished, in no acute distress.  Head: Normocephalic, atraumatic, sclera non-icteric, mucus membranes are moist,   Neck: Supple. Carotids are 2 + without bruits. No JVD  Lungs: Clear bilaterally to auscultation.  Heart: regular rate.  normal  S1 S2. No murmurs, gallops or rubs.  Abdomen: Soft, non-tender, non-distended with normal bowel sounds. No hepatomegaly. No rebound/guarding. No masses.  Msk:  Strength and tone are normal  Extremities: No clubbing or cyanosis. No edema.  Distal pedal pulses are 2+ and equal bilaterally.  Neuro: Alert and oriented X 3. Moves all extremities spontaneously.  Psych:  Responds to questions appropriately with a normal affect.  ECG:  Assessment / Plan:   1. Hypertension -   his blood pressure remains  fairly well-controlled. Continue current medications. 2. Orthostatic hypotension 3. Hypercholesterolemia -  his triglyceride levels are moderately elevated. He was much better controlled on Crestor. He'll check the [place] of Crestor to see if it is now less expensive. He was putting up to $300 per month for the Crestor. He may call and request change back to Crestor 10  mg a day after he checks with his drugstore.  4. Prostate cancer 6. Diabetes Mellitus 7. RBBB   Kelden Lavallee, Wonda Cheng, MD  04/09/2015 3:55 PM    [place],  [place], [place]  [address] Pager [phone] Phone: [phone]; Fax: [phone]   [facility]  [address] [place], [place]  [address] [phone]    Fax [phone]

## 2015-04-10 ENCOUNTER — Telehealth: Payer: Self-pay | Admitting: *Deleted

## 2015-04-10 MED ORDER — ROSUVASTATIN CALCIUM 10 MG PO TABS: 10.0000 mg | ORAL_TABLET | Freq: Every day | ORAL | Status: DC

## 2015-04-10 NOTE — Telephone Encounter (Signed)
Patient called to speak with you about something. I asked if it was a refill as he had reached the refill department and he said no. I made him aware that I would send you a message as you were in clinic, but he stated that he would need a call back within the next thirty minutes please and thank you. I stated that I could not guarantee a call back within that time frame and he replied ok whatever and hung up.

## 2015-04-10 NOTE — Telephone Encounter (Signed)
Spoke with patient who states he called his pharmacy and was informed that generic Crestor is not available yet.  He would like to go ahead and have a 90 day supply Rx sent to [facility] as he believes it will be cheaper than it has been in the past.  I advised him that if he starts the medication to call me back in August to schedule a September lab appointment to check fasting lipid/liver/bmet.  Patient verbalized understanding and agreement.

## 2015-05-10 ENCOUNTER — Encounter: Payer: Self-pay | Admitting: Emergency Medicine

## 2015-05-10 ENCOUNTER — Ambulatory Visit (INDEPENDENT_AMBULATORY_CARE_PROVIDER_SITE_OTHER): Payer: Medicare Other | Admitting: Emergency Medicine

## 2015-05-10 VITALS — BP 138/82 | HR 91 | Ht 70.0 in | Wt 192.0 lb

## 2015-05-10 DIAGNOSIS — G4733 Obstructive sleep apnea (adult) (pediatric): Secondary | ICD-10-CM

## 2015-05-10 HISTORY — DX: Obstructive sleep apnea (adult) (pediatric): G47.33

## 2015-05-10 MED ORDER — FLUTICASONE FUROATE-VILANTEROL 100-25 MCG/INH IN AEPB: 2.0000 | INHALATION_SPRAY | Freq: Every day | RESPIRATORY_TRACT | Status: DC

## 2015-05-10 NOTE — Assessment & Plan Note (Signed)
Only moderately controlled. He uses short-acting beta agonist frequently. Mild obstructive disease without hypersensitivity to methacholine identified in 2014.  We will plan to restart Breo once a day, continue albuterol as needed. Consider nebulized therapy at some point in the future depending on how well he tolerates these meds. We will work to control exacerbating factors including sleep apnea

## 2015-05-10 NOTE — Patient Instructions (Signed)
Please restart Breo once a day  Keep your albuterol available to use as needed for shortness of breath We will perform a sleep study to evaluate for sleep apnea. I believe that your sleep apnea is probably influencing our ability to control your mild asthma.  Follow with Dr Lamonte Sakai in 3 months or sooner if you have any problems.

## 2015-05-10 NOTE — Progress Notes (Signed)
Subjective:    Patient ID: [name] [name], male    DOB: 02/07/1935, 80 y.o.   MRN: [id number]  HPI 80 yo man, former smoker (30 pk-yrs), HTN, CAD/MI, DM, OSA, dx with COPD/asthma in 2004 by Dr Isidoro Donning. At that time he was having trouble with allergies that were exacerbating breathing symptoms. In 6/14 he had an acute episode of dyspnea while sitting, had to call EMS, thought he improved with albuterol nebs. CXR and spirometry in June '14 look normal, although there may have been some curve to the flow-volume loop. He reports that his allergic sx have been bad since moving to [place] in the 80's. For the last yr, has been harder to manage > has episodes of jerking himself awake sleeping, snores loudly per wife, witnessed apneas. He has exertional SOB.   ROV 04/26/14 -- follows up for cough and suspect COPD / asthma. Also w allergies and GERD. PFFT 08/04/13 > mixed disease, normal response to methacholine, FEV1 2.57L (85% pred). He is on loratadine, fluticasone, nasal saline spray and changed his PPI to evening time > helped his waking up with SOB. He has been stable > some cough and drainage. Breathing well, rarely uses albuterol.  He is having periods of obstructive apneas, witnessed by his wife. Leads him to wheeze and dyspnea. He uses albuterol prn during the day. He feels that he has gotten the most benefit when his breathing is severely obstructed he benefits from nebulized albuterol. He has discussed his OSA with Dr Isidoro Donning but hasn't had a PSG scheduled. He has been on Breo before, felt that it benefited.    Review of Systems  Constitutional: Negative for fever and unexpected weight change.  HENT: Positive for postnasal drip and sore throat. Negative for congestion, dental problem, ear pain, nosebleeds, rhinorrhea, sinus pressure, sneezing and trouble swallowing.   Eyes: Negative for redness and itching.  Respiratory: Positive for shortness of breath. Negative for cough, chest tightness and wheezing.    Cardiovascular: Negative for palpitations and leg swelling.  Gastrointestinal: Negative for nausea and vomiting.  Genitourinary: Negative for dysuria.  Musculoskeletal: Negative for joint swelling.  Skin: Negative for rash.  Neurological: Positive for dizziness. Negative for headaches.  Hematological: Does not bruise/bleed easily.  Psychiatric/Behavioral: Positive for sleep disturbance. Negative for dysphoric mood. The patient is not nervous/anxious.         Objective:   Physical Exam Filed Vitals:   05/10/15 1502  BP: 138/82  Pulse: 91  Height: 5\' 10"  (1.778 m)  Weight: 192 lb (87.091 kg)  SpO2: 96%   Gen: slightly anxious elderly man, in no distress,  normal affect  ENT: No lesions,  mouth clear,  oropharynx clear, no postnasal drip  Neck: No JVD, no TMG, no carotid bruits  Lungs: No use of accessory muscles, no dullness to percussion, clear without rales or rhonchi  Cardiovascular: RRR, heart sounds normal, no murmur or gallops, no peripheral edema  Musculoskeletal: No deformities, no cyanosis or clubbing  Neuro: alert, non focal  Skin: Warm, no lesions or rashes       03/28/13 --  Comparison: 01/02/2009  Findings: The lungs are clear. There are scattered areas of  parenchymal scarring and atelectasis which are relatively stable.  No edema, focal infiltrate, nodule or pleural fluid is identified.  Heart size and mediastinal contours are within normal limits. The  bony thorax is unremarkable.  IMPRESSION:  No active disease.     Assessment & Plan:  COPD (chronic obstructive pulmonary disease)  Only moderately controlled. He uses short-acting beta agonist frequently. Mild obstructive disease without hypersensitivity to methacholine identified in 2014.  We will plan to restart Breo once a day, continue albuterol as needed. Consider nebulized therapy at some point in the future depending on how well he tolerates these meds. We will work to control exacerbating  factors including sleep apnea  OSA (obstructive sleep apnea) Suspected obstructive sleep apnea based on his description and his wife's observations. I suspect that this is causing some of his bronchospasm and respiratory distress often nocturnal. We will perform a split night sleep study to better characterize the severity, guide treatment

## 2015-05-10 NOTE — Assessment & Plan Note (Signed)
Suspected obstructive sleep apnea based on his description and his wife's observations. I suspect that this is causing some of his bronchospasm and respiratory distress often nocturnal. We will perform a split night sleep study to better characterize the severity, guide treatment

## 2015-05-13 ENCOUNTER — Telehealth: Payer: Self-pay | Admitting: Emergency Medicine

## 2015-05-13 NOTE — Telephone Encounter (Signed)
Patients wife would like to know if patient can have sleep study done sooner than 10/11.  Patients wife says that he woke up last night and could not catch his breath.  Patient wants to be put on a cancellation list.   Called sleep center to put patient on cancellation list, they had a cancellation for this week on Wed 7/27 at 8pm.  Patient scheduled to have sleep study at that time.  Patient notified of appointment and confirmed with sleep center.  Nothing further needed.

## 2015-05-15 ENCOUNTER — Telehealth: Payer: Self-pay | Admitting: Emergency Medicine

## 2015-05-15 ENCOUNTER — Ambulatory Visit (HOSPITAL_BASED_OUTPATIENT_CLINIC_OR_DEPARTMENT_OTHER): Payer: Medicare Other | Attending: Emergency Medicine | Admitting: Radiology

## 2015-05-15 DIAGNOSIS — I493 Ventricular premature depolarization: Secondary | ICD-10-CM | POA: Insufficient documentation

## 2015-05-15 DIAGNOSIS — R0683 Snoring: Secondary | ICD-10-CM | POA: Diagnosis not present

## 2015-05-15 DIAGNOSIS — G4733 Obstructive sleep apnea (adult) (pediatric): Secondary | ICD-10-CM

## 2015-05-15 DIAGNOSIS — G4731 Primary central sleep apnea: Secondary | ICD-10-CM | POA: Insufficient documentation

## 2015-05-15 NOTE — Telephone Encounter (Signed)
Difficult to know what is happening without seeing him > the breo is new, could be helping him or possibly causing him to have more cough, mucous etc. I would like for him to stay on it for now. It is ok with me to substitute albuterol nebs for his albuterol HFA if he would like. Order q4h prn SOB

## 2015-05-15 NOTE — Telephone Encounter (Signed)
Spoke with pt's wife.  Pt is having sleep study tonight.  Pt's wife reports pt having increased sob since ov with Dr Lamonte Sakai on 05/10/15.  Some wheezing, prod cough (white).  Denies chest tightness.  Using Breo daily and having to use Proair 2-3 times daily.  They are wanting to know if pt would benefit from a nebulizer.  Please advise.

## 2015-05-16 NOTE — Telephone Encounter (Signed)
atc pt X2, fast busy signal.  Wcb.  

## 2015-05-17 NOTE — Telephone Encounter (Signed)
Spoke with the pt and notified of recs per RB He verbalized understanding  He wants to hold off of neb and just use albuterol hfa for now until PSG results come back  Nothing further needed at this time

## 2015-05-28 DIAGNOSIS — G4731 Primary central sleep apnea: Secondary | ICD-10-CM | POA: Diagnosis not present

## 2015-05-28 DIAGNOSIS — G4733 Obstructive sleep apnea (adult) (pediatric): Secondary | ICD-10-CM | POA: Diagnosis not present

## 2015-06-03 ENCOUNTER — Telehealth: Payer: Self-pay | Admitting: Emergency Medicine

## 2015-06-03 DIAGNOSIS — G4733 Obstructive sleep apnea (adult) (pediatric): Secondary | ICD-10-CM

## 2015-06-03 NOTE — Telephone Encounter (Signed)
Spoke with pt's wife, states pt has never received sleep study results.  Results in epic.    RB please advise on sleep study results.  Thanks!

## 2015-06-03 NOTE — Telephone Encounter (Signed)
Please let him know that his sleep study was read today. This shows moderate obstructive sleep apnea and abnormal breathing and limb movements while he is sleeping. I believe he would benefit from using CPAP at night while he sleeps. If he agrees and would like for him to go back to the sleep lab for a CPAP titration study.

## 2015-06-03 NOTE — Progress Notes (Signed)
Patient Name: [name] [name] Date: 05/15/2015 Gender: Male D.O.B: 1935-07-04 Age (years): 80 Referring Provider: Baltazar Apo Height (inches): 27 Interpreting Physician: Chesley Mires MD, ABSM Weight (lbs): 198 RPSGT: Carolin Coy BMI: 28 MRN: [id number] Neck Size: 16.00 CLINICAL INFORMATION Sleep Study Type: Split Night CPAP Indication for sleep study: COPD, Diabetes, Hypertension, OSA, Snoring Epworth Sleepiness Score: 12 SLEEP STUDY TECHNIQUE As per the AASM Manual for the Scoring of Sleep and Associated Events v2.3 (April 2016) with a hypopnea requiring 4% desaturations. The channels recorded and monitored were frontal, central and occipital EEG, electrooculogram (EOG), submentalis EMG (chin), nasal and oral airflow, thoracic and abdominal wall motion, anterior tibialis EMG, snore microphone, electrocardiogram, and pulse oximetry. Continuous positive airway pressure (CPAP) was initiated when the patient met split night criteria and was titrated according to treat sleep-disordered breathing. MEDICATIONS Medications taken by the patient : reviewed in electronic medical record. Medications administered by patient during sleep study : No sleep medicine administered. RESPIRATORY PARAMETERS Diagnostic Total AHI (/hr): 18.9 RDI (/hr): 19.2 OA Index (/hr): 1.1 CA Index (/hr): 6.9 REM AHI (/hr): 0.0 NREM AHI (/hr): 22.7 Supine AHI (/hr): 84.4 Non-supine AHI (/hr): 12.71 Min O2 Sat (%): 84.00 Mean O2 (%): 93.19 Time below 88% (min): 1.9 Titration He was started on CPAP 5 and increased to 7 cm H2O. With CPAP at 7 cm H2O he had frequent central apneas [name] [name] D.O.B - 1935/03/26 Page 1 of 3 (CAI 18.7). He developed back pain during titration portion, and could not have further CPAP adjustments. Optimal Pressure (cm): AHI at Optimal Pressure (/hr): N/A Min O2 at Optimal Pressure (%): N/A Supine % at Optimal (%): N/A Sleep % at Optimal (%): N/A SLEEP  ARCHITECTURE The recording time for the entire night was 377.3 minutes. During a baseline period of 188.0 minutes, the patient slept for 165.5 minutes in REM and nonREM, yielding a sleep efficiency of 88.0%. Sleep onset after lights out was 0.2 minutes with a REM latency of 60.0 minutes. The patient spent 16.31% of the night in stage N1 sleep, 66.77% in stage N2 sleep, 0.00% in stage N3 and 16.92% in REM. During the titration period of 0.0 minutes, the patient slept for 0.0 minutes in REM and nonREM, yielding a sleep efficiency of N/A%. Sleep onset after CPAP initiation was N/A minutes with a REM latency of N/A minutes. The patient spent N/A% of the night in stage N1 sleep, N/A% in stage N2 sleep, N/A% in stage N3 and N/A% in REM. CARDIAC DATA The 2 lead EKG demonstrated sinus rhythm. The mean heart rate was 80.84 beats per minute. Other EKG findings include: PVCs. LEG MOVEMENT DATA The total Periodic Limb Movements of Sleep (PLMS) were 211. The PLMS index was 48.41 . IMPRESSIONS Moderate obstructive sleep apnea occurred during the diagnostic portion of the study(AHI = 18.9/hour). An optimal PAP pressure could not be selected for this patient based on the available study data. Mild central sleep apnea occurred during the diagnostic portion of the study (CAI = 6.9/hour). The patient snored with Moderate snoring volume during the diagnostic portion of the study. EKG findings include PVCs. Severe periodic limb movements of sleep occurred during the study. DIAGNOSIS Obstructive Sleep Apnea (327.23 [G47.33 ICD-10]) Central Sleep Apnea (G47.31) RECOMMENDATIONS He should return to sleep lab for full night CPAP titration study. Avoid alcohol, sedatives and other CNS depressants that may worsen sleep apnea and disrupt normal sleep architecture. Sleep hygiene should be reviewed to assess factors that may improve sleep quality. Weight  management and regular exercise should be initiated or  continued.   [Electronically signed] 05/29/2015 10:36 AM Chesley Mires MD, ABSM Diplomate, American Board of Sleep Medicine NPI: [id number]

## 2015-06-04 NOTE — Telephone Encounter (Signed)
Pt's wife is aware of sleep study results. They would like to proceed with the CPAP titration study. Order will be placed. Nothing further was needed at this time.

## 2015-06-17 ENCOUNTER — Telehealth: Payer: Self-pay | Admitting: Emergency Medicine

## 2015-06-17 NOTE — Telephone Encounter (Signed)
Called spoke w/ pt. He is requesting to pick up a copy of his sleep study. These has been placed for pick up. Nothing further needed

## 2015-06-18 ENCOUNTER — Ambulatory Visit (HOSPITAL_BASED_OUTPATIENT_CLINIC_OR_DEPARTMENT_OTHER): Payer: Medicare Other | Attending: Emergency Medicine | Admitting: Radiology

## 2015-06-18 DIAGNOSIS — G4730 Sleep apnea, unspecified: Secondary | ICD-10-CM | POA: Diagnosis present

## 2015-06-18 DIAGNOSIS — G4733 Obstructive sleep apnea (adult) (pediatric): Secondary | ICD-10-CM | POA: Diagnosis not present

## 2015-06-25 ENCOUNTER — Other Ambulatory Visit: Payer: Self-pay | Admitting: Emergency Medicine

## 2015-06-25 ENCOUNTER — Other Ambulatory Visit: Payer: Self-pay | Admitting: Cardiovascular Disease

## 2015-06-25 DIAGNOSIS — G4733 Obstructive sleep apnea (adult) (pediatric): Secondary | ICD-10-CM | POA: Diagnosis not present

## 2015-06-25 DIAGNOSIS — I10 Essential (primary) hypertension: Secondary | ICD-10-CM

## 2015-06-25 MED ORDER — NITROGLYCERIN 0.4 MG SL SUBL: 0.4000 mg | SUBLINGUAL_TABLET | SUBLINGUAL | Status: DC | PRN

## 2015-06-25 NOTE — Progress Notes (Signed)
  Patient Name: [name] [name], [name] [name] Date: 06/18/2015 Gender: Male D.O.B: [birth year] Age (years): 80 Referring Provider: Baltazar Apo Height (inches): 70 Interpreting Physician: Chesley Mires MD, ABSM Weight (lbs): 198 RPSGT: Carolin Coy BMI: 28 MRN: [id number] Neck Size: 16.00  CLINICAL INFORMATION The patient is referred for a CPAP titration to treat sleep apnea.  Date of NPSG: He had sleep study on 05/15/15 which showed an AHI of 18.9.  SLEEP STUDY TECHNIQUE As per the AASM Manual for the Scoring of Sleep and Associated Events v2.3 (April 2016) with a hypopnea requiring 4% desaturations.  The channels recorded and monitored were frontal, central and occipital EEG, electrooculogram (EOG), submentalis EMG (chin), nasal and oral airflow, thoracic and abdominal wall motion, anterior tibialis EMG, snore microphone, electrocardiogram, and pulse oximetry. Continuous positive airway pressure (CPAP) was initiated at the beginning of the study and titrated to treat sleep-disordered breathing.  MEDICATIONS Medications taken by the patient : reviewed in electronic medical record. Medications administered by patient during sleep study : He took chlorpheniramine on the night of the study.  TECHNICIAN COMMENTS Comments added by technician: Patient had difficulty maintaining/obtaining sleep in early am after restroom visit. Patient had difficulty initiating sleep.  This was associated with complaints of back discomfort. Patient was fitted with a Resmed Airfit F-10 nasal/oral full face mask size medium Comments added by scorer: N/A  RESPIRATORY PARAMETERS Optimal PAP Pressure (cm): 11 cm H2O AHI at Optimal Pressure (/hr): N/A Overall Minimal O2 (%): 89.00 Supine % at Optimal Pressure (%): N/A Minimal O2 at Optimal Pressure (%): 86.00      SLEEP ARCHITECTURE The study was initiated at 11:09:46 PM and ended at 5:02:53 AM.  Sleep onset time was 3.5 minutes and the sleep efficiency was 55.9%. The  total sleep time was 197.5 minutes.  The patient spent 20.25% of the night in stage N1 sleep, 71.39% in stage N2 sleep, 0.00% in stage N3 and 8.35% in REM.Stage REM latency was 72.5 minutes  Wake after sleep onset was 152.2. Alpha intrusion was absent. Supine sleep was 23.54%.  CARDIAC DATA The 2 lead EKG demonstrated sinus rhythm. The mean heart rate was 69.36 beats per minute. Other EKG findings include: None.  LEG MOVEMENT DATA The total Periodic Limb Movements of Sleep (PLMS) were 371. The PLMS index was 112.71. A PLMS index of <15 is considered normal in adults.  IMPRESSIONS His obstructive events were controlled with CPAP at 11 cm H2O.  He had compliants of back discomfort causing difficulty with sleep maintenance in the early morning hours.  This was associated with central apneas of sleep onset that resolved once he was able to maintain sleep.  The clinical significance of central sleep apnea of sleep onset is uncertain, and likely will not impact his therapy.  He did also have severe periodic limb movements observed during this study. However, arousals associated with PLMs were rare.  DIAGNOSIS Obstructive Sleep Apnea (327.23 [G47.33 ICD-10])  RECOMMENDATIONS I would recommend he start on CPAP at 11 cm H2O and monitor for his clinical response.   Chesley Mires, MD, [place], American Board of Sleep Medicine 06/25/2015, 8:20 AM  NPI: [id number]

## 2015-06-27 ENCOUNTER — Telehealth: Payer: Self-pay | Admitting: Cardiovascular Disease

## 2015-06-27 ENCOUNTER — Other Ambulatory Visit: Payer: Self-pay

## 2015-06-27 DIAGNOSIS — I10 Essential (primary) hypertension: Secondary | ICD-10-CM

## 2015-06-27 MED ORDER — NITROGLYCERIN 0.4 MG SL SUBL: 0.4000 mg | SUBLINGUAL_TABLET | SUBLINGUAL | Status: DC | PRN

## 2015-06-27 NOTE — Telephone Encounter (Signed)
Spoke with patient who explained that he recently had a visit with [facility] and they told him they noticed an irregular heart beat.  They asked him if he has ever had this problem in the past and to notify his cardiologist.  He states he feels fine, no complaints.  He does not have any further details.  He is scheduled for repeat fasting labs on 9/19.  I advised him that I can check an ekg on him at that same time unless he begins to notice problems prior to that.  I advised him to call back with questions or concerns.  He verbalized understanding and agreement.

## 2015-06-27 NOTE — Telephone Encounter (Signed)
Pt c/o medication issue: 1. Name of Medication: Potassium Chloride 2. How are you currently taking this medication (dosage and times per day)? Twice daily  3. Are you having a reaction (difficulty breathing--STAT)?  No  4. What is your medication issue? Pt called for a lab appointment. Made the appointment. Please put in order for lab if needed.

## 2015-06-27 NOTE — Telephone Encounter (Addendum)
Attempted to call patient back; there was no answer.  He was advised in June to call back in September to schedule fasting lab work if he started [employer].  The message below states the call was regarding potassium chloride.  The orders for lipid, hepatic, and basic metabolic panel are in epic and linked to patient's lab appointment.  I will attempt again to talk to patient to make certain he is calling back since he started the Crestor.

## 2015-06-27 NOTE — Telephone Encounter (Signed)
Agree with an ECG when he comes for lab work

## 2015-07-08 ENCOUNTER — Encounter: Payer: Self-pay | Admitting: Cardiovascular Disease

## 2015-07-08 ENCOUNTER — Telehealth: Payer: Self-pay | Admitting: Emergency Medicine

## 2015-07-08 ENCOUNTER — Ambulatory Visit (INDEPENDENT_AMBULATORY_CARE_PROVIDER_SITE_OTHER): Payer: Medicare Other | Admitting: Nurse Practitioner

## 2015-07-08 ENCOUNTER — Other Ambulatory Visit (INDEPENDENT_AMBULATORY_CARE_PROVIDER_SITE_OTHER): Payer: Medicare Other | Admitting: *Deleted

## 2015-07-08 DIAGNOSIS — I4510 Unspecified right bundle-branch block: Secondary | ICD-10-CM | POA: Diagnosis not present

## 2015-07-08 DIAGNOSIS — E785 Hyperlipidemia, unspecified: Secondary | ICD-10-CM

## 2015-07-08 DIAGNOSIS — I10 Essential (primary) hypertension: Secondary | ICD-10-CM

## 2015-07-08 LAB — BASIC METABOLIC PANEL
BUN: 16 mg/dL (ref 6–23)
CO2: 31 mEq/L (ref 19–32)
Calcium: 9.1 mg/dL (ref 8.4–10.5)
Chloride: 98 mEq/L (ref 96–112)
Creatinine, Ser: 0.87 mg/dL (ref 0.40–1.50)
GFR: 90.04 mL/min (ref >60.00)
Glucose, Bld: 154 mg/dL — ABNORMAL HIGH (ref 70–99)
Potassium: 3.5 mEq/L (ref 3.5–5.1)
Sodium: 136 mEq/L (ref 135–145)

## 2015-07-08 LAB — LIPID PANEL
Cholesterol: 124 mg/dL (ref 0–200)
HDL: 35.1 mg/dL — ABNORMAL LOW (ref >39.00)
LDL Cholesterol: 66 mg/dL (ref 0–99)
NonHDL: 89.32
Total CHOL/HDL Ratio: 4
Triglycerides: 118 mg/dL (ref 0.0–149.0)
VLDL: 23.6 mg/dL (ref 0.0–40.0)

## 2015-07-08 LAB — HEPATIC FUNCTION PANEL
ALT: 14 U/L (ref 0–53)
AST: 12 U/L (ref 0–37)
Albumin: 3.9 g/dL (ref 3.5–5.2)
Alkaline Phosphatase: 64 U/L (ref 39–117)
Bilirubin, Direct: 0 mg/dL (ref 0.0–0.3)
Total Bilirubin: 0.5 mg/dL (ref 0.2–1.2)
Total Protein: 6.9 g/dL (ref 6.0–8.3)

## 2015-07-08 NOTE — Telephone Encounter (Signed)
Patient came in for lab work today and ekg was done to evaluate his irregular heart beat.  Dr. Acie Fredrickson, who is in the office, confirmed that ekg shows known right bundle branch block with occasional pac's and pvc's.  I  Informed patient who verbalized understanding and gratitude for the verification.  I advised him to keep follow-up as scheduled and to call back with questions or concerns.

## 2015-07-08 NOTE — Addendum Note (Signed)
Addended by: Eulis Foster on: 07/08/2015 09:12 AM   Modules accepted: Orders

## 2015-07-08 NOTE — Telephone Encounter (Signed)
Spoke with pt's wife and informed that copy of sleep study was left at front desk to be picked up.

## 2015-07-15 NOTE — Progress Notes (Signed)
Patient presents for ekg for reported irregular heart rate by nurse during exam for insurance purposes.  EKG reviewed by Dr. Acie Fredrickson and reveals sinus rhythm with PVC's, PAC's and known RBBB; no changes since previous tracing.  Patient aware to follow-up as directed at last office visit and has appointment for December.  He verbalized understanding and agreement with plan of care and was discharged in NAD.

## 2015-07-22 ENCOUNTER — Encounter (HOSPITAL_BASED_OUTPATIENT_CLINIC_OR_DEPARTMENT_OTHER): Payer: Medicare Other

## 2015-07-30 ENCOUNTER — Encounter (HOSPITAL_BASED_OUTPATIENT_CLINIC_OR_DEPARTMENT_OTHER): Payer: Medicare Other

## 2015-08-22 ENCOUNTER — Other Ambulatory Visit: Payer: Self-pay | Admitting: Adult Health

## 2015-08-22 ENCOUNTER — Other Ambulatory Visit: Payer: Self-pay | Admitting: Cardiovascular Disease

## 2015-09-09 ENCOUNTER — Telehealth: Payer: Self-pay | Admitting: Cardiovascular Disease

## 2015-09-09 NOTE — Telephone Encounter (Signed)
New Message  Pt requested to speak w/ RN concerning Crestor and other med (pt wife did not remember name). Please call back and discuss.

## 2015-09-09 NOTE — Telephone Encounter (Signed)
Spoke with patient's wife who states insurance will not pay for Crestor in December and wanted to know if we have samples.  I advised that no samples are available because drug has gone generic and advised her to call pharmacy for pricing.  I advised that if patient needs to take 1/2 of 10 mg tablet for the month of December that that would be better than patient stopping.  I advised her to call back with questions or concerns.  She verbalized understanding and agreement.

## 2015-09-19 ENCOUNTER — Telehealth: Payer: Self-pay | Admitting: Emergency Medicine

## 2015-09-19 MED ORDER — FLUTICASONE FUROATE-VILANTEROL 100-25 MCG/INH IN AEPB: 1.0000 | INHALATION_SPRAY | Freq: Every day | RESPIRATORY_TRACT | Status: DC

## 2015-09-19 NOTE — Telephone Encounter (Signed)
Pt wife aware that Breo 100 samples are being placed up front. Reports he is taking it 1 puff daily . Nothing further needed.

## 2015-10-03 ENCOUNTER — Other Ambulatory Visit (INDEPENDENT_AMBULATORY_CARE_PROVIDER_SITE_OTHER): Payer: Medicare Other | Admitting: *Deleted

## 2015-10-03 DIAGNOSIS — I10 Essential (primary) hypertension: Secondary | ICD-10-CM | POA: Diagnosis not present

## 2015-10-03 DIAGNOSIS — E785 Hyperlipidemia, unspecified: Secondary | ICD-10-CM

## 2015-10-03 LAB — HEPATIC FUNCTION PANEL
ALT: 14 U/L (ref 9–46)
AST: 15 U/L (ref 10–35)
Albumin: 4.2 g/dL (ref 3.6–5.1)
Alkaline Phosphatase: 56 U/L (ref 40–115)
Bilirubin, Direct: 0.1 mg/dL (ref <=0.2)
Indirect Bilirubin: 0.4 mg/dL (ref 0.2–1.2)
Total Bilirubin: 0.5 mg/dL (ref 0.2–1.2)
Total Protein: 7.1 g/dL (ref 6.1–8.1)

## 2015-10-03 LAB — BASIC METABOLIC PANEL
BUN: 15 mg/dL (ref 7–25)
CO2: 28 mmol/L (ref 20–31)
Calcium: 9.4 mg/dL (ref 8.6–10.3)
Chloride: 98 mmol/L (ref 98–110)
Creat: 1.08 mg/dL (ref 0.70–1.18)
Glucose, Bld: 122 mg/dL — ABNORMAL HIGH (ref 65–99)
Potassium: 4 mmol/L (ref 3.5–5.3)
Sodium: 136 mmol/L (ref 135–146)

## 2015-10-03 LAB — LIPID PANEL
Cholesterol: 123 mg/dL — ABNORMAL LOW (ref 125–200)
HDL: 40 mg/dL (ref >=40)
LDL Cholesterol: 53 mg/dL (ref <130)
Total CHOL/HDL Ratio: 3.1 Ratio (ref <=5.0)
Triglycerides: 149 mg/dL (ref <150)
VLDL: 30 mg/dL (ref <30)

## 2015-10-03 NOTE — Addendum Note (Signed)
Addended by: Eulis Foster on: 10/03/2015 08:16 AM   Modules accepted: Orders

## 2015-10-07 ENCOUNTER — Ambulatory Visit (INDEPENDENT_AMBULATORY_CARE_PROVIDER_SITE_OTHER): Payer: Medicare Other | Admitting: Cardiovascular Disease

## 2015-10-07 ENCOUNTER — Encounter: Payer: Self-pay | Admitting: Cardiovascular Disease

## 2015-10-07 VITALS — BP 120/96 | HR 98 | Ht 70.0 in | Wt 209.0 lb

## 2015-10-07 DIAGNOSIS — E785 Hyperlipidemia, unspecified: Secondary | ICD-10-CM | POA: Diagnosis not present

## 2015-10-07 DIAGNOSIS — I10 Essential (primary) hypertension: Secondary | ICD-10-CM | POA: Diagnosis not present

## 2015-10-07 MED ORDER — ATORVASTATIN CALCIUM 20 MG PO TABS: 20.0000 mg | ORAL_TABLET | Freq: Every day | ORAL | Status: DC

## 2015-10-07 NOTE — Patient Instructions (Signed)
Medication Instructions:  Your physician recommends that you continue on your current medications as directed. Please refer to the Current Medication list given to you today. Call Sharyn Lull to verify your cholesterol medicine   Labwork: Your physician recommends that you return for lab work in: 6 months on the day of or a few days before your office visit with Dr. Acie Fredrickson.  You will need to FAST for this appointment - nothing to eat or drink after midnight the night before except water.   Testing/Procedures: None Ordered   Follow-Up: Your physician wants you to follow-up in: 6 months with Dr. Acie Fredrickson.  You will receive a reminder letter in the mail two months in advance. If you don't receive a letter, please call our office to schedule the follow-up appointment.  If you need a refill on your cardiac medications before your next appointment, please call your pharmacy.   Thank you for choosing [facility]! Christen Bame, RN [phone]

## 2015-10-07 NOTE — Progress Notes (Signed)
[name] [name] Date of Birth  1935/04/10       [facility]    [employer] [address] [place], [place], [place]  [address]   [place], [place]  [address] [phone]     [phone]   Fax  [phone]    Fax [phone]  Problem List: 1. Hypertension 2. Orthostatic hypotension 3. Hypercholesterolemia 4. Prostate cancer 5. Dyslipidemia 6. Diabetes Mellitus 7. RBBB  History of Present Illness: [name] [name] is a 80 year old gentleman with a history of hypertension. Also has intermittent episodes of orthostatic hypotension. He also has a history of hypercholesterolemia and history of prostate cancer.  [name] [name] has had problems with fatigue.  He is limited by his arthritis and back pain.  He denies any chest pain or dyspnea.  He wakes up gasping for air frequently.  His symptoms sound like sleep apnea. He has lots of postnasal drip and also has asthma. He did not take his blood pressure medicines today because he's fasting. This may explain his mild blood pressure elevation.  Mar 17, 2013:  [name] [name] is doing OK from a cardiac standpoint.  He is having some asthma problems.  No CP.   He quit smoking his pipe.  His BP is typically well controlled - it is a bit high today b/c he has not taken his meds yet this am.  Dec. 1, 2014:  [name] [name] was diagnosed with diabetes mellitus recently.  He has been recording his BP.    March 28, 2014: [name] [name] is doing ok.  He was on vacation last week and ate lots of foods with salt.  That may explain his HTN today.  No CP  Dec. 7, 2015:  [name] [name] is a 80 yo who we follow for HTN, hyperlipidemia. He states that he is not doing well.  Having trouble with controlling his diabetes. Has lost 17 lbs - wants to lost 15 more. Eating low carb bread.  Has lots of back pain - has been going to Dr. Jeanie Cooks - pain doctor.  X-rays have found lots of arthritis in his back.   April 09, 2015:  [name] [name] is doing ok. Not exercising much.  Trigs are  a bit elevated.   Otherwise, labs are ok Eats lots of bread. , pasta, potatoes.   Dec. 19, 2016: Doing well.   BP has been well controlled.     Current Outpatient Prescriptions on File Prior to Visit  Medication Sig Dispense Refill  . acetaminophen (TYLENOL) 500 MG tablet Take 500 mg by mouth 2 (two) times daily as needed.    Marland Kitchen albuterol (PROAIR HFA) 108 (90 BASE) MCG/ACT inhaler Inhale 2 puffs into the lungs every 6 (six) hours as needed for wheezing. 3 Inhaler 3  . benzonatate (TESSALON) 200 MG capsule Take 200 mg by mouth 3 (three) times daily as needed for cough.    . Blood Glucose Monitoring Suppl (TRUERESULT BLOOD GLUCOSE) W/DEVICE KIT     . chlorpheniramine (CHLOR-TRIMETON) 4 MG tablet Take 4 mg by mouth 2 (two) times daily as needed for allergies.    . famotidine (PEPCID) 20 MG tablet Take 20 mg by mouth 2 (two) times daily.    . finasteride (PROSCAR) 5 MG tablet Take 5 mg by mouth daily.      . fluticasone (FLONASE) 50 MCG/ACT nasal spray Place 2 sprays into the nose daily.     . Fluticasone Furoate-Vilanterol (BREO ELLIPTA) 100-25 MCG/INH AEPB Inhale 1 puff into the  lungs daily. 28 each 0  . losartan (COZAAR) 50 MG tablet TAKE 1 TABLET BY MOUTH DAILY. 90 tablet 2  . magnesium oxide (MAG-OX) 400 MG tablet Take 400 mg by mouth daily.    . Melatonin 5 MG SUBL Place 5 mg under the tongue at bedtime.    . nitroGLYCERIN (NITROSTAT) 0.4 MG SL tablet Place 1 tablet (0.4 mg total) under the tongue every 5 (five) minutes as needed (up to three doses, if pain continues call 911). 25 tablet 11  . pantoprazole (PROTONIX) 40 MG tablet TAKE 1 TABLET BY MOUTH DAILY. 90 tablet 0  . potassium chloride SA (K-DUR,KLOR-CON) 20 MEQ tablet TAKE 1 TABLET BY MOUTH TWICE DAILY 180 tablet 3  . rosuvastatin (CRESTOR) 10 MG tablet Take 1 tablet (10 mg total) by mouth daily. 90 tablet 3  . triamterene-hydrochlorothiazide (DYAZIDE) 37.5-25 MG capsule TAKE 1 CAPSULE BY MOUTH EVERY MORNING 90 capsule 2  .  TRUEPLUS LANCETS 28G MISC     . TRUETEST TEST test strip      No current facility-administered medications on file prior to visit.    Allergies  Allergen Reactions  . Doxycycline Monohydrate Hives  . Fluticasone-Salmeterol     REACTION: sensation of throat closing  . Lisinopril Cough  . Penicillins Hives  . Prednisone     Per WP:VXYIAXK agitation  . Shark Cartilage   . Tetracyclines & Related Hives    Past Medical History  Diagnosis Date  . Hypertension   . Hyperlipidemia   . History of orthostatic hypotension   . Prostate cancer ([place])   . BPH (benign prostatic hypertrophy)   . MI (myocardial infarction) ([place])   . Asthma   . Rheumatic fever   . Diabetes ([place])   . Sleep apnea     Past Surgical History  Procedure Laterality Date  . Appendectomy  1954  . Hernia repair    . Vasectomy      History  Smoking status  . Former Smoker -- 1.00 packs/day for 30 years  . Types: Cigarettes, Pipe  . Quit date: 10/19/2012  Smokeless tobacco  . Never Used    Comment: 1 pipe every 3 days, quit smoking cig 12 yrs ago 07/06/13    History  Alcohol Use No    Comment: no etoh in 12 years    Family History  Problem Relation Age of Onset  . Heart failure Mother   . Arthritis Other   . Diabetes Other   . Heart disease Other   . Hyperlipidemia Other   . Kidney disease Other   . Obesity Other     Reviw of Systems:  Reviewed in the HPI.  All other systems are negative.  Physical Exam: Blood pressure 120/96, pulse 98, height _0  (1.778 m), weight 209 lb (94.802 kg), SpO2 98 %. General: Well developed, well nourished, in no acute distress.  Head: Normocephalic, atraumatic, sclera non-icteric, mucus membranes are moist,   Neck: Supple. Carotids are 2 + without bruits. No JVD  Lungs: Clear bilaterally to auscultation.  Heart: regular rate.  normal  S1 S2. No murmurs, gallops or rubs.  Abdomen: Soft, non-tender, non-distended with normal bowel sounds. No hepatomegaly.  No rebound/guarding. No masses.  Msk:  Strength and tone are normal  Extremities: No clubbing or cyanosis. No edema.  Distal pedal pulses are 2+ and equal bilaterally.  Neuro: Alert and oriented X 3. Moves all extremities spontaneously.  Psych:  Responds to questions appropriately with a normal affect.  ECG:  Assessment / Plan:   1. Hypertension -   his blood pressure remains fairly well-controlled at home. Continue current medications.   2. Orthostatic hypotension 3. Hypercholesterolemia -  Recent labs are good.   4. Prostate cancer 6. Diabetes Mellitus 7. RBBB 8.  Depression :   Is now on Cymbalta.     Suni Jarnagin, Wonda Cheng, MD  10/07/2015 8:39 AM    [address],  [place], [place]  [address] Pager [phone] Phone: [phone]; Fax: [phone]   [facility]  [address] [place], [place]  [address] [phone]    Fax [phone]

## 2015-10-21 ENCOUNTER — Other Ambulatory Visit: Payer: Self-pay | Admitting: Emergency Medicine

## 2015-10-22 ENCOUNTER — Telehealth: Payer: Self-pay | Admitting: Cardiovascular Disease

## 2015-10-22 NOTE — Telephone Encounter (Signed)
Spoke to patient who states he called to make [place] aware that rosuvastatin is now available in generic form at [facility].  I thanked him for the information.

## 2015-10-22 NOTE — Telephone Encounter (Signed)
New message ° ° ° ° ° °Talk to the nurse.  Pt would not tell me what he wanted °

## 2015-11-15 ENCOUNTER — Other Ambulatory Visit: Payer: Self-pay | Admitting: Emergency Medicine

## 2015-11-19 ENCOUNTER — Telehealth: Payer: Self-pay | Admitting: Nurse Practitioner

## 2015-11-19 NOTE — Telephone Encounter (Signed)
Called patient to inform him that blood pressure readings that were dropped off at the office were within normal range and to continue current medications per Dr. Acie Fredrickson.  BP readings range from 120's to [id number] mmHg systolic and [id number] to [id number] mmHg diastolic.  Heart rate ranges from 70 bpm to 100 bpm.  He thanked me for the call

## 2016-01-13 ENCOUNTER — Other Ambulatory Visit: Payer: Self-pay | Admitting: Emergency Medicine

## 2016-03-19 ENCOUNTER — Telehealth: Payer: Self-pay | Admitting: Emergency Medicine

## 2016-03-19 NOTE — Telephone Encounter (Signed)
Spoke with pt's wife. They are requesting sample of Breo 100. Pt will start getting meds from [place] but not until next week and he is out of Breo. Sample left at front desk. Wife aware. Nothing further needed.

## 2016-03-26 ENCOUNTER — Encounter: Payer: Self-pay | Admitting: Cardiovascular Disease

## 2016-04-02 ENCOUNTER — Ambulatory Visit (HOSPITAL_BASED_OUTPATIENT_CLINIC_OR_DEPARTMENT_OTHER): Payer: Medicare Other | Admitting: Certified Registered"

## 2016-04-02 ENCOUNTER — Encounter (HOSPITAL_BASED_OUTPATIENT_CLINIC_OR_DEPARTMENT_OTHER)
Admission: RE | Disposition: A | Discharge status: Home / Self Care | Payer: Self-pay | Source: Ambulatory Visit | Attending: Orthopedic Surgery

## 2016-04-02 ENCOUNTER — Encounter (HOSPITAL_BASED_OUTPATIENT_CLINIC_OR_DEPARTMENT_OTHER): Payer: Self-pay | Admitting: Certified Registered"

## 2016-04-02 ENCOUNTER — Ambulatory Visit (HOSPITAL_BASED_OUTPATIENT_CLINIC_OR_DEPARTMENT_OTHER)
Admission: RE | Admit: 2016-04-02 | Discharge: 2016-04-02 | Disposition: A | Discharge status: Home / Self Care | Payer: Medicare Other | Source: Ambulatory Visit | Attending: Orthopedic Surgery | Admitting: Orthopedic Surgery

## 2016-04-02 DIAGNOSIS — I10 Essential (primary) hypertension: Secondary | ICD-10-CM | POA: Insufficient documentation

## 2016-04-02 DIAGNOSIS — J449 Chronic obstructive pulmonary disease, unspecified: Secondary | ICD-10-CM | POA: Insufficient documentation

## 2016-04-02 DIAGNOSIS — J45909 Unspecified asthma, uncomplicated: Secondary | ICD-10-CM | POA: Diagnosis not present

## 2016-04-02 DIAGNOSIS — Z7982 Long term (current) use of aspirin: Secondary | ICD-10-CM | POA: Diagnosis not present

## 2016-04-02 DIAGNOSIS — Z87891 Personal history of nicotine dependence: Secondary | ICD-10-CM | POA: Insufficient documentation

## 2016-04-02 DIAGNOSIS — N400 Enlarged prostate without lower urinary tract symptoms: Secondary | ICD-10-CM | POA: Diagnosis not present

## 2016-04-02 DIAGNOSIS — Z7984 Long term (current) use of oral hypoglycemic drugs: Secondary | ICD-10-CM | POA: Diagnosis not present

## 2016-04-02 DIAGNOSIS — Z79899 Other long term (current) drug therapy: Secondary | ICD-10-CM | POA: Diagnosis not present

## 2016-04-02 DIAGNOSIS — E119 Type 2 diabetes mellitus without complications: Secondary | ICD-10-CM | POA: Diagnosis not present

## 2016-04-02 DIAGNOSIS — Z880 Allergy status to penicillin: Secondary | ICD-10-CM | POA: Diagnosis not present

## 2016-04-02 DIAGNOSIS — L03011 Cellulitis of right finger: Secondary | ICD-10-CM | POA: Diagnosis present

## 2016-04-02 DIAGNOSIS — Z8546 Personal history of malignant neoplasm of prostate: Secondary | ICD-10-CM | POA: Insufficient documentation

## 2016-04-02 DIAGNOSIS — I252 Old myocardial infarction: Secondary | ICD-10-CM | POA: Diagnosis not present

## 2016-04-02 DIAGNOSIS — E785 Hyperlipidemia, unspecified: Secondary | ICD-10-CM | POA: Insufficient documentation

## 2016-04-02 HISTORY — PX: INCISION AND DRAINAGE ABSCESS: SHX5864

## 2016-04-02 SURGERY — INCISION AND DRAINAGE, ABSCESS
Anesthesia: Monitor Anesthesia Care | Site: Hand | Laterality: Right

## 2016-04-02 MED ORDER — LIDOCAINE HCL 1 % IJ SOLN
INTRAMUSCULAR | Status: DC | PRN
  Administered 2016-04-02: 5 mL

## 2016-04-02 MED ORDER — CHLORHEXIDINE GLUCONATE 4 % EX LIQD: 60.0000 mL | Freq: Once | CUTANEOUS | Status: DC

## 2016-04-02 MED ORDER — SCOPOLAMINE 1 MG/3DAYS TD PT72: 1.0000 | MEDICATED_PATCH | Freq: Once | TRANSDERMAL | Status: DC | PRN

## 2016-04-02 MED ORDER — FENTANYL CITRATE (PF) 100 MCG/2ML IJ SOLN: 25.0000 ug | INTRAMUSCULAR | Status: DC | PRN

## 2016-04-02 MED ORDER — LACTATED RINGERS IV SOLN
INTRAVENOUS | Status: DC
  Administered 2016-04-02 (×2): via INTRAVENOUS

## 2016-04-02 MED ORDER — LIDOCAINE HCL (PF) 1 % IJ SOLN
INTRAMUSCULAR | Status: AC
  Filled 2016-04-02: qty 30

## 2016-04-02 MED ORDER — ONDANSETRON HCL 4 MG/2ML IJ SOLN
INTRAMUSCULAR | Status: AC
  Filled 2016-04-02: qty 2

## 2016-04-02 MED ORDER — VANCOMYCIN HCL 1000 MG IV SOLR
1000.0000 mg | INTRAVENOUS | Status: DC | PRN
  Administered 2016-04-02: 1000 mg via INTRAVENOUS

## 2016-04-02 MED ORDER — BUPIVACAINE HCL (PF) 0.25 % IJ SOLN
INTRAMUSCULAR | Status: DC | PRN
  Administered 2016-04-02: 5 mL

## 2016-04-02 MED ORDER — VANCOMYCIN HCL IN DEXTROSE 1-5 GM/200ML-% IV SOLN
INTRAVENOUS | Status: AC
  Filled 2016-04-02: qty 200

## 2016-04-02 MED ORDER — PROPOFOL 10 MG/ML IV BOLUS
INTRAVENOUS | Status: DC | PRN
  Administered 2016-04-02: 30 mg via INTRAVENOUS

## 2016-04-02 MED ORDER — HYDROCODONE-ACETAMINOPHEN 5-325 MG PO TABS: ORAL_TABLET | ORAL | Status: DC

## 2016-04-02 MED ORDER — ONDANSETRON HCL 4 MG/2ML IJ SOLN
4.0000 mg | Freq: Once | INTRAMUSCULAR | Status: AC | PRN
  Administered 2016-04-02: 4 mg via INTRAVENOUS

## 2016-04-02 MED ORDER — FENTANYL CITRATE (PF) 100 MCG/2ML IJ SOLN
50.0000 ug | INTRAMUSCULAR | Status: DC | PRN
  Administered 2016-04-02: 50 ug via INTRAVENOUS
  Administered 2016-04-02: 25 ug via INTRAVENOUS

## 2016-04-02 MED ORDER — LIDOCAINE 2% (20 MG/ML) 5 ML SYRINGE
INTRAMUSCULAR | Status: AC
  Filled 2016-04-02: qty 5

## 2016-04-02 MED ORDER — GLYCOPYRROLATE 0.2 MG/ML IJ SOLN: 0.2000 mg | Freq: Once | INTRAMUSCULAR | Status: DC | PRN

## 2016-04-02 MED ORDER — SULFAMETHOXAZOLE-TRIMETHOPRIM 800-160 MG PO TABS: 1.0000 | ORAL_TABLET | Freq: Two times a day (BID) | ORAL | Status: DC

## 2016-04-02 MED ORDER — MIDAZOLAM HCL 2 MG/2ML IJ SOLN: 1.0000 mg | INTRAMUSCULAR | Status: DC | PRN

## 2016-04-02 MED ORDER — FENTANYL CITRATE (PF) 100 MCG/2ML IJ SOLN
INTRAMUSCULAR | Status: AC
  Filled 2016-04-02: qty 2

## 2016-04-02 SURGICAL SUPPLY — 48 items
BAG DECANTER FOR FLEXI CONT (MISCELLANEOUS) IMPLANT
BANDAGE ACE 3X5.8 VEL STRL LF (GAUZE/BANDAGES/DRESSINGS) IMPLANT
BLADE MINI RND TIP GREEN BEAV (BLADE) IMPLANT
BLADE SURG 15 STRL LF DISP TIS (BLADE) ×2 IMPLANT
BLADE SURG 15 STRL SS (BLADE) ×6
BNDG CMPR 9X4 STRL LF SNTH (GAUZE/BANDAGES/DRESSINGS) ×1
BNDG COHESIVE 1X5 TAN STRL LF (GAUZE/BANDAGES/DRESSINGS) ×2 IMPLANT
BNDG ELASTIC 2X5.8 VLCR STR LF (GAUZE/BANDAGES/DRESSINGS) IMPLANT
BNDG ESMARK 4X9 LF (GAUZE/BANDAGES/DRESSINGS) ×2 IMPLANT
BNDG GAUZE 1X2.1 STRL (MISCELLANEOUS) IMPLANT
BNDG GAUZE ELAST 4 BULKY (GAUZE/BANDAGES/DRESSINGS) IMPLANT
CHLORAPREP W/TINT 26ML (MISCELLANEOUS) ×3 IMPLANT
CORDS BIPOLAR (ELECTRODE) ×3 IMPLANT
COVER BACK TABLE 60X90IN (DRAPES) ×3 IMPLANT
COVER MAYO STAND STRL (DRAPES) ×3 IMPLANT
CUFF TOURNIQUET SINGLE 18IN (TOURNIQUET CUFF) ×3 IMPLANT
DRAPE EXTREMITY T 121X128X90 (DRAPE) ×3 IMPLANT
DRAPE SURG 17X23 STRL (DRAPES) ×3 IMPLANT
GAUZE PACKING IODOFORM 1/4X15 (GAUZE/BANDAGES/DRESSINGS) ×2 IMPLANT
GAUZE SPONGE 4X4 12PLY STRL (GAUZE/BANDAGES/DRESSINGS) ×3 IMPLANT
GAUZE XEROFORM 1X8 LF (GAUZE/BANDAGES/DRESSINGS) ×3 IMPLANT
GLOVE BIO SURGEON STRL SZ7.5 (GLOVE) ×3 IMPLANT
GLOVE BIOGEL PI IND STRL 8 (GLOVE) ×1 IMPLANT
GLOVE BIOGEL PI INDICATOR 8 (GLOVE) ×2
GOWN STRL REUS W/ TWL LRG LVL3 (GOWN DISPOSABLE) ×1 IMPLANT
GOWN STRL REUS W/TWL LRG LVL3 (GOWN DISPOSABLE) ×3
GOWN STRL REUS W/TWL XL LVL3 (GOWN DISPOSABLE) ×3 IMPLANT
LOOP VESSEL MAXI BLUE (MISCELLANEOUS) IMPLANT
NDL HYPO 25X1 1.5 SAFETY (NEEDLE) IMPLANT
NEEDLE HYPO 25X1 1.5 SAFETY (NEEDLE) IMPLANT
NS IRRIG 1000ML POUR BTL (IV SOLUTION) ×3 IMPLANT
PACK BASIN DAY SURGERY FS (CUSTOM PROCEDURE TRAY) ×3 IMPLANT
PAD CAST 3X4 CTTN HI CHSV (CAST SUPPLIES) IMPLANT
PADDING CAST ABS 4INX4YD NS (CAST SUPPLIES)
PADDING CAST ABS COTTON 4X4 ST (CAST SUPPLIES) ×1 IMPLANT
PADDING CAST COTTON 3X4 STRL (CAST SUPPLIES)
SPLINT FINGER 4.25 BULB 911906 (SOFTGOODS) ×2 IMPLANT
SPLINT PLASTER CAST XFAST 3X15 (CAST SUPPLIES) IMPLANT
SPLINT PLASTER XTRA FASTSET 3X (CAST SUPPLIES)
STOCKINETTE 4X48 STRL (DRAPES) ×3 IMPLANT
SUT ETHILON 4 0 PS 2 18 (SUTURE) IMPLANT
SWAB COLLECTION DEVICE MRSA (MISCELLANEOUS) ×2 IMPLANT
SWAB CULTURE ESWAB REG 1ML (MISCELLANEOUS) IMPLANT
SYR BULB 3OZ (MISCELLANEOUS) ×3 IMPLANT
SYR CONTROL 10ML LL (SYRINGE) ×2 IMPLANT
TOWEL OR 17X24 6PK STRL BLUE (TOWEL DISPOSABLE) ×6 IMPLANT
TUBE FEEDING 5FR 15 INCH (TUBING) IMPLANT
UNDERPAD 30X30 (UNDERPADS AND DIAPERS) ×3 IMPLANT

## 2016-04-02 NOTE — Discharge Instructions (Addendum)

## 2016-04-02 NOTE — Transfer of Care (Signed)
Immediate Anesthesia Transfer of Care Note  Patient: [name] [name]  Procedure(s) Performed: Procedure(s): INCISION AND DRAINAGE ABSCESS (Right)  Patient Location: PACU  Anesthesia Type:MAC  Level of Consciousness: awake, alert , oriented and patient cooperative  Airway & Oxygen Therapy: Patient Spontanous Breathing and Patient connected to face mask oxygen  Post-op Assessment: Report given to RN and Post -op Vital signs reviewed and stable  Post vital signs: Reviewed and stable  Last Vitals: There were no vitals filed for this visit.  Last Pain: There were no vitals filed for this visit.       Complications: No apparent anesthesia complications

## 2016-04-02 NOTE — Anesthesia Procedure Notes (Signed)
Procedure Name: MAC Date/Time: 04/02/2016 4:37 PM Performed by: Baxter Flattery Pre-anesthesia Checklist: Patient identified, Emergency Drugs available, Suction available and Patient being monitored Patient Re-evaluated:Patient Re-evaluated prior to inductionOxygen Delivery Method: Simple face mask Preoxygenation: Pre-oxygenation with 100% oxygen Intubation Type: IV induction Ventilation: Mask ventilation without difficulty

## 2016-04-02 NOTE — Anesthesia Postprocedure Evaluation (Signed)
Anesthesia Post Note  Patient: ARBAZ RUMORE  Procedure(s) Performed: Procedure(s) (LRB): INCISION AND DRAINAGE ABSCESS (Right)  Patient location during evaluation: PACU Anesthesia Type: MAC Level of consciousness: awake and alert Pain management: pain level controlled Vital Signs Assessment: post-procedure vital signs reviewed and stable Respiratory status: spontaneous breathing, nonlabored ventilation, respiratory function stable and patient connected to nasal cannula oxygen Cardiovascular status: stable and blood pressure returned to baseline Anesthetic complications: no    Last Vitals:  Filed Vitals:   04/02/16 1735 04/02/16 1746  BP:  159/91  Pulse: 82 67  Temp:  36.8 C  Resp: 18 18    Last Pain: There were no vitals filed for this visit.               Catalina Gravel

## 2016-04-02 NOTE — Brief Op Note (Signed)
04/02/2016  5:00 PM  PATIENT:  [name] [name]  81 y.o. male  PRE-OPERATIVE DIAGNOSIS:   Right long finger infected   POST-OPERATIVE DIAGNOSIS:   Right long finger infected   PROCEDURE:  Procedure(s): INCISION AND DRAINAGE ABSCESS (Right)  SURGEON:  Surgeon(s) and Role:    * Leanora Cover, MD - Primary  PHYSICIAN ASSISTANT:   ASSISTANTS: none   ANESTHESIA:   local and MAC  EBL:  Total I/O In: 100 [I.V.:100] Out: -   BLOOD ADMINISTERED:none  DRAINS: iodoform packing  LOCAL MEDICATIONS USED:  MARCAINE    and LIDOCAINE   SPECIMEN:  Source of Specimen:  right long finger  DISPOSITION OF SPECIMEN:  micro  COUNTS:  YES  TOURNIQUET:   Total Tourniquet Time Documented: Forearm (Right) - 9 minutes Total: Forearm (Right) - 9 minutes   DICTATION: .Other Dictation: Dictation Number [id number]  PLAN OF CARE: Discharge to home after PACU  PATIENT DISPOSITION:  PACU - hemodynamically stable.

## 2016-04-02 NOTE — H&P (Signed)
[name] [name] is an 81 y.o. male.   Chief Complaint: right long finger infection HPI: 81 yo rhd male present with family member.  Stated right long finger has been becoming more swollen, erythematous, and painful over past two days.  No fevers, chills, night sweats.  He rates the pain at 5/10.  It is worsened with palpation.  He has been doing saltwater soaks without relief.    Allergies:  Allergies  Allergen Reactions  . Doxycycline Monohydrate Hives  . Fluticasone-Salmeterol     REACTION: sensation of throat closing  . Lisinopril Cough  . Penicillins Hives  . Prednisone     Per AY:TKZSWFU agitation  . Shark Cartilage   . Tetracyclines & Related Hives    Past Medical History  Diagnosis Date  . Hypertension   . Hyperlipidemia   . History of orthostatic hypotension   . Prostate cancer ([place])   . BPH (benign prostatic hypertrophy)   . MI (myocardial infarction) ([place])   . Asthma   . Rheumatic fever   . Diabetes ([place])   . Sleep apnea     Past Surgical History  Procedure Laterality Date  . Appendectomy  1954  . Hernia repair    . Vasectomy      Family History: Family History  Problem Relation Age of Onset  . Heart failure Mother   . Arthritis Other   . Diabetes Other   . Heart disease Other   . Hyperlipidemia Other   . Kidney disease Other   . Obesity Other     Social History:   reports that he quit smoking about 3 years ago. His smoking use included Cigarettes and Pipe. He has a 30 pack-year smoking history. He has never used smokeless tobacco. He reports that he does not drink alcohol or use illicit drugs.  Medications: Medications Prior to Admission  Medication Sig Dispense Refill  . acetaminophen (TYLENOL) 500 MG tablet Take 500 mg by mouth 2 (two) times daily as needed.    Marland Kitchen albuterol (PROAIR HFA) 108 (90 BASE) MCG/ACT inhaler Inhale 2 puffs into the lungs every 6 (six) hours as needed for wheezing. 3 Inhaler 3  . aspirin 81 MG tablet Take 81 mg by mouth  daily.    . benzonatate (TESSALON) 200 MG capsule Take 200 mg by mouth 3 (three) times daily as needed for cough.    Marland Kitchen BREO ELLIPTA 100-25 MCG/INH AEPB USE 2 PUFFS BY MOUTH DAILY FOR FIFTEEN DAYS 60 each 3  . DULoxetine (CYMBALTA) 30 MG capsule Take 30 mg by mouth at bedtime. TO HELP WITH SLEEP    . fexofenadine (ALLEGRA) 30 MG tablet Take 30 mg by mouth 2 (two) times daily.    . finasteride (PROSCAR) 5 MG tablet Take 5 mg by mouth daily.      . fluticasone (FLONASE) 50 MCG/ACT nasal spray Place 2 sprays into the nose daily.     . Fluticasone Furoate-Vilanterol (BREO ELLIPTA) 100-25 MCG/INH AEPB Inhale 1 puff into the lungs daily. 28 each 0  . losartan (COZAAR) 50 MG tablet TAKE 1 TABLET BY MOUTH DAILY. 90 tablet 2  . magnesium oxide (MAG-OX) 400 MG tablet Take 400 mg by mouth daily.    . pantoprazole (PROTONIX) 40 MG tablet TAKE 1 TABLET BY MOUTH DAILY. 90 tablet 0  . potassium chloride SA (K-DUR,KLOR-CON) 20 MEQ tablet TAKE 1 TABLET BY MOUTH TWICE DAILY 180 tablet 3  . rosuvastatin (CRESTOR) 10 MG tablet Take 1 tablet (10 mg total) by  mouth daily. 90 tablet 3  . triamterene-hydrochlorothiazide (DYAZIDE) 37.5-25 MG capsule TAKE 1 CAPSULE BY MOUTH EVERY MORNING 90 capsule 2  . atorvastatin (LIPITOR) 20 MG tablet Take 1 tablet (20 mg total) by mouth daily. 90 tablet 3  . Blood Glucose Monitoring Suppl (TRUERESULT BLOOD GLUCOSE) W/DEVICE KIT     . chlorpheniramine (CHLOR-TRIMETON) 4 MG tablet Take 4 mg by mouth 2 (two) times daily as needed for allergies.    . famotidine (PEPCID) 20 MG tablet Take 20 mg by mouth 2 (two) times daily.    . Melatonin 5 MG SUBL Place 5 mg under the tongue at bedtime.    . nitroGLYCERIN (NITROSTAT) 0.4 MG SL tablet Place 1 tablet (0.4 mg total) under the tongue every 5 (five) minutes as needed (up to three doses, if pain continues call 911). 25 tablet 11  . TRUEPLUS LANCETS 28G MISC     . TRUETEST TEST test strip       No results found for this or any previous visit  (from the past 48 hour(s)).  No results found.   A comprehensive review of systems was negative. Review of Systems: No fevers, chills, night sweats, chest pain, shortness of breath, nausea, vomiting, diarrhea, constipation, easy bleeding or bruising, headaches, dizziness, vision changes, fainting.   There were no vitals taken for this visit.  General appearance: alert, cooperative and appears stated age Head: Normocephalic, without obvious abnormality, atraumatic Neck: supple, symmetrical, trachea midline Resp: clear to auscultation bilaterally Cardio: regular rate and rhythm GI: non-tender Extremities: Intact sensation and capillary refill all digits.  +epl/fpl/io.  No wounds. Swelling and erythema of distal phalanx right long finger with purulence under nail. Pulses: 2+ and symmetric Skin: Skin color, texture, turgor normal. No rashes or lesions Neurologic: Grossly normal Incision/Wound: none  Assessment/Plan Right long finger felon and paronychia. I recommended incision and drainage in the OR with removal of the nail plate.  Risks, benefits, and alternatives of surgery were discussed and the patient agrees with the plan of care.   Khani Paino R 04/02/2016, 4:19 PM

## 2016-04-02 NOTE — Op Note (Signed)
NAMERogelio [name] NO.:  [id number]  MEDICAL RECORD NO.:  [id number]  LOCATION:                                 FACILITY:  PHYSICIAN:  [name] Cover, MD             DATE OF BIRTH:  DATE OF PROCEDURE:  04/02/2016 DATE OF DISCHARGE:                              OPERATIVE REPORT   PREOPERATIVE DIAGNOSIS:  Right long finger felon and paronychia.  POSTOPERATIVE DIAGNOSIS:  Right long finger felon and paronychia.  PROCEDURE:   1. Right long finger incision and drainage of felon  2. Right long finger drainage of paronychia with removal of the nail plate.  SURGEON:  [name] Cover, MD.  ASSISTANT:  None.  ANESTHESIA:  Local with sedation.  IV FLUIDS:  Per anesthesia flow sheet.  ESTIMATED BLOOD LOSS:  Minimal.  COMPLICATIONS:  None.  SPECIMENS:  Cultures to Micro.  TOURNIQUET TIME:  9 minutes.  DISPOSITION:  Stable to PACU.  INDICATIONS:  The patient is a 81 year old male who over the past two days has an increasing swelling, pain, and erythema of the right long fingertip.  No fevers, chills, or night sweats.  He was [name] by his primary care physician today and referred to me for further care.  I recommended incision and drainage in the operating room.  Risks, benefits, and alternatives of the surgery were discussed including the risk of blood loss; infection; damage to nerves, vessels, tendons, ligaments, bone; failure of surgery; need for additional surgery; complications with wound healing; continued pain; and continued infection, need for repeat irrigation and debridement.  He voiced understanding of these risks and elected to proceed.  OPERATIVE COURSE:  After being identified preoperatively by myself, the patient and I agreed upon the procedure and site of the procedure. Surgical site was marked.  The risks, benefits, and alternatives of surgery were reviewed and he wished to proceed.  Surgical consent had been signed.  Antibiotics were held for  cultures.  He was transferred to the operating room, placed on the operating table in supine position with the right upper extremity on arm board.  A surgical pause was performed between surgeons, Anesthesia, and operating staff; and all were in agreement as to the patient, procedure, and site of procedure. A digital block was performed with 10 mL of half and half solution of 1% plain lidocaine and 0.25% plain Marcaine.  Sedation was given by the anesthesiologist.  Right upper extremity was prepped and draped in normal sterile orthopedic fashion.  Surgical pause was performed between surgeons, Anesthesia, and operating room staff; and all were in agreement as to the patient, procedure, and site of procedure. Tourniquet at the proximal aspect of the forearm was inflated to 250 mmHg after exsanguination of the limb with an Esmarch bandage.  A Freer elevator was used to elevate the nail plate.  There was gross purulence underneath it.  Cultures were taken for aerobes, anaerobes, and Gram stain.  The nail plate was removed.  There was tracking of the abscess at the tip of the finger.  The vitalized epidermis was  removed sharply with the scissors.  An incision was made at the ulnar side of the distal phalanx and carried into subcutaneous tissues by spreading technique. The scissors were used separately.  There was viscus fluid within the pad but no gross purulence.  The wounds were copiously irrigated with sterile saline.  A piece of Xeroform was placed in the nail fold. Iodoform gauze was used to pack the wound.  The nail bed was then dressed with sterile Xeroform, and wounds were all dressed with sterile 4x4s and wrapped with a Coban dressing.  Tourniquet was deflated at 9 minutes.  The fingertips were pink with brisk capillary refill after deflation of the tourniquet.  The operative drapes were broken down. The patient was awoken from anesthesia safely.  He was transferred back to stretcher  and taken to PACU in stable condition.  I will see him back in the office in four days for postoperative followup.  I will give him Norco 5/325, 1-2 p.o. q.6 hours p.r.n. pain, dispensed #20 and Bactrim DS one p.o. b.i.d. x7 days.     [name] Cover, MD     KK/MEDQ  D:  04/02/2016  T:  04/02/2016  Job:  [id number]

## 2016-04-02 NOTE — Op Note (Signed)
[id number] 

## 2016-04-02 NOTE — Anesthesia Preprocedure Evaluation (Addendum)
Anesthesia Evaluation    Airway Mallampati: II  TM Distance: >3 FB Neck ROM: Full    Dental  (+) Teeth Intact, Dental Advisory Given Permanent bridge upper:   Pulmonary asthma , COPD,  COPD inhaler, former smoker,    Pulmonary exam normal breath sounds clear to auscultation       Cardiovascular hypertension, Pt. on medications + Past MI  Normal cardiovascular exam+ dysrhythmias (RBBB)  Rhythm:Regular Rate:Normal     Neuro/Psych negative neurological ROS  negative psych ROS   GI/Hepatic   Endo/Other  diabetes, Type 2, Oral Hypoglycemic Agents  Renal/GU      Musculoskeletal   Abdominal   Peds  Hematology   Anesthesia Other Findings   Reproductive/Obstetrics                            Anesthesia Physical Anesthesia Plan  ASA: III  Anesthesia Plan: MAC   Post-op Pain Management:    Induction: Intravenous  Airway Management Planned: Nasal Cannula  Additional Equipment:   Intra-op Plan:   Post-operative Plan:   Informed Consent: I have reviewed the patients History and Physical, chart, labs and discussed the procedure including the risks, benefits and alternatives for the proposed anesthesia with the patient or authorized representative who has indicated his/her understanding and acceptance.   Dental advisory given  Plan Discussed with: CRNA and Anesthesiologist  Anesthesia Plan Comments: (Discussed risks/benefits/alternatives to MAC sedation including need for ventilatory support, hypotension, need for conversion to general anesthesia.  All patient questions answered.  Patient/guardian wishes to proceed.  MAC plus digital nerve block.)        Anesthesia Quick Evaluation

## 2016-04-03 ENCOUNTER — Encounter (HOSPITAL_BASED_OUTPATIENT_CLINIC_OR_DEPARTMENT_OTHER): Payer: Self-pay | Admitting: Orthopedic Surgery

## 2016-04-06 DIAGNOSIS — L03011 Cellulitis of right finger: Secondary | ICD-10-CM

## 2016-04-06 DIAGNOSIS — L03012 Cellulitis of left finger: Secondary | ICD-10-CM

## 2016-04-06 HISTORY — DX: Cellulitis of right finger: L03.011

## 2016-04-06 HISTORY — DX: Cellulitis of left finger: L03.012

## 2016-04-07 LAB — AEROBIC/ANAEROBIC CULTURE (SURGICAL/DEEP WOUND)

## 2016-04-07 LAB — AEROBIC/ANAEROBIC CULTURE W GRAM STAIN (SURGICAL/DEEP WOUND)

## 2016-04-14 ENCOUNTER — Other Ambulatory Visit: Payer: Medicare Other

## 2016-04-17 ENCOUNTER — Ambulatory Visit (INDEPENDENT_AMBULATORY_CARE_PROVIDER_SITE_OTHER): Payer: Medicare Other | Admitting: Cardiovascular Disease

## 2016-04-17 ENCOUNTER — Encounter: Payer: Self-pay | Admitting: Cardiovascular Disease

## 2016-04-17 VITALS — BP 140/90 | HR 80 | Ht 70.5 in | Wt 202.8 lb

## 2016-04-17 DIAGNOSIS — E785 Hyperlipidemia, unspecified: Secondary | ICD-10-CM | POA: Diagnosis not present

## 2016-04-17 DIAGNOSIS — I10 Essential (primary) hypertension: Secondary | ICD-10-CM | POA: Diagnosis not present

## 2016-04-17 MED ORDER — HYDROCODONE-ACETAMINOPHEN 5-325 MG PO TABS: ORAL_TABLET | ORAL | Status: DC

## 2016-04-17 MED ORDER — ROSUVASTATIN CALCIUM 10 MG PO TABS
10.0000 mg | ORAL_TABLET | Freq: Every day | ORAL | Status: DC
Start: 2016-04-17 — End: 2016-10-20

## 2016-04-17 NOTE — Patient Instructions (Addendum)
Medication Instructions:  Please stop atorvastatin.  Take rosuvastatin. Continue all other medications as listed.  Follow-Up: Follow up in 6 months with Dr. Acie Fredrickson.  You will receive a letter in the mail 2 months before you are due.  Please call us when you receive this letter to schedule your follow up appointment.  If you need a refill on your cardiac medications before your next appointment, please call your pharmacy.  Thank you for choosing [facility]!!

## 2016-04-17 NOTE — Progress Notes (Signed)
[name] Date of Birth  [birth year]       [facility]    [employer] [address], [place], [place]  [address]   [place], [place]  [address] [phone]     [phone]   Fax  [phone]    Fax [phone]  Problem List: 1. Hypertension 2. Orthostatic hypotension 3. Hypercholesterolemia 4. Prostate cancer 5. Dyslipidemia 6. Diabetes Mellitus 7. RBBB  History of Present Illness: [name] [name] is a 81 year old gentleman with a history of hypertension. Also has intermittent episodes of orthostatic hypotension. He also has a history of hypercholesterolemia and history of prostate cancer.  [name] [name] has had problems with fatigue.  He is limited by his arthritis and back pain.  He denies any chest pain or dyspnea.  He wakes up gasping for air frequently.  His symptoms sound like sleep apnea. He has lots of postnasal drip and also has asthma. He did not take his blood pressure medicines today because he's fasting. This may explain his mild blood pressure elevation.  Mar 17, 2013:  [name] [name] is doing OK from a cardiac standpoint.  He is having some asthma problems.  No CP.   He quit smoking his pipe.  His BP is typically well controlled - it is a bit high today b/c he has not taken his meds yet this am.  Dec. 1, 2014:  [name] [name] was diagnosed with diabetes mellitus recently.  He has been recording his BP.    March 28, 2014: [name] [name] is doing ok.  He was on vacation last week and ate lots of foods with salt.  That may explain his HTN today.  No CP  Dec. 7, 2015:  [name] [name] is a 81 yo who we follow for HTN, hyperlipidemia. He states that he is not doing well.  Having trouble with controlling his diabetes. Has lost 17 lbs - wants to lost 15 more. Eating low carb bread.  Has lots of back pain - has been going to Dr. Jeanie Cooks - pain doctor.  X-rays have found lots of arthritis in his back.   April 09, 2015:  [name] [name] is doing ok. Not exercising much.  Trigs are  a bit elevated.   Otherwise, labs are ok Eats lots of bread. , pasta, potatoes.   Dec. 19, 2016: Doing well.   BP has been well controlled.   April 17, 2016:  Doing well from a heart standpoint Having some back pain - may be contributing to his HTN Is  Brought labs from the [place] -  Chol = 126 Trig=98 HDL = 48 LDL = 58   Vit D = 22   Current Outpatient Prescriptions on File Prior to Visit  Medication Sig Dispense Refill  . acetaminophen (TYLENOL) 500 MG tablet Take 500 mg by mouth 2 (two) times daily as needed.    Marland Kitchen albuterol (PROAIR HFA) 108 (90 BASE) MCG/ACT inhaler Inhale 2 puffs into the lungs every 6 (six) hours as needed for wheezing. 3 Inhaler 3  . aspirin 81 MG tablet Take 81 mg by mouth daily.    Marland Kitchen atorvastatin (LIPITOR) 20 MG tablet Take 1 tablet (20 mg total) by mouth daily. 90 tablet 3  . benzonatate (TESSALON) 200 MG capsule Take 200 mg by mouth 3 (three) times daily as needed for cough.    . Blood Glucose Monitoring Suppl (TRUERESULT BLOOD GLUCOSE) W/DEVICE KIT     . BREO ELLIPTA 100-25 MCG/INH AEPB USE 2 PUFFS  BY MOUTH DAILY FOR FIFTEEN DAYS 60 each 3  . DULoxetine (CYMBALTA) 30 MG capsule Take 30 mg by mouth at bedtime. TO HELP WITH SLEEP    . famotidine (PEPCID) 20 MG tablet Take 20 mg by mouth 2 (two) times daily.    . fexofenadine (ALLEGRA) 30 MG tablet Take 30 mg by mouth 2 (two) times daily.    . finasteride (PROSCAR) 5 MG tablet Take 5 mg by mouth daily.      . fluticasone (FLONASE) 50 MCG/ACT nasal spray Place 2 sprays into the nose daily.     Marland Kitchen HYDROcodone-acetaminophen (NORCO) 5-325 MG tablet 1-2 tabs po q6 hours prn pain 20 tablet 0  . losartan (COZAAR) 50 MG tablet TAKE 1 TABLET BY MOUTH DAILY. 90 tablet 2  . magnesium oxide (MAG-OX) 400 MG tablet Take 400 mg by mouth daily.    . nitroGLYCERIN (NITROSTAT) 0.4 MG SL tablet Place 1 tablet (0.4 mg total) under the tongue every 5 (five) minutes as needed (up to three doses, if pain continues call 911). 25  tablet 11  . pantoprazole (PROTONIX) 40 MG tablet TAKE 1 TABLET BY MOUTH DAILY. 90 tablet 0  . potassium chloride SA (K-DUR,KLOR-CON) 20 MEQ tablet TAKE 1 TABLET BY MOUTH TWICE DAILY 180 tablet 3  . rosuvastatin (CRESTOR) 10 MG tablet Take 1 tablet (10 mg total) by mouth daily. 90 tablet 3  . sulfamethoxazole-trimethoprim (BACTRIM DS) 800-160 MG tablet Take 1 tablet by mouth 2 (two) times daily. 14 tablet 0  . triamterene-hydrochlorothiazide (DYAZIDE) 37.5-25 MG capsule TAKE 1 CAPSULE BY MOUTH EVERY MORNING 90 capsule 2  . TRUEPLUS LANCETS 28G MISC     . TRUETEST TEST test strip      No current facility-administered medications on file prior to visit.    Allergies  Allergen Reactions  . Doxycycline Monohydrate Hives  . Fluticasone-Salmeterol     REACTION: sensation of throat closing  . Lisinopril Cough  . Penicillins Hives  . Prednisone     Per AC:ZYSAYTK agitation  . Shark Cartilage   . Tetracyclines & Related Hives    Past Medical History  Diagnosis Date  . Hypertension   . Hyperlipidemia   . History of orthostatic hypotension   . Prostate cancer ([place])   . BPH (benign prostatic hypertrophy)   . MI (myocardial infarction) ([place])   . Asthma   . Rheumatic fever   . Diabetes ([place])   . Sleep apnea     Past Surgical History  Procedure Laterality Date  . Appendectomy  1954  . Hernia repair    . Vasectomy    . Incision and drainage abscess Right 04/02/2016    Procedure: INCISION AND DRAINAGE ABSCESS;  Surgeon: Leanora Cover, MD;  Location: [facility];  Service: Orthopedics;  Laterality: Right;    History  Smoking status  . Former Smoker -- 1.00 packs/day for 30 years  . Types: Cigarettes, Pipe  . Quit date: 10/19/2012  Smokeless tobacco  . Never Used    Comment: 1 pipe every 3 days, quit smoking cig 12 yrs ago 07/06/13    History  Alcohol Use No    Comment: no etoh in 12 years    Family History  Problem Relation Age of Onset  . Heart failure Mother    . Arthritis Other   . Diabetes Other   . Heart disease Other   . Hyperlipidemia Other   . Kidney disease Other   . Obesity Other  Reviw of Systems:  Reviewed in the HPI.  All other systems are negative.  Physical Exam: Blood pressure 140/90, pulse 80, height 5' 10.5" (1.791 m), weight 202 lb 12.8 oz (91.989 kg). General: Well developed, well nourished, in no acute distress.  Head: Normocephalic, atraumatic, sclera non-icteric, mucus membranes are moist,  Neck: Supple. Carotids are 2 + without bruits. No JVD Lungs: Clear bilaterally to auscultation. Heart: regular rate.  normal  S1 S2. No murmurs, gallops or rubs. Abdomen: Soft, non-tender, non-distended with normal bowel sounds. No hepatomegaly. No rebound/guarding. No masses. Msk:  Strength and tone are normal Extremities: No clubbing or cyanosis. No edema.  Distal pedal pulses are 2+ and equal bilaterally. Neuro: Alert and oriented X 3. Moves all extremities spontaneously. Psych:  Responds to questions appropriately with a normal affect.  ECG:  Assessment / Plan:   1. Hypertension -   his blood pressure remains fairly well-controlled at home. Continue current medications.   2. Orthostatic hypotension 3. Hypercholesterolemia -  Recent labs are good.  He is on Rosuvastatin   4. Prostate cancer 6. Diabetes Mellitus 7. RBBB 8.  Depression :   Is now on Cymbalta.     Mertie Moores, MD  04/17/2016 8:18 AM    [address],  [place], [place]  [address] Pager [phone] Phone: [phone]; Fax: [phone]

## 2016-04-20 ENCOUNTER — Telehealth: Payer: Self-pay | Admitting: Cardiovascular Disease

## 2016-04-20 NOTE — Telephone Encounter (Signed)
Walk In pt form-patient dropped of medication list-[name] [name] back Thursday 7/6

## 2016-06-18 DIAGNOSIS — Z8546 Personal history of malignant neoplasm of prostate: Secondary | ICD-10-CM | POA: Insufficient documentation

## 2016-07-24 ENCOUNTER — Telehealth: Payer: Self-pay | Admitting: Cardiovascular Disease

## 2016-07-24 DIAGNOSIS — I77819 Aortic ectasia, unspecified site: Secondary | ICD-10-CM

## 2016-07-24 NOTE — Telephone Encounter (Signed)
Spoke with patient who states xray of his back was done by PCP and he was told he has an aortic aneurysm.  He would like a referral to a Psychologist, sport and exercise.  I advised him that Dr. Acie Fredrickson will review the films and make referral if appropriate.  I advised him that I will call him back Monday.  He denies symptoms of chest pain at this time.  States he has a hx of arthritis in his back.  He thanked me for the call.

## 2016-07-24 NOTE — Telephone Encounter (Signed)
Mr.Barrows is calling he is wanting Mr.Nahser to give him a referral to Vascular and vein.Thanks

## 2016-07-27 NOTE — Telephone Encounter (Signed)
New Message ° °Pt voiced returning nurses call. ° °Please f/u °

## 2016-07-27 NOTE — Telephone Encounter (Signed)
Spoke with Sydell Axon at [facility] regarding patient's request for Dr. Acie Fredrickson to refer him to a vascular surgeon for an aneurysm.  Sydell Axon states she sent a referral to [facility] when patient was in the office and remembers that patient requests that Dr. Acie Fredrickson advise on which surgeon to see.  I advised that depending on where the aneurysm is located will determine which group of surgeons patient should see.  She states she will forward MRI results to our office to my attention.  I thanked her for her help.

## 2016-07-28 ENCOUNTER — Telehealth: Payer: Self-pay | Admitting: Cardiovascular Disease

## 2016-07-28 MED ORDER — TRIAMTERENE-HCTZ 37.5-25 MG PO CAPS: 1.0000 | ORAL_CAPSULE | Freq: Every morning | ORAL | 3 refills | Status: DC

## 2016-07-28 NOTE — Telephone Encounter (Signed)
Spoke with patient and advised that Dr. Acie Fredrickson has reviewed MRI results and would like to order an aortic ultrasound for better evaluation of aorta.  Patient verbalized understanding and agreement and is aware someone will call him to schedule

## 2016-07-28 NOTE — Telephone Encounter (Signed)
Spoke with patient and his wife who called with concerns about patient's elevated BP.  He reports he recently started lumigan drops for glaucoma and wonders if this has an adverse effect on his BP.  I advised that I do not see that lumigan contains an agent that would cause elevation in BP. He reports the following readings:  162/92 Sat 10/7 @ 0800 170/93 Sun 10/8 @ 0800 105/71 Mon 10/9 @1730  170/95 today @ 0800  He reports that these readings were taken approximately 2 hours after taking his BP medication except for the one taken in the evenig.  He states he gets his medication from the [place] and that recently the appearance of the Triamterene/HCTZ pills changed.  He states current Rx says: hctz 50/triam 75 mg and it is a tablet.   Patient's previous bottle states: Triamterene/HCTZ 37.5/25 mg (Dyazide) and it is a capsule I advised that we sent a new Rx of the dyazide to a local pharmacy and that he take these and monitor BP to see if there is a difference.  He verbalized understanding and agreement and requests #90 to [facility].  I advised him to call back to report BP readings in a couple of weeks.  He thanked me for the call.

## 2016-07-28 NOTE — Telephone Encounter (Signed)
Agree with notes by Army Melia, RN.

## 2016-07-28 NOTE — Telephone Encounter (Signed)
Pt c/o BP issue:  1. What are your last 5 BP readings? BP running high into the 170's 2. Are you having any other symptoms (ex. Dizziness, headache, blurred vision, passed out)? dizziness 3. What is your medication issue? None  Wife [name] [phone]

## 2016-07-29 ENCOUNTER — Telehealth: Payer: Self-pay | Admitting: Cardiovascular Disease

## 2016-07-29 NOTE — Telephone Encounter (Signed)
Spoke with patient's wife who called to ask if patient can take medications prior to vascular ultrasound scheduled for tomorrow.  I advised that he may take all medications prior to that appointment.  She states he picked up his new Rx of Dyazide yesterday and took it this morning; states systolic BP was improved.  She thanked me for the call.

## 2016-07-29 NOTE — Telephone Encounter (Signed)
New Message  Pt wife call requesting to speak with RN. Pt wife states pt has a procedure on 10/12 and would like to know if pt needs to take meds before procedure. Please call back to discuss

## 2016-07-30 ENCOUNTER — Ambulatory Visit (HOSPITAL_COMMUNITY)
Admission: RE | Admit: 2016-07-30 | Discharge: 2016-07-30 | Disposition: A | Discharge status: Home / Self Care | Payer: Medicare Other | Source: Ambulatory Visit | Attending: Cardiovascular Disease | Admitting: Cardiovascular Disease

## 2016-07-30 DIAGNOSIS — E119 Type 2 diabetes mellitus without complications: Secondary | ICD-10-CM | POA: Insufficient documentation

## 2016-07-30 DIAGNOSIS — I10 Essential (primary) hypertension: Secondary | ICD-10-CM | POA: Diagnosis not present

## 2016-07-30 DIAGNOSIS — E785 Hyperlipidemia, unspecified: Secondary | ICD-10-CM | POA: Insufficient documentation

## 2016-07-30 DIAGNOSIS — Z87891 Personal history of nicotine dependence: Secondary | ICD-10-CM | POA: Diagnosis not present

## 2016-07-30 DIAGNOSIS — J449 Chronic obstructive pulmonary disease, unspecified: Secondary | ICD-10-CM | POA: Insufficient documentation

## 2016-07-30 DIAGNOSIS — I77819 Aortic ectasia, unspecified site: Secondary | ICD-10-CM

## 2016-07-30 DIAGNOSIS — I708 Atherosclerosis of other arteries: Secondary | ICD-10-CM | POA: Insufficient documentation

## 2016-07-30 DIAGNOSIS — I700 Atherosclerosis of aorta: Secondary | ICD-10-CM | POA: Diagnosis not present

## 2016-07-31 ENCOUNTER — Telehealth: Payer: Self-pay | Admitting: Emergency Medicine

## 2016-07-31 MED ORDER — FLUTICASONE FUROATE-VILANTEROL 100-25 MCG/INH IN AEPB: 1.0000 | INHALATION_SPRAY | Freq: Every day | RESPIRATORY_TRACT | 0 refills | Status: DC

## 2016-07-31 NOTE — Telephone Encounter (Signed)
Called and spoke with pt and he stated that he gets his meds from the [facility] [place].  He stated that he tried to get this refilled from them and they had a mix up and have him as being deceased since Jan 23, 2011.  Pt thinks that they got this fixed now but he is almost out of his breo.  He will come 01/23/2023 and pick up the samples that were left up front.

## 2016-08-05 ENCOUNTER — Telehealth: Payer: Self-pay | Admitting: Cardiovascular Disease

## 2016-08-05 NOTE — Telephone Encounter (Signed)
Wife is calling because her husband is still having problems with his blood pressure.

## 2016-08-05 NOTE — Telephone Encounter (Signed)
Please ask if he is eating salty foods.  I suspect that he is Increase Losartan to 100 mg a day Check BMP in 3 weeks Continue current dose of Maxzide

## 2016-08-05 NOTE — Telephone Encounter (Signed)
Spoke with patient who states BP continues to be elevated despite new Rx for Dyazide Readings are approximately 2 hours after taking medications   10/12 147/96, 80 10/14 134/81, 76 10/15 114/73, 97 10/16 149/85, 76 10/17 158/97, 85 10/18 152/95, 83  He states he gets a headache on the top of his head when his BP is high and he has had a headache the past couple of days.  I advised that I will discuss with Dr. Acie Fredrickson and call him back with his advice.  He verbalized understanding and agreement with plan.

## 2016-08-06 MED ORDER — LOSARTAN POTASSIUM 100 MG PO TABS: 100.0000 mg | ORAL_TABLET | Freq: Every day | ORAL | 3 refills | Status: DC

## 2016-08-06 NOTE — Telephone Encounter (Signed)
Spoke with patient's wife who states patient tries to limit intake of salt.  She admits that they have eaten a few things recently that she knows are high in sodium.  I advised her to have patient limit intake to 2000 mg per day or less.  I advised her to have him increase losartan to 100 mg daily.  His wife requests Rx be sent to [facility].  I advised her to have patient continue to monitor BP and heart rate and to cal back to report if he does not see an improvement or with questions or concerns.  She verbalized understanding and agreement and thanked me for the call.

## 2016-08-17 ENCOUNTER — Encounter (INDEPENDENT_AMBULATORY_CARE_PROVIDER_SITE_OTHER): Payer: Medicare Other | Admitting: Ophthalmology

## 2016-08-17 DIAGNOSIS — H43813 Vitreous degeneration, bilateral: Secondary | ICD-10-CM | POA: Diagnosis not present

## 2016-08-17 DIAGNOSIS — H2513 Age-related nuclear cataract, bilateral: Secondary | ICD-10-CM

## 2016-08-17 DIAGNOSIS — I10 Essential (primary) hypertension: Secondary | ICD-10-CM | POA: Diagnosis not present

## 2016-08-17 DIAGNOSIS — H35033 Hypertensive retinopathy, bilateral: Secondary | ICD-10-CM | POA: Diagnosis not present

## 2016-08-17 DIAGNOSIS — H353112 Nonexudative age-related macular degeneration, right eye, intermediate dry stage: Secondary | ICD-10-CM

## 2016-08-17 DIAGNOSIS — H353121 Nonexudative age-related macular degeneration, left eye, early dry stage: Secondary | ICD-10-CM

## 2016-08-27 ENCOUNTER — Telehealth: Payer: Self-pay | Admitting: Cardiovascular Disease

## 2016-08-27 NOTE — Telephone Encounter (Signed)
Agree with note from Christen Bame, RN Agree with addition of amlodipine at this point

## 2016-08-27 NOTE — Telephone Encounter (Signed)
Follow Up:     Pt's blood pressure is still high,please call today if possible.She said you were aware that she would be calling back,if pt was still having problems with his blood pressure.

## 2016-08-27 NOTE — Telephone Encounter (Signed)
Spoke with patient who states his BP has been poor for the past couple of weeks.  Saw his PCP, Dr. Fara Olden, yesterday for evaluation of blood glucose levels.  States she advised him to start Amlodipine 5 mg once daily at bedtime.  He reports systolic BP [id number] - [id number] mmHg and diastolic [id number] mmHg.  He is scheduled to follow-up with her in 1 month. He called to ask if Dr. Acie Fredrickson agrees with this plan.  I reassured him that I think Dr. Acie Fredrickson would agree with plan and I am routing message to him for his awareness.  I advised that a bmet is needed if not done by PCP. He states he had no lab work done and is scheduled for bmet Monday 11/13.  He thanked me for the call.

## 2016-08-31 ENCOUNTER — Other Ambulatory Visit: Payer: Medicare Other | Admitting: *Deleted

## 2016-08-31 DIAGNOSIS — E785 Hyperlipidemia, unspecified: Secondary | ICD-10-CM

## 2016-08-31 DIAGNOSIS — I10 Essential (primary) hypertension: Secondary | ICD-10-CM

## 2016-08-31 LAB — LIPID PANEL
Cholesterol: 134 mg/dL (ref <200)
HDL: 38 mg/dL — ABNORMAL LOW (ref >40)
LDL Cholesterol: 67 mg/dL (ref <100)
Total CHOL/HDL Ratio: 3.5 Ratio (ref <5.0)
Triglycerides: 147 mg/dL (ref <150)
VLDL: 29 mg/dL (ref <30)

## 2016-08-31 LAB — COMPREHENSIVE METABOLIC PANEL
ALT: 11 U/L (ref 9–46)
AST: 12 U/L (ref 10–35)
Albumin: 4.2 g/dL (ref 3.6–5.1)
Alkaline Phosphatase: 57 U/L (ref 40–115)
BUN: 18 mg/dL (ref 7–25)
CO2: 31 mmol/L (ref 20–31)
Calcium: 9.1 mg/dL (ref 8.6–10.3)
Chloride: 98 mmol/L (ref 98–110)
Creat: 0.8 mg/dL (ref 0.70–1.18)
Glucose, Bld: 114 mg/dL — ABNORMAL HIGH (ref 65–99)
Potassium: 3.8 mmol/L (ref 3.5–5.3)
Sodium: 135 mmol/L (ref 135–146)
Total Bilirubin: 0.4 mg/dL (ref 0.2–1.2)
Total Protein: 6.7 g/dL (ref 6.1–8.1)

## 2016-09-21 ENCOUNTER — Ambulatory Visit (INDEPENDENT_AMBULATORY_CARE_PROVIDER_SITE_OTHER): Payer: Medicare Other | Admitting: Cardiovascular Disease

## 2016-09-21 ENCOUNTER — Encounter: Payer: Self-pay | Admitting: Cardiovascular Disease

## 2016-09-21 VITALS — BP 132/74 | HR 73 | Ht 70.5 in | Wt 202.0 lb

## 2016-09-21 DIAGNOSIS — J45901 Unspecified asthma with (acute) exacerbation: Secondary | ICD-10-CM | POA: Insufficient documentation

## 2016-09-21 DIAGNOSIS — K219 Gastro-esophageal reflux disease without esophagitis: Secondary | ICD-10-CM | POA: Insufficient documentation

## 2016-09-21 DIAGNOSIS — E119 Type 2 diabetes mellitus without complications: Secondary | ICD-10-CM | POA: Insufficient documentation

## 2016-09-21 DIAGNOSIS — I10 Essential (primary) hypertension: Secondary | ICD-10-CM | POA: Diagnosis not present

## 2016-09-21 DIAGNOSIS — E782 Mixed hyperlipidemia: Secondary | ICD-10-CM

## 2016-09-21 DIAGNOSIS — E7800 Pure hypercholesterolemia, unspecified: Secondary | ICD-10-CM | POA: Insufficient documentation

## 2016-09-21 DIAGNOSIS — J45909 Unspecified asthma, uncomplicated: Secondary | ICD-10-CM | POA: Insufficient documentation

## 2016-09-21 HISTORY — DX: Gastro-esophageal reflux disease without esophagitis: K21.9

## 2016-09-21 HISTORY — DX: Type 2 diabetes mellitus without complications: E11.9

## 2016-09-21 NOTE — Progress Notes (Signed)
[name] [name] Date of Birth  June 12, 1935       [facility]    [employer] [address], [address], [place], [place]  [address]   [place], [place]  [address] [phone]     [phone]   Fax  [phone]    Fax [phone]  Problem List: 1. Hypertension 2. Orthostatic hypotension 3. Hypercholesterolemia 4. Prostate cancer 5. Dyslipidemia 6. Diabetes Mellitus 7. RBBB  [name] [name] is a 81 year old gentleman with a history of hypertension. Also has intermittent episodes of orthostatic hypotension. He also has a history of hypercholesterolemia and history of prostate cancer.  [name] [name] has had problems with fatigue.  He is limited by his arthritis and back pain.  He denies any chest pain or dyspnea.  He wakes up gasping for air frequently.  His symptoms sound like sleep apnea. He has lots of postnasal drip and also has asthma. He did not take his blood pressure medicines today because he's fasting. This may explain his mild blood pressure elevation.  Mar 17, 2013:  [name] [name] is doing OK from a cardiac standpoint.  He is having some asthma problems.  No CP.   He quit smoking his pipe.  His BP is typically well controlled - it is a bit high today b/c he has not taken his meds yet this am.  Dec. 1, 2014:  [name] [name] was diagnosed with diabetes mellitus recently.  He has been recording his BP.    March 28, 2014: [name] [name] is doing ok.  He was on vacation last week and ate lots of foods with salt.  That may explain his HTN today.  No CP  Dec. 7, 2015:  [name] [name] is a 81 yo who we follow for HTN, hyperlipidemia. He states that he is not doing well.  Having trouble with controlling his diabetes. Has lost 17 lbs - wants to lost 15 more. Eating low carb bread.  Has lots of back pain - has been going to Dr. Jeanie Cooks - pain doctor.  X-rays have found lots of arthritis in his back.   April 09, 2015:  [name] [name] is doing ok. Not exercising much.  Trigs are a bit elevated.   Otherwise,  labs are ok Eats lots of bread. , pasta, potatoes.   Dec. 19, 2016: Doing well.   BP has been well controlled.   April 17, 2016:  Doing well from a heart standpoint Having some back pain - may be contributing to his HTN Is  Brought labs from the [place] -  Chol = 126 Trig=98 HDL = 48 LDL = 58   Vit D = 22  Dec. 4, 2017:  Doing well No CP , Not getting much exercise.  Has been limited by his back pain .       Current Outpatient Prescriptions on File Prior to Visit  Medication Sig Dispense Refill  . acetaminophen (TYLENOL) 500 MG tablet Take 500 mg by mouth 2 (two) times daily as needed.    Marland Kitchen albuterol (PROAIR HFA) 108 (90 BASE) MCG/ACT inhaler Inhale 2 puffs into the lungs every 6 (six) hours as needed for wheezing. 3 Inhaler 3  . amLODipine (NORVASC) 5 MG tablet Take 5 mg by mouth daily.    Marland Kitchen aspirin 81 MG tablet Take 81 mg by mouth daily.    . Blood Glucose Monitoring Suppl (TRUERESULT BLOOD GLUCOSE) W/DEVICE KIT     . BREO ELLIPTA 100-25 MCG/INH AEPB USE 2 PUFFS BY MOUTH DAILY  FOR FIFTEEN DAYS 60 each 3  . DULoxetine (CYMBALTA) 30 MG capsule Take 30 mg by mouth at bedtime. TO HELP WITH SLEEP    . famotidine (PEPCID) 20 MG tablet Take 20 mg by mouth 2 (two) times daily.    . fexofenadine (ALLEGRA) 30 MG tablet Take 30 mg by mouth 2 (two) times daily.    . finasteride (PROSCAR) 5 MG tablet Take 5 mg by mouth daily.      . fluticasone (FLONASE) 50 MCG/ACT nasal spray Place 2 sprays into the nose daily.     . fluticasone furoate-vilanterol (BREO ELLIPTA) 100-25 MCG/INH AEPB Inhale 1 puff into the lungs daily. 2 each 0  . HYDROcodone-acetaminophen (NORCO) 5-325 MG tablet 1-2 tabs by mouth every 6 hours as needed pain 20 tablet 0  . losartan (COZAAR) 100 MG tablet Take 1 tablet (100 mg total) by mouth daily. 90 tablet 3  . magnesium oxide (MAG-OX) 400 MG tablet Take 400 mg by mouth daily.    . nitroGLYCERIN (NITROSTAT) 0.4 MG SL tablet Place 1 tablet (0.4 mg total) under the tongue  every 5 (five) minutes as needed (up to three doses, if pain continues call 911). 25 tablet 11  . pantoprazole (PROTONIX) 40 MG tablet TAKE 1 TABLET BY MOUTH DAILY. 90 tablet 0  . potassium chloride SA (K-DUR,KLOR-CON) 20 MEQ tablet TAKE 1 TABLET BY MOUTH TWICE DAILY 180 tablet 3  . rosuvastatin (CRESTOR) 10 MG tablet Take 1 tablet (10 mg total) by mouth daily. 90 tablet 3  . triamterene-hydrochlorothiazide (DYAZIDE) 37.5-25 MG capsule Take 1 each (1 capsule total) by mouth every morning. 90 capsule 3  . TRUEPLUS LANCETS 28G MISC     . TRUETEST TEST test strip     . Vitamin D, Ergocalciferol, (DRISDOL) 50000 units CAPS capsule Take 50,000 Units by mouth every 7 (seven) days.     No current facility-administered medications on file prior to visit.     Allergies  Allergen Reactions  . Doxycycline Monohydrate Hives  . Penicillins Hives  . Fluticasone-Salmeterol     REACTION: sensation of throat closing REACTION: sensation of throat closing  . Lisinopril Cough  . Prednisone     Per ST:MHDQQIW agitation Per LN:LGXQJJH agitation  . Shark Cartilage   . Tetracyclines & Related Hives  . Tetracycline Rash    Past Medical History:  Diagnosis Date  . Asthma   . BPH (benign prostatic hypertrophy)   . Diabetes ([place])   . History of orthostatic hypotension   . Hyperlipidemia   . Hypertension   . MI (myocardial infarction)   . Prostate cancer ([place])   . Rheumatic fever   . Sleep apnea     Past Surgical History:  Procedure Laterality Date  . APPENDECTOMY  1954  . HERNIA REPAIR    . INCISION AND DRAINAGE ABSCESS Right 04/02/2016   Procedure: INCISION AND DRAINAGE ABSCESS;  Surgeon: Leanora Cover, MD;  Location: [facility];  Service: Orthopedics;  Laterality: Right;  Marland Kitchen VASECTOMY      History  Smoking Status  . Former Smoker  . Packs/day: 1.00  . Years: 30.00  . Types: Cigarettes, Pipe  . Quit date: 10/19/2012  Smokeless Tobacco  . Never Used    Comment: 1 pipe every 3  days, quit smoking cig 12 yrs ago 07/06/13    History  Alcohol Use No    Comment: no etoh in 12 years    Family History  Problem Relation Age of Onset  .  Heart failure Mother   . Arthritis Other   . Diabetes Other   . Heart disease Other   . Hyperlipidemia Other   . Kidney disease Other   . Obesity Other     Reviw of Systems:  Reviewed in the HPI.  All other systems are negative.  Physical Exam: Blood pressure 132/74, pulse 73, height 5' 10.5" (1.791 m), weight 202 lb (91.6 kg). General: Well developed, well nourished, in no acute distress.  Head: Normocephalic, atraumatic, sclera non-icteric, mucus membranes are moist,  Neck: Supple. Carotids are 2 + without bruits. No JVD Lungs: Clear bilaterally to auscultation. Heart: regular rate.  normal  S1 S2. No murmurs, gallops or rubs. Abdomen: Soft, non-tender, non-distended with normal bowel sounds. No hepatomegaly. No rebound/guarding. No masses. Msk:  Strength and tone are normal Extremities: No clubbing or cyanosis. No edema.  Distal pedal pulses are 2+ and equal bilaterally. Neuro: Alert and oriented X 3. Moves all extremities spontaneously. Psych:  Responds to questions appropriately with a normal affect.  ECG: 09/21/2016: Normal sinus rhythm at 73. He has a first-degree AV block. There is a right bundle branch block.  Assessment / Plan:   1. Hypertension -   his blood pressure remains fairly well-controlled at home. Continue current medications.   2. Orthostatic hypotension 3. Hypercholesterolemia -  Recent labs are good.   He has these followed at the [place]  He is on Rosuvastatin   4. Prostate cancer 6. Diabetes Mellitus 7. RBBB 8.  Depression :   Is now on Cymbalta.     Mertie Moores, MD  09/21/2016 8:32 AM    [address],  [place], [place]  [address] Pager [phone] Phone: [phone]; Fax: [phone]

## 2016-09-21 NOTE — Patient Instructions (Signed)
Medication Instructions:  Your physician recommends that you continue on your current medications as directed. Please refer to the Current Medication list given to you today.   Labwork: None Ordered   Testing/Procedures: None Ordered   Follow-Up: Your physician wants you to follow-up in: 6 months with Dr. Nahser.  You will receive a reminder letter in the mail two months in advance. If you don't receive a letter, please call our office to schedule the follow-up appointment.   If you need a refill on your cardiac medications before your next appointment, please call your pharmacy.   Thank you for choosing [facility]! Eryka Dolinger, RN [phone]    

## 2016-10-08 DIAGNOSIS — N471 Phimosis: Secondary | ICD-10-CM

## 2016-10-08 HISTORY — DX: Phimosis: N47.1

## 2016-10-17 ENCOUNTER — Other Ambulatory Visit: Payer: Self-pay | Admitting: Cardiovascular Disease

## 2016-10-20 ENCOUNTER — Other Ambulatory Visit: Payer: Self-pay

## 2016-10-20 MED ORDER — ROSUVASTATIN CALCIUM 10 MG PO TABS: 10.0000 mg | ORAL_TABLET | Freq: Every day | ORAL | 3 refills | Status: DC

## 2016-10-22 ENCOUNTER — Ambulatory Visit: Payer: Medicare Other | Admitting: Cardiovascular Disease

## 2017-03-25 HISTORY — PX: CATARACT EXTRACTION: SUR2

## 2017-03-26 ENCOUNTER — Encounter (HOSPITAL_COMMUNITY): Payer: Self-pay | Admitting: Emergency Medicine

## 2017-03-26 ENCOUNTER — Emergency Department (HOSPITAL_COMMUNITY)
Admission: EM | Admit: 2017-03-26 | Discharge: 2017-03-26 | Disposition: A | Discharge status: Home / Self Care | Payer: Medicare Other | Attending: Emergency Medicine | Admitting: Emergency Medicine

## 2017-03-26 DIAGNOSIS — Z87891 Personal history of nicotine dependence: Secondary | ICD-10-CM | POA: Insufficient documentation

## 2017-03-26 DIAGNOSIS — I252 Old myocardial infarction: Secondary | ICD-10-CM | POA: Insufficient documentation

## 2017-03-26 DIAGNOSIS — I10 Essential (primary) hypertension: Secondary | ICD-10-CM | POA: Diagnosis not present

## 2017-03-26 DIAGNOSIS — Z7984 Long term (current) use of oral hypoglycemic drugs: Secondary | ICD-10-CM | POA: Insufficient documentation

## 2017-03-26 DIAGNOSIS — R55 Syncope and collapse: Secondary | ICD-10-CM | POA: Diagnosis not present

## 2017-03-26 DIAGNOSIS — Z79899 Other long term (current) drug therapy: Secondary | ICD-10-CM | POA: Insufficient documentation

## 2017-03-26 DIAGNOSIS — Z7982 Long term (current) use of aspirin: Secondary | ICD-10-CM | POA: Diagnosis not present

## 2017-03-26 DIAGNOSIS — E119 Type 2 diabetes mellitus without complications: Secondary | ICD-10-CM | POA: Diagnosis not present

## 2017-03-26 DIAGNOSIS — J45909 Unspecified asthma, uncomplicated: Secondary | ICD-10-CM | POA: Diagnosis not present

## 2017-03-26 DIAGNOSIS — R42 Dizziness and giddiness: Secondary | ICD-10-CM | POA: Diagnosis present

## 2017-03-26 DIAGNOSIS — R531 Weakness: Secondary | ICD-10-CM | POA: Diagnosis not present

## 2017-03-26 LAB — URINALYSIS, ROUTINE W REFLEX MICROSCOPIC
Bilirubin Urine: NEGATIVE
Glucose, UA: NEGATIVE mg/dL
Hgb urine dipstick: NEGATIVE
Ketones, ur: NEGATIVE mg/dL
Leukocytes, UA: NEGATIVE
Nitrite: NEGATIVE
Protein, ur: NEGATIVE mg/dL
Specific Gravity, Urine: 1.006 (ref 1.005–1.030)
pH: 8 (ref 5.0–8.0)

## 2017-03-26 LAB — CBC
HCT: 37.4 % — ABNORMAL LOW (ref 39.0–52.0)
Hemoglobin: 12.4 g/dL — ABNORMAL LOW (ref 13.0–17.0)
MCH: 28.5 pg (ref 26.0–34.0)
MCHC: 33.2 g/dL (ref 30.0–36.0)
MCV: 86 fL (ref 78.0–100.0)
Platelets: 221 10*3/uL (ref 150–400)
RBC: 4.35 MIL/uL (ref 4.22–5.81)
RDW: 13.9 % (ref 11.5–15.5)
WBC: 7.3 10*3/uL (ref 4.0–10.5)

## 2017-03-26 LAB — BASIC METABOLIC PANEL
Anion gap: 9 (ref 5–15)
BUN: 12 mg/dL (ref 6–20)
CO2: 25 mmol/L (ref 22–32)
Calcium: 9.2 mg/dL (ref 8.9–10.3)
Chloride: 103 mmol/L (ref 101–111)
Creatinine, Ser: 0.78 mg/dL (ref 0.61–1.24)
GFR calc Af Amer: >60 mL/min (ref >60)
GFR calc non Af Amer: >60 mL/min (ref >60)
Glucose, Bld: 106 mg/dL — ABNORMAL HIGH (ref 65–99)
Potassium: 3.6 mmol/L (ref 3.5–5.1)
Sodium: 137 mmol/L (ref 135–145)

## 2017-03-26 LAB — I-STAT TROPONIN, ED
Troponin i, poc: 0 ng/mL (ref 0.00–0.08)
Troponin i, poc: 0.01 ng/mL (ref 0.00–0.08)

## 2017-03-26 LAB — CBG MONITORING, ED: Glucose-Capillary: 97 mg/dL (ref 65–99)

## 2017-03-26 NOTE — ED Notes (Signed)
Urinal given to patient.

## 2017-03-26 NOTE — ED Notes (Signed)
Ordered carb modified tray for patient.

## 2017-03-26 NOTE — ED Triage Notes (Signed)
Patient brought in by [facility] for sudden onset weakness, dizziness, shortness of breath, and diaphoresis while standing in place.  Patient denies falling or LOC.  Patient states he feels normal now.  EMS gave 400cc NS, 15g oral glucose.  History of diabetes, CBG before oral glucose in 80's.  CBG after oral glucose 101.  20g saline lock in left wrist.  Patient alert and oriented at this time.

## 2017-03-26 NOTE — ED Notes (Signed)
Patient ambulates to bathroom without any assistance, denies any lightheadedness or dizziness

## 2017-03-26 NOTE — ED Provider Notes (Addendum)
[facility] DEPT Provider Note   CSN: [id number] Arrival date & time: 03/26/17  1126     History   Chief Complaint Chief Complaint  Patient presents with  . Dizziness    HPI [name] [name] is a 82 y.o. male.  HPI Patient reports he went to ophthalmology follow-up appointment today with no problems or symptoms. He did have an issue with the blood glucose monitoring machine this morning. It did not work. He normally takes an evening dose of metformin. He did eat 2 pieces of toast this morning but had not had anything else. After his eye appointment he went to [employer] to pick up a new glucose machine. [place] very hot outside he got somewhat overheated walking in and out of the building. He also reports and he had to stand quite a while waiting for the client in front of him to get her order filled. He reports at that time he started to get dizzy, nauseated and sweaty. He denies any associated chest pain or shortness of breath. They had him lie down. EMS came and determined his blood sugar to be 88. He was given oral glucose and he reports within few minutes he felt much better and symptoms resolve. He reports his typical blood sugars are running in the 120s to 130s. He reports he has had a heart attack in the past but he did not think the symptoms are similar. He reports he felt as if his blood sugar had dropped. Past Medical History:  Diagnosis Date  . Asthma   . BPH (benign prostatic hypertrophy)   . Diabetes ([place])   . History of orthostatic hypotension   . Hyperlipidemia   . Hypertension   . MI (myocardial infarction) ([place])   . Prostate cancer ([place])   . Rheumatic fever   . Sleep apnea     Patient Active Problem List   Diagnosis Date Noted  . Asthma without status asthmaticus 09/21/2016  . Asthma attack 09/21/2016  . Diabetes mellitus, type 2 ([place]) 09/21/2016  . Acid reflux 09/21/2016  . Hypercholesterolemia without hypertriglyceridemia 09/21/2016  . Personal history of  prostate cancer 06/18/2016  . Felon of finger of right hand 04/06/2016  . Paronychia of left middle finger 04/06/2016  . OSA (obstructive sleep apnea) 05/10/2015  . Bladder outlet obstruction 03/22/2013  . Right bundle branch block 03/17/2013  . Incomplete emptying of bladder 09/22/2012  . ED (erectile dysfunction) of organic origin 09/21/2012  . Leg weakness 04/13/2011  . Musculoskeletal symptoms referable to limbs 04/13/2011  . CA of prostate ([place]) 01/02/2009  . Cough 11/20/2008  . Hyperlipidemia 11/15/2008  . Benign essential HTN 11/15/2008  . Acute myocardial infarction ([place]) 11/15/2008  . ALLERGIC RHINITIS 11/15/2008  . Chronic airway obstruction ([place]) 11/14/2008    Past Surgical History:  Procedure Laterality Date  . APPENDECTOMY  1954  . CATARACT EXTRACTION Left 03/25/2017  . HERNIA REPAIR    . INCISION AND DRAINAGE ABSCESS Right 04/02/2016   Procedure: INCISION AND DRAINAGE ABSCESS;  Surgeon: Leanora Cover, MD;  Location: [facility];  Service: Orthopedics;  Laterality: Right;  Marland Kitchen VASECTOMY         Home Medications    Prior to Admission medications   Medication Sig Start Date End Date Taking? Authorizing Provider  acetaminophen (TYLENOL) 500 MG tablet Take 500 mg by mouth 2 (two) times daily as needed.   Yes [provider]  albuterol (PROAIR HFA) 108 (90 BASE) MCG/ACT inhaler Inhale 2 puffs into  the lungs every 6 (six) hours as needed for wheezing. 07/06/13  Yes Collene Gobble, MD  amLODipine (NORVASC) 10 MG tablet Take 10 mg by mouth daily.   Yes [provider]  aspirin 81 MG tablet Take 81 mg by mouth daily.   Yes [provider]  Blood Glucose Monitoring Suppl (TRUERESULT BLOOD GLUCOSE) W/DEVICE KIT  08/04/13  Yes [provider]  BREO ELLIPTA 100-25 MCG/INH AEPB USE 2 PUFFS BY MOUTH DAILY FOR FIFTEEN DAYS 11/15/15  Yes Byrum, Rose Fillers, MD  Carboxymethylcellulose Sodium (LUBRICANT EYE DROPS OP) Place 1 drop into both  eyes daily.   Yes [provider]  cholecalciferol (VITAMIN D) 1000 units tablet Take 1,000 Units by mouth daily.   Yes [provider]  famotidine (PEPCID) 20 MG tablet Take 20 mg by mouth 2 (two) times daily.   Yes [provider]  fexofenadine (ALLEGRA) 30 MG tablet Take 30 mg by mouth 2 (two) times daily.   Yes [provider]  finasteride (PROSCAR) 5 MG tablet Take 5 mg by mouth daily.     Yes [provider]  fluticasone (FLONASE) 50 MCG/ACT nasal spray Place 2 sprays into the nose daily.  02/22/13  Yes [provider]  latanoprost (XALATAN) 0.005 % ophthalmic solution Place 1 drop into both eyes daily. 08/07/16  Yes [provider]  losartan (COZAAR) 100 MG tablet Take 1 tablet (100 mg total) by mouth daily. 08/06/16  Yes Nahser, Wonda Cheng, MD  magnesium oxide (MAG-OX) 400 MG tablet Take 400 mg by mouth daily.   Yes [provider]  metFORMIN (GLUCOPHAGE) 500 MG tablet Take 1,000 mg by mouth 2 (two) times daily with a meal.   Yes [provider]  Multiple Vitamins-Minerals (PRESERVISION AREDS PO) Take by mouth daily.   Yes [provider]  pantoprazole (PROTONIX) 40 MG tablet TAKE 1 TABLET BY MOUTH DAILY. 01/13/16  Yes Collene Gobble, MD  potassium chloride SA (K-DUR,KLOR-CON) 20 MEQ tablet TAKE 1 TABLET BY MOUTH TWICE DAILY 06/25/15  Yes Nahser, Wonda Cheng, MD  rosuvastatin (CRESTOR) 10 MG tablet Take 1 tablet (10 mg total) by mouth daily. 10/20/16  Yes Nahser, Wonda Cheng, MD  triamterene-hydrochlorothiazide (DYAZIDE) 37.5-25 MG capsule Take 1 each (1 capsule total) by mouth every morning. 07/28/16  Yes Nahser, Wonda Cheng, MD  TRUEPLUS LANCETS 28G MISC  08/04/13  Yes [provider]  TRUETEST TEST test strip  08/04/13  Yes [provider]  amLODipine (NORVASC) 5 MG tablet Take 5 mg by mouth daily.    Leeroy Cha, MD  DULoxetine (CYMBALTA) 30 MG capsule Take 30 mg by mouth at bedtime. TO  HELP WITH SLEEP 09/19/15   [provider]  fluticasone furoate-vilanterol (BREO ELLIPTA) 100-25 MCG/INH AEPB Inhale 1 puff into the lungs daily. Patient not taking: Reported on 03/26/2017 07/31/16   Collene Gobble, MD  HYDROcodone-acetaminophen [facility]) 5-325 MG tablet 1-2 tabs by mouth every 6 hours as needed pain Patient not taking: Reported on 03/26/2017 04/17/16   Nahser, Wonda Cheng, MD  nitroGLYCERIN (NITROSTAT) 0.4 MG SL tablet Place 1 tablet (0.4 mg total) under the tongue every 5 (five) minutes as needed (up to three doses, if pain continues call 911). 06/27/15   Nahser, Wonda Cheng, MD  rosuvastatin (CRESTOR) 10 MG tablet TAKE 1 TABLET BY MOUTH DAILY. Patient not taking: Reported on 03/26/2017 10/21/16   Nahser, Wonda Cheng, MD  Vitamin D, Ergocalciferol, (DRISDOL) 50000 units CAPS capsule Take 50,000 Units by  mouth every 7 (seven) days.    [provider]    Family History Family History  Problem Relation Age of Onset  . Heart failure Mother   . Arthritis Other   . Diabetes Other   . Heart disease Other   . Hyperlipidemia Other   . Kidney disease Other   . Obesity Other     Social History Social History  Substance Use Topics  . Smoking status: Former Smoker    Packs/day: 1.00    Years: 30.00    Types: Cigarettes, Pipe    Quit date: 10/19/2012  . Smokeless tobacco: Never Used     Comment: 1 pipe every 3 days, quit smoking cig 12 yrs ago 07/06/13  . Alcohol use No     Comment: no etoh in 12 years     Allergies   Doxycycline monohydrate; Penicillins; Fluticasone-salmeterol; Lisinopril; Prednisone; Shark cartilage; Tetracyclines & related; and Tetracycline   Review of Systems Review of Systems   Physical Exam Updated Vital Signs BP 137/81   Pulse 97   Temp 98.4 F (36.9 C) (Oral)   Resp (!) 34   SpO2 96%   Physical Exam   ED Treatments / Results  Labs (all labs ordered are listed, but only abnormal results are displayed) Labs Reviewed  BASIC METABOLIC  PANEL - Abnormal; Notable for the following:       Result Value   Glucose, Bld 106 (*)    All other components within normal limits  CBC - Abnormal; Notable for the following:    Hemoglobin 12.4 (*)    HCT 37.4 (*)    All other components within normal limits  URINALYSIS, ROUTINE W REFLEX MICROSCOPIC - Abnormal; Notable for the following:    Color, Urine STRAW (*)    All other components within normal limits  CBG MONITORING, ED  I-STAT TROPOININ, ED  I-STAT TROPOININ, ED    EKG  EKG Interpretation  Date/Time:  Friday March 26 2017 11:38:23 EDT Ventricular Rate:  80 PR Interval:    QRS Duration: 148 QT Interval:  435 QTC Calculation: 502 R Axis:   -70 Text Interpretation:  Sinus or ectopic atrial rhythm RBBB and LAFB Probable lateral infarct, old no sig change from previos Confirmed by Charlesetta Shanks [phone]) on 03/26/2017 12:28:27 PM       Radiology No results found.  Procedures Procedures (including critical care time)  Medications Ordered in ED Medications - No data to display   Initial Impression / Assessment and Plan / ED Course  I have reviewed the triage vital signs and the nursing notes.  Pertinent labs & imaging results that were available during my care of the patient were reviewed by me and considered in my medical decision making (see chart for details).     Final Clinical Impressions(s) / ED Diagnoses   Final diagnoses:  General weakness  Near syncope  Patient's symptoms likely secondary to blood sugar lower than his baseline and he did exposure with prolonged standing. Patient did not have complete loss of consciousness. Symptoms were not sudden in onset. He had no associated chest pain. At this time, 2 sets of troponin are negative. Patient has been asymptomatic since having oral glucose by EMS. Return precautions are reviewed.  New Prescriptions New Prescriptions   No medications on file     Charlesetta Shanks, MD 03/26/17 1622    Charlesetta Shanks, MD 05/21/17 [phone]

## 2017-04-05 ENCOUNTER — Ambulatory Visit (INDEPENDENT_AMBULATORY_CARE_PROVIDER_SITE_OTHER): Payer: Medicare Other | Admitting: Cardiovascular Disease

## 2017-04-05 ENCOUNTER — Encounter: Payer: Self-pay | Admitting: Cardiovascular Disease

## 2017-04-05 VITALS — BP 132/84 | HR 68 | Ht 70.0 in | Wt 203.0 lb

## 2017-04-05 DIAGNOSIS — I10 Essential (primary) hypertension: Secondary | ICD-10-CM

## 2017-04-05 MED ORDER — LOSARTAN POTASSIUM 100 MG PO TABS: 100.0000 mg | ORAL_TABLET | Freq: Every day | ORAL | 3 refills | Status: DC

## 2017-04-05 MED ORDER — TRIAMTERENE-HCTZ 37.5-25 MG PO CAPS: 1.0000 | ORAL_CAPSULE | Freq: Every morning | ORAL | 3 refills | Status: DC

## 2017-04-05 NOTE — Progress Notes (Signed)
[name] [name] Date of Birth  1934/12/15       [facility]    [employer] [address], [address], [place], [place]  [address]   [place], [place]  [address] [phone]     [phone]   Fax  [phone]    Fax [phone]  Problem List: 1. Hypertension 2. Orthostatic hypotension 3. Hypercholesterolemia 4. Prostate cancer 5. Dyslipidemia 6. Diabetes Mellitus 7. RBBB  [name] [name] is a 82 year old gentleman with a history of hypertension. Also has intermittent episodes of orthostatic hypotension. He also has a history of hypercholesterolemia and history of prostate cancer.  [name] [name] has had problems with fatigue.  He is limited by his arthritis and back pain.  He denies any chest pain or dyspnea.  He wakes up gasping for air frequently.  His symptoms sound like sleep apnea. He has lots of postnasal drip and also has asthma. He did not take his blood pressure medicines today because he's fasting. This may explain his mild blood pressure elevation.  Mar 17, 2013:  [name] [name] is doing OK from a cardiac standpoint.  He is having some asthma problems.  No CP.   He quit smoking his pipe.  His BP is typically well controlled - it is a bit high today b/c he has not taken his meds yet this am.  Dec. 1, 2014:  [name] [name] was diagnosed with diabetes mellitus recently.  He has been recording his BP.    March 28, 2014: [name] [name] is doing ok.  He was on vacation last week and ate lots of foods with salt.  That may explain his HTN today.  No CP  Dec. 7, 2015:  [name] [name] is a 82 yo who we follow for HTN, hyperlipidemia. He states that he is not doing well.  Having trouble with controlling his diabetes. Has lost 17 lbs - wants to lost 15 more. Eating low carb bread.  Has lots of back pain - has been going to Dr. Jeanie Cooks - pain doctor.  X-rays have found lots of arthritis in his back.   April 09, 2015:  [name] [name] is doing ok. Not exercising much.  Trigs are a bit elevated.   Otherwise,  labs are ok Eats lots of bread. , pasta, potatoes.   Dec. 19, 2016: Doing well.   BP has been well controlled.   April 17, 2016:  Doing well from a heart standpoint Having some back pain - may be contributing to his HTN Is  Brought labs from the [place] -  Chol = 126 Trig=98 HDL = 48 LDL = 58   Vit D = 22  Dec. 4, 2017:  Doing well No CP , Not getting much exercise.  Has been limited by his back pain .     April 05, 2017:  Doing well Having some eye problems  No cardiac issues.   2 weeks ago, had an episode of near syncope BP was low - 92/60  Had no skipped lunch,  Glucose was 71.    Went to ER ,  Resolved   Has lost weight.    Current Outpatient Prescriptions on File Prior to Visit  Medication Sig Dispense Refill  . acetaminophen (TYLENOL) 500 MG tablet Take 500 mg by mouth 2 (two) times daily as needed.    Marland Kitchen albuterol (PROAIR HFA) 108 (90 BASE) MCG/ACT inhaler Inhale 2 puffs into the lungs every 6 (six) hours as needed for wheezing. 3 Inhaler 3  .  amLODipine (NORVASC) 5 MG tablet Take 5 mg by mouth daily.    Marland Kitchen aspirin 81 MG tablet Take 81 mg by mouth daily.    . Blood Glucose Monitoring Suppl (TRUERESULT BLOOD GLUCOSE) W/DEVICE KIT     . cholecalciferol (VITAMIN D) 1000 units tablet Take 1,000 Units by mouth daily.    . famotidine (PEPCID) 20 MG tablet Take 20 mg by mouth 2 (two) times daily.    . finasteride (PROSCAR) 5 MG tablet Take 5 mg by mouth daily.      . fluticasone (FLONASE) 50 MCG/ACT nasal spray Place 2 sprays into the nose daily.     Marland Kitchen latanoprost (XALATAN) 0.005 % ophthalmic solution Place 1 drop into both eyes daily.    Marland Kitchen losartan (COZAAR) 100 MG tablet Take 1 tablet (100 mg total) by mouth daily. 90 tablet 3  . magnesium oxide (MAG-OX) 400 MG tablet Take 400 mg by mouth daily.    . metFORMIN (GLUCOPHAGE) 500 MG tablet Take 1,000 mg by mouth 2 (two) times daily with a meal.    . nitroGLYCERIN (NITROSTAT) 0.4 MG SL tablet Place 1 tablet (0.4 mg total)  under the tongue every 5 (five) minutes as needed (up to three doses, if pain continues call 911). 25 tablet 11  . pantoprazole (PROTONIX) 40 MG tablet TAKE 1 TABLET BY MOUTH DAILY. 90 tablet 0  . potassium chloride SA (K-DUR,KLOR-CON) 20 MEQ tablet TAKE 1 TABLET BY MOUTH TWICE DAILY 180 tablet 3  . rosuvastatin (CRESTOR) 10 MG tablet Take 1 tablet (10 mg total) by mouth daily. 90 tablet 3  . triamterene-hydrochlorothiazide (DYAZIDE) 37.5-25 MG capsule Take 1 each (1 capsule total) by mouth every morning. 90 capsule 3  . TRUEPLUS LANCETS 28G MISC     . TRUETEST TEST test strip     . Vitamin D, Ergocalciferol, (DRISDOL) 50000 units CAPS capsule Take 50,000 Units by mouth every 7 (seven) days.     No current facility-administered medications on file prior to visit.     Allergies  Allergen Reactions  . Doxycycline Monohydrate Hives  . Penicillins Hives  . Fluticasone-Salmeterol     REACTION: sensation of throat closing REACTION: sensation of throat closing  . Lisinopril Cough  . Prednisone     Per YQ:MVHQION agitation Per GE:XBMWUXL agitation  . Shark Cartilage   . Tetracyclines & Related Hives  . Tetracycline Rash    Past Medical History:  Diagnosis Date  . Asthma   . BPH (benign prostatic hypertrophy)   . Diabetes ([place])   . History of orthostatic hypotension   . Hyperlipidemia   . Hypertension   . MI (myocardial infarction) ([facility])   . Prostate cancer ([place])   . Rheumatic fever   . Sleep apnea     Past Surgical History:  Procedure Laterality Date  . APPENDECTOMY  1954  . CATARACT EXTRACTION Left 03/25/2017  . HERNIA REPAIR    . INCISION AND DRAINAGE ABSCESS Right 04/02/2016   Procedure: INCISION AND DRAINAGE ABSCESS;  Surgeon: Leanora Cover, MD;  Location: [facility];  Service: Orthopedics;  Laterality: Right;  Marland Kitchen VASECTOMY      History  Smoking Status  . Former Smoker  . Packs/day: 1.00  . Years: 30.00  . Types: Cigarettes, Pipe  . Quit date: 10/19/2012   Smokeless Tobacco  . Never Used    Comment: 1 pipe every 3 days, quit smoking cig 12 yrs ago 07/06/13    History  Alcohol Use No  Comment: no etoh in 12 years    Family History  Problem Relation Age of Onset  . Heart failure Mother   . Arthritis Other   . Diabetes Other   . Heart disease Other   . Hyperlipidemia Other   . Kidney disease Other   . Obesity Other     Reviw of Systems:  Reviewed in the HPI.  All other systems are negative.  Physical Exam: Blood pressure 132/84, pulse 68, height '5\' 10"'  (1.778 m), weight 203 lb (92.1 kg). General: Well developed, well nourished, in no acute distress. Head: Normocephalic, atraumatic, sclera non-icteric, mucus membranes are moist,  Neck: Supple. Carotids are 2 + without bruits. No JVD Lungs: Clear bilaterally to auscultation. Heart: regular rate.  normal  S1 S2. No murmurs, gallops or rubs. Abdomen: Soft, non-tender, non-distended with normal bowel sounds. No hepatomegaly. No rebound/guarding. No masses. Msk:  Strength and tone are normal Extremities: No clubbing or cyanosis. No edema.  Distal pedal pulses are 2+ and equal bilaterally. Neuro: Alert and oriented X 3. Moves all extremities spontaneously. Psych:  Responds to questions appropriately with a normal affect.  ECG:  Assessment / Plan:   1. Hypertension -   his blood pressure remains fairly well-controlled at home. Continue current medications. He is taking old Maxzide tablets.  Will refill these    2. Orthostatic hypotension  3. Hypercholesterolemia -  Recent labs are good.   He has these followed at the [place]  He is on Rosuvastatin   4. Prostate cancer 6. Diabetes Mellitus -  I've advised him to call his primary MD and have his diabetic meds   7. RBBB 8.  Depression :   Is now on Cymbalta.     Mertie Moores, MD  04/05/2017 2:21 PM    [address],  [place], [place]  [address] Pager [phone] Phone: [phone]; Fax: [phone]

## 2017-04-05 NOTE — Patient Instructions (Signed)

## 2017-05-20 NOTE — ED Provider Notes (Signed)
[facility] DEPT Provider Note   CSN: [id number] Arrival date & time: 03/26/17  1126     History   Chief Complaint Chief Complaint  Patient presents with  . Dizziness    HPI [name] [name] is a 82 y.o. male.  HPI Patient reports he went to ophthalmology follow-up appointment today with no problems or symptoms. He did have an issue with the blood glucose monitoring machine this morning. It did not work. He normally takes an evening dose of metformin. He did eat 2 pieces of toast this morning but had not had anything else. After his eye appointment he went to [employer] to pick up a new glucose machine. [place] is very hot outside he got somewhat overheated walking in and out of the building. He also reports and he had to stand quite a while waiting for the client in front of him to get her order filled. He reports at that time he started to get dizzy, nauseated and sweaty. He denies any associated chest pain or shortness of breath. They had him lie down. EMS came and determined his blood sugar to be 88. He was given oral glucose and he reports within few minutes he felt much better and symptoms resolve. He reports his typical blood sugars are running in the 120s to 130s. He reports he has had a heart attack in the past but he did not think the symptoms are similar. He reports he felt as if his blood sugar had dropped. Past Medical History:  Diagnosis Date  . Asthma   . BPH (benign prostatic hypertrophy)   . Diabetes ([place])   . History of orthostatic hypotension   . Hyperlipidemia   . Hypertension   . MI (myocardial infarction) ([place])   . Prostate cancer ([place])   . Rheumatic fever   . Sleep apnea     Patient Active Problem List   Diagnosis Date Noted  . Asthma without status asthmaticus 09/21/2016  . Asthma attack 09/21/2016  . Diabetes mellitus, type 2 ([place]) 09/21/2016  . Acid reflux 09/21/2016  . Hypercholesterolemia without hypertriglyceridemia 09/21/2016  . Personal history of  prostate cancer 06/18/2016  . Felon of finger of right hand 04/06/2016  . Paronychia of left middle finger 04/06/2016  . OSA (obstructive sleep apnea) 05/10/2015  . Bladder outlet obstruction 03/22/2013  . Right bundle branch block 03/17/2013  . Incomplete emptying of bladder 09/22/2012  . ED (erectile dysfunction) of organic origin 09/21/2012  . Leg weakness 04/13/2011  . Musculoskeletal symptoms referable to limbs 04/13/2011  . CA of prostate ([place]) 01/02/2009  . Cough 11/20/2008  . Hyperlipidemia 11/15/2008  . Benign essential HTN 11/15/2008  . Acute myocardial infarction ([facility]) 11/15/2008  . ALLERGIC RHINITIS 11/15/2008  . Chronic airway obstruction ([place]) 11/14/2008    Past Surgical History:  Procedure Laterality Date  . APPENDECTOMY  1954  . CATARACT EXTRACTION Left 03/25/2017  . HERNIA REPAIR    . INCISION AND DRAINAGE ABSCESS Right 04/02/2016   Procedure: INCISION AND DRAINAGE ABSCESS;  Surgeon: Leanora Cover, MD;  Location: [facility];  Service: Orthopedics;  Laterality: Right;  Marland Kitchen VASECTOMY         Home Medications    Prior to Admission medications   Medication Sig Start Date End Date Taking? Authorizing Provider  acetaminophen (TYLENOL) 500 MG tablet Take 500 mg by mouth 2 (two) times daily as needed.   Yes [provider]  albuterol (PROAIR HFA) 108 (90 BASE) MCG/ACT inhaler Inhale 2 puffs into  the lungs every 6 (six) hours as needed for wheezing. 07/06/13  Yes Collene Gobble, MD  aspirin 81 MG tablet Take 81 mg by mouth daily.   Yes [provider]  Blood Glucose Monitoring Suppl (TRUERESULT BLOOD GLUCOSE) W/DEVICE KIT  08/04/13  Yes [provider]  cholecalciferol (VITAMIN D) 1000 units tablet Take 1,000 Units by mouth daily.   Yes [provider]  famotidine (PEPCID) 20 MG tablet Take 20 mg by mouth 2 (two) times daily.   Yes [provider]  finasteride (PROSCAR) 5 MG tablet Take 5 mg by mouth daily.      Yes [provider]  fluticasone (FLONASE) 50 MCG/ACT nasal spray Place 2 sprays into the nose daily.  02/22/13  Yes [provider]  latanoprost (XALATAN) 0.005 % ophthalmic solution Place 1 drop into both eyes daily. 08/07/16  Yes [provider]  magnesium oxide (MAG-OX) 400 MG tablet Take 400 mg by mouth daily.   Yes [provider]  metFORMIN (GLUCOPHAGE) 500 MG tablet Take 1,000 mg by mouth 2 (two) times daily with a meal.   Yes [provider]  pantoprazole (PROTONIX) 40 MG tablet TAKE 1 TABLET BY MOUTH DAILY. 01/13/16  Yes Collene Gobble, MD  potassium chloride SA (K-DUR,KLOR-CON) 20 MEQ tablet TAKE 1 TABLET BY MOUTH TWICE DAILY 06/25/15  Yes Nahser, Wonda Cheng, MD  rosuvastatin (CRESTOR) 10 MG tablet Take 1 tablet (10 mg total) by mouth daily. 10/20/16  Yes Nahser, Wonda Cheng, MD  TRUEPLUS LANCETS 28G [place]  08/04/13  Yes [provider]  TRUETEST TEST test strip  08/04/13  Yes [provider]  amLODipine (NORVASC) 5 MG tablet Take 5 mg by mouth daily.    Leeroy Cha, MD  benzonatate (TESSALON) 200 MG capsule Take 200 mg by mouth 3 (three) times daily as needed for cough.    [provider]  cetirizine (ZYRTEC) 10 MG tablet Take 10 mg by mouth daily.    [provider]  losartan (COZAAR) 100 MG tablet Take 1 tablet (100 mg total) by mouth daily. 04/05/17   Nahser, Wonda Cheng, MD  nitroGLYCERIN (NITROSTAT) 0.4 MG SL tablet Place 1 tablet (0.4 mg total) under the tongue every 5 (five) minutes as needed (up to three doses, if pain continues call 911). 06/27/15   Nahser, Wonda Cheng, MD  triamterene-hydrochlorothiazide (DYAZIDE) 37.5-25 MG capsule Take 1 each (1 capsule total) by mouth every morning. 04/05/17   Nahser, Wonda Cheng, MD  Vitamin D, Ergocalciferol, (DRISDOL) 50000 units CAPS capsule Take 50,000 Units by mouth every 7 (seven) days.    [provider]    Family History Family History  Problem Relation  Age of Onset  . Heart failure Mother   . Arthritis Other   . Diabetes Other   . Heart disease Other   . Hyperlipidemia Other   . Kidney disease Other   . Obesity Other     Social History Social History  Substance Use Topics  . Smoking status: Former Smoker    Packs/day: 1.00    Years: 30.00    Types: Cigarettes, Pipe    Quit date: 10/19/2012  . Smokeless tobacco: Never Used     Comment: 1 pipe every 3 days, quit smoking cig 12 yrs ago 07/06/13  . Alcohol use No     Comment: no etoh in 12 years     Allergies   Doxycycline monohydrate; Penicillins; Fluticasone-salmeterol; Lisinopril; Prednisone; Shark cartilage; Tetracyclines & related; and Tetracycline  Review of Systems Review of Systems 10 Systems reviewed and are negative for acute change except as noted in the HPI.  Physical Exam Updated Vital Signs BP 137/81   Pulse 97   Temp 98.4 F (36.9 C) (Oral)   Resp (!) 34   SpO2 96%   Physical Exam  Constitutional: He is oriented to person, place, and time. He appears well-developed and well-nourished.  HENT:  Head: Normocephalic and atraumatic.  Eyes: Conjunctivae and EOM are normal.  Neck: Neck supple.  Cardiovascular: Normal rate and regular rhythm.   No murmur heard. Pulmonary/Chest: Effort normal and breath sounds normal. No respiratory distress.  Abdominal: Soft. There is no tenderness.  Musculoskeletal: He exhibits no edema.  Neurological: He is alert and oriented to person, place, and time. No cranial nerve deficit. He exhibits normal muscle tone. Coordination normal.  Skin: Skin is warm and dry.  Psychiatric: He has a normal mood and affect.  Nursing note and vitals reviewed.    ED Treatments / Results  Labs (all labs ordered are listed, but only abnormal results are displayed) Labs Reviewed  BASIC METABOLIC PANEL - Abnormal; Notable for the following:       Result Value   Glucose, Bld 106 (*)    All other components within normal limits  CBC -  Abnormal; Notable for the following:    Hemoglobin 12.4 (*)    HCT 37.4 (*)    All other components within normal limits  URINALYSIS, ROUTINE W REFLEX MICROSCOPIC - Abnormal; Notable for the following:    Color, Urine STRAW (*)    All other components within normal limits  CBG MONITORING, ED  I-STAT TROPOININ, ED  I-STAT TROPOININ, ED    EKG  EKG Interpretation  Date/Time:  Friday March 26 2017 11:38:23 EDT Ventricular Rate:  80 PR Interval:    QRS Duration: 148 QT Interval:  435 QTC Calculation: 502 R Axis:   -70 Text Interpretation:  Sinus or ectopic atrial rhythm RBBB and LAFB Probable lateral infarct, old no sig change from previos Confirmed by Charlesetta Shanks [phone]) on 03/26/2017 12:28:27 PM       Radiology No results found.  Procedures Procedures (including critical care time)  Medications Ordered in ED Medications - No data to display   Initial Impression / Assessment and Plan / ED Course  I have reviewed the triage vital signs and the nursing notes.  Pertinent labs & imaging results that were available during my care of the patient were reviewed by me and considered in my medical decision making (see chart for details).     Final Clinical Impressions(s) / ED Diagnoses   Final diagnoses:  General weakness  Near syncope  Patient's symptoms likely secondary to blood sugar lower than his baseline and he did exposure with prolonged standing. Patient did not have complete loss of consciousness. Symptoms were not sudden in onset. He had no associated chest pain. At this time, 2 sets of troponin are negative. Patient has been asymptomatic since having oral glucose by EMS. Return precautions are reviewed.  New Prescriptions Discharge Medication List as of 03/26/2017  4:20 PM       Charlesetta Shanks, MD 03/26/17 1622    Charlesetta Shanks, MD 05/20/17 2330

## 2017-05-21 ENCOUNTER — Other Ambulatory Visit: Payer: Self-pay | Admitting: Internal Medicine

## 2017-05-21 ENCOUNTER — Ambulatory Visit
Admission: RE | Admit: 2017-05-21 | Discharge: 2017-05-21 | Disposition: A | Discharge status: Home / Self Care | Payer: Medicare Other | Source: Ambulatory Visit | Attending: Internal Medicine | Admitting: Internal Medicine

## 2017-05-21 DIAGNOSIS — M79672 Pain in left foot: Secondary | ICD-10-CM

## 2017-05-24 ENCOUNTER — Ambulatory Visit: Payer: Medicare Other | Admitting: Podiatry

## 2017-05-27 DIAGNOSIS — R350 Frequency of micturition: Secondary | ICD-10-CM | POA: Insufficient documentation

## 2017-05-27 HISTORY — DX: Frequency of micturition: R35.0

## 2017-05-31 ENCOUNTER — Ambulatory Visit: Payer: Medicare Other | Admitting: Podiatry

## 2017-06-01 ENCOUNTER — Other Ambulatory Visit: Payer: Self-pay | Admitting: Interventional Cardiology

## 2017-06-01 DIAGNOSIS — R55 Syncope and collapse: Secondary | ICD-10-CM

## 2017-06-01 DIAGNOSIS — R42 Dizziness and giddiness: Secondary | ICD-10-CM

## 2017-06-01 HISTORY — DX: Syncope and collapse: R55

## 2017-06-02 ENCOUNTER — Ambulatory Visit (INDEPENDENT_AMBULATORY_CARE_PROVIDER_SITE_OTHER): Payer: Medicare Other

## 2017-06-02 DIAGNOSIS — R42 Dizziness and giddiness: Secondary | ICD-10-CM

## 2017-06-02 DIAGNOSIS — R55 Syncope and collapse: Secondary | ICD-10-CM | POA: Diagnosis not present

## 2017-06-03 ENCOUNTER — Other Ambulatory Visit: Payer: Self-pay | Admitting: Podiatry

## 2017-06-03 ENCOUNTER — Ambulatory Visit (INDEPENDENT_AMBULATORY_CARE_PROVIDER_SITE_OTHER): Payer: Medicare Other

## 2017-06-03 ENCOUNTER — Encounter: Payer: Self-pay | Admitting: Podiatry

## 2017-06-03 ENCOUNTER — Ambulatory Visit (INDEPENDENT_AMBULATORY_CARE_PROVIDER_SITE_OTHER): Payer: Medicare Other | Admitting: Podiatry

## 2017-06-03 VITALS — BP 98/66 | HR 102 | Resp 16 | Ht 70.0 in | Wt 195.0 lb

## 2017-06-03 DIAGNOSIS — M79671 Pain in right foot: Secondary | ICD-10-CM | POA: Diagnosis not present

## 2017-06-03 DIAGNOSIS — S92301A Fracture of unspecified metatarsal bone(s), right foot, initial encounter for closed fracture: Secondary | ICD-10-CM

## 2017-06-03 DIAGNOSIS — M79672 Pain in left foot: Secondary | ICD-10-CM | POA: Diagnosis not present
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
## 2017-06-23 ENCOUNTER — Telehealth: Payer: Self-pay | Admitting: Cardiovascular Disease

## 2017-06-23 NOTE — Telephone Encounter (Signed)
New message    1. Is this related to a heart monitor you are wearing?  (If the patient says no, please ask     if they are caling about ICD/pacemaker.) YES  2. What is your issue?? Pt wants to know results for the monitor after wearing it

## 2017-06-23 NOTE — Telephone Encounter (Signed)
Spoke with patient's wife, Jobe Gibbon, who called to ask for monitor results for patient. I advised that the results need to be reported by the ordering provider which is patient's PCP. She verbalized understanding. We scheduled patient's appointment for December with Dr. Acie Fredrickson and she asked about fasting lab work. I advised that if patient does not get labs at [facility] for him to come in prepared to get lab work at the appointment. She verbalized understanding and thanked me for the call.

## 2017-06-24 ENCOUNTER — Other Ambulatory Visit: Payer: Self-pay | Admitting: Podiatry

## 2017-06-24 ENCOUNTER — Ambulatory Visit (INDEPENDENT_AMBULATORY_CARE_PROVIDER_SITE_OTHER): Payer: Medicare Other

## 2017-06-24 ENCOUNTER — Encounter: Payer: Self-pay | Admitting: Podiatry

## 2017-06-24 ENCOUNTER — Ambulatory Visit (INDEPENDENT_AMBULATORY_CARE_PROVIDER_SITE_OTHER): Payer: Medicare Other | Admitting: Podiatry

## 2017-06-24 DIAGNOSIS — S92301A Fracture of unspecified metatarsal bone(s), right foot, initial encounter for closed fracture: Secondary | ICD-10-CM | POA: Diagnosis not present

## 2017-06-24 DIAGNOSIS — M79672 Pain in left foot: Secondary | ICD-10-CM

## 2017-06-29 ENCOUNTER — Ambulatory Visit: Payer: Medicare Other | Admitting: Cardiovascular Disease

## 2017-06-30 NOTE — Progress Notes (Signed)
Subjective:    Patient ID: [name] [name], male   DOB: 82 y.o.   MRN: [id number]   HPI patient states he is not having a lot of pain and is able to ambulate well and is continuing immobilization    ROS      Objective:  Physical Exam neurovascular status intact with continued mild discomfort around the fifth metatarsal that has improved with reduced edema present     Assessment:    Significant fifth metatarsal shaft fracture that so far clinically is showing minimal symptoms     Plan:   X-ray reviewed with patient and at this time were to go ahead and gradually allow him to wear a rigid bottom shoe with the understanding that this still may end up requiring surgery if persistent swelling or pain were to occur. Reappoint 4 weeks or earlier if needed  X-ray indicates there still quite a bit healing to go but no indication of worsening of condition

## 2017-07-04 ENCOUNTER — Emergency Department (HOSPITAL_COMMUNITY)
Admission: EM | Admit: 2017-07-04 | Discharge: 2017-07-04 | Disposition: A | Discharge status: Home / Self Care | Payer: Medicare Other | Attending: Emergency Medicine | Admitting: Emergency Medicine

## 2017-07-04 ENCOUNTER — Emergency Department (HOSPITAL_COMMUNITY): Payer: Medicare Other

## 2017-07-04 ENCOUNTER — Encounter (HOSPITAL_COMMUNITY): Payer: Self-pay | Admitting: *Deleted

## 2017-07-04 DIAGNOSIS — Y998 Other external cause status: Secondary | ICD-10-CM | POA: Insufficient documentation

## 2017-07-04 DIAGNOSIS — S065X9A Traumatic subdural hemorrhage with loss of consciousness of unspecified duration, initial encounter: Secondary | ICD-10-CM

## 2017-07-04 DIAGNOSIS — Z79899 Other long term (current) drug therapy: Secondary | ICD-10-CM | POA: Diagnosis not present

## 2017-07-04 DIAGNOSIS — R202 Paresthesia of skin: Secondary | ICD-10-CM | POA: Diagnosis present

## 2017-07-04 DIAGNOSIS — E119 Type 2 diabetes mellitus without complications: Secondary | ICD-10-CM | POA: Diagnosis not present

## 2017-07-04 DIAGNOSIS — Y939 Activity, unspecified: Secondary | ICD-10-CM | POA: Diagnosis not present

## 2017-07-04 DIAGNOSIS — Y92410 Unspecified street and highway as the place of occurrence of the external cause: Secondary | ICD-10-CM | POA: Insufficient documentation

## 2017-07-04 DIAGNOSIS — Z7982 Long term (current) use of aspirin: Secondary | ICD-10-CM | POA: Insufficient documentation

## 2017-07-04 DIAGNOSIS — Z87891 Personal history of nicotine dependence: Secondary | ICD-10-CM | POA: Insufficient documentation

## 2017-07-04 DIAGNOSIS — E785 Hyperlipidemia, unspecified: Secondary | ICD-10-CM | POA: Insufficient documentation

## 2017-07-04 DIAGNOSIS — I10 Essential (primary) hypertension: Secondary | ICD-10-CM | POA: Insufficient documentation

## 2017-07-04 DIAGNOSIS — I252 Old myocardial infarction: Secondary | ICD-10-CM | POA: Insufficient documentation

## 2017-07-04 DIAGNOSIS — S065XAA Traumatic subdural hemorrhage with loss of consciousness status unknown, initial encounter: Secondary | ICD-10-CM

## 2017-07-04 DIAGNOSIS — S065X0D Traumatic subdural hemorrhage without loss of consciousness, subsequent encounter: Secondary | ICD-10-CM | POA: Insufficient documentation

## 2017-07-04 DIAGNOSIS — J45909 Unspecified asthma, uncomplicated: Secondary | ICD-10-CM | POA: Insufficient documentation

## 2017-07-04 LAB — I-STAT CHEM 8, ED
BUN: 16 mg/dL (ref 6–20)
Calcium, Ion: 1.12 mmol/L — ABNORMAL LOW (ref 1.15–1.40)
Chloride: 100 mmol/L — ABNORMAL LOW (ref 101–111)
Creatinine, Ser: 0.7 mg/dL (ref 0.61–1.24)
Glucose, Bld: 117 mg/dL — ABNORMAL HIGH (ref 65–99)
HCT: 36 % — ABNORMAL LOW (ref 39.0–52.0)
Hemoglobin: 12.2 g/dL — ABNORMAL LOW (ref 13.0–17.0)
Potassium: 3.6 mmol/L (ref 3.5–5.1)
Sodium: 138 mmol/L (ref 135–145)
TCO2: 26 mmol/L (ref 22–32)

## 2017-07-04 LAB — URINALYSIS, ROUTINE W REFLEX MICROSCOPIC
Bilirubin Urine: NEGATIVE
Glucose, UA: NEGATIVE mg/dL
Hgb urine dipstick: NEGATIVE
Ketones, ur: NEGATIVE mg/dL
Leukocytes, UA: NEGATIVE
Nitrite: NEGATIVE
Protein, ur: NEGATIVE mg/dL
Specific Gravity, Urine: 1.005 (ref 1.005–1.030)
pH: 6 (ref 5.0–8.0)

## 2017-07-04 LAB — PROTIME-INR
INR: 0.97
Prothrombin Time: 12.8 seconds (ref 11.4–15.2)

## 2017-07-04 LAB — COMPREHENSIVE METABOLIC PANEL
ALT: 12 U/L — ABNORMAL LOW (ref 17–63)
AST: 17 U/L (ref 15–41)
Albumin: 3.5 g/dL (ref 3.5–5.0)
Alkaline Phosphatase: 63 U/L (ref 38–126)
Anion gap: 9 (ref 5–15)
BUN: 15 mg/dL (ref 6–20)
CO2: 24 mmol/L (ref 22–32)
Calcium: 8.8 mg/dL — ABNORMAL LOW (ref 8.9–10.3)
Chloride: 102 mmol/L (ref 101–111)
Creatinine, Ser: 0.82 mg/dL (ref 0.61–1.24)
GFR calc Af Amer: >60 mL/min (ref >60)
GFR calc non Af Amer: >60 mL/min (ref >60)
Glucose, Bld: 115 mg/dL — ABNORMAL HIGH (ref 65–99)
Potassium: 3.5 mmol/L (ref 3.5–5.1)
Sodium: 135 mmol/L (ref 135–145)
Total Bilirubin: 0.6 mg/dL (ref 0.3–1.2)
Total Protein: 6.7 g/dL (ref 6.5–8.1)

## 2017-07-04 LAB — CBC
HCT: 37.1 % — ABNORMAL LOW (ref 39.0–52.0)
Hemoglobin: 12.1 g/dL — ABNORMAL LOW (ref 13.0–17.0)
MCH: 27.8 pg (ref 26.0–34.0)
MCHC: 32.6 g/dL (ref 30.0–36.0)
MCV: 85.1 fL (ref 78.0–100.0)
Platelets: 226 10*3/uL (ref 150–400)
RBC: 4.36 MIL/uL (ref 4.22–5.81)
RDW: 13.7 % (ref 11.5–15.5)
WBC: 7.4 10*3/uL (ref 4.0–10.5)

## 2017-07-04 LAB — DIFFERENTIAL
Basophils Absolute: 0.1 10*3/uL (ref 0.0–0.1)
Basophils Relative: 1 %
Eosinophils Absolute: 0.2 10*3/uL (ref 0.0–0.7)
Eosinophils Relative: 3 %
Lymphocytes Relative: 27 %
Lymphs Abs: 2 10*3/uL (ref 0.7–4.0)
Monocytes Absolute: 0.6 10*3/uL (ref 0.1–1.0)
Monocytes Relative: 8 %
Neutro Abs: 4.6 10*3/uL (ref 1.7–7.7)
Neutrophils Relative %: 61 %

## 2017-07-04 LAB — APTT: aPTT: 22 seconds — ABNORMAL LOW (ref 24–36)

## 2017-07-04 LAB — I-STAT TROPONIN, ED: Troponin i, poc: 0 ng/mL (ref 0.00–0.08)

## 2017-07-04 LAB — RAPID URINE DRUG SCREEN, HOSP PERFORMED
Amphetamines: NOT DETECTED
Barbiturates: NOT DETECTED
Benzodiazepines: NOT DETECTED
Cocaine: NOT DETECTED
Opiates: NOT DETECTED
Tetrahydrocannabinol: NOT DETECTED

## 2017-07-04 LAB — ETHANOL: Alcohol, Ethyl (B): <5 mg/dL (ref <5)

## 2017-07-04 MED ORDER — SODIUM CHLORIDE 0.9 % IV SOLN
1000.0000 mg | Freq: Once | INTRAVENOUS | Status: AC
  Administered 2017-07-04: 1000 mg via INTRAVENOUS
  Filled 2017-07-04: qty 10

## 2017-07-04 MED ORDER — LEVETIRACETAM 500 MG PO TABS: 500.0000 mg | ORAL_TABLET | Freq: Two times a day (BID) | ORAL | 0 refills | Status: DC

## 2017-07-04 NOTE — ED Triage Notes (Signed)
The pt arrived by [facility]  From home  He got up from bed had sob used his inhaler  Numbness in hi rt forearm and rt face for 10 minutes  Then it went away.  No stroke symptoms  2 days ago he had the same symptoms     cbg 128  No pain some dizziness nausea
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
## 2017-07-04 NOTE — ED Notes (Signed)
Pt to radiology via stretcher.  

## 2017-07-04 NOTE — ED Notes (Signed)
The pts speech is clear moves all extremities

## 2017-07-04 NOTE — ED Notes (Signed)
Speech clear no pain no distress

## 2017-07-04 NOTE — ED Notes (Signed)
Pt to xray via stretcher

## 2017-07-04 NOTE — ED Notes (Signed)
Delay in lab draw,  Pt in xray. 

## 2017-07-06 ENCOUNTER — Other Ambulatory Visit: Payer: Self-pay | Admitting: Neurosurgery

## 2017-07-06 DIAGNOSIS — S065X9A Traumatic subdural hemorrhage with loss of consciousness of unspecified duration, initial encounter: Secondary | ICD-10-CM

## 2017-07-09 ENCOUNTER — Ambulatory Visit
Admission: RE | Admit: 2017-07-09 | Discharge: 2017-07-09 | Disposition: A | Discharge status: Home / Self Care | Payer: Medicare Other | Source: Ambulatory Visit | Attending: Neurosurgery | Admitting: Neurosurgery

## 2017-07-09 DIAGNOSIS — S065X9A Traumatic subdural hemorrhage with loss of consciousness of unspecified duration, initial encounter: Secondary | ICD-10-CM

## 2017-07-12 ENCOUNTER — Other Ambulatory Visit: Payer: Self-pay | Admitting: Neurosurgery

## 2017-07-12 DIAGNOSIS — S065XAA Traumatic subdural hemorrhage with loss of consciousness status unknown, initial encounter: Secondary | ICD-10-CM

## 2017-07-12 DIAGNOSIS — S065X9A Traumatic subdural hemorrhage with loss of consciousness of unspecified duration, initial encounter: Secondary | ICD-10-CM

## 2017-07-14 ENCOUNTER — Ambulatory Visit: Payer: Medicare Other | Admitting: Neurology

## 2017-07-15 ENCOUNTER — Ambulatory Visit
Admission: RE | Admit: 2017-07-15 | Discharge: 2017-07-15 | Disposition: A | Discharge status: Home / Self Care | Payer: Medicare Other | Source: Ambulatory Visit | Attending: Neurosurgery | Admitting: Neurosurgery

## 2017-07-15 DIAGNOSIS — S065XAA Traumatic subdural hemorrhage with loss of consciousness status unknown, initial encounter: Secondary | ICD-10-CM

## 2017-07-15 DIAGNOSIS — S065X9A Traumatic subdural hemorrhage with loss of consciousness of unspecified duration, initial encounter: Secondary | ICD-10-CM

## 2017-08-18 ENCOUNTER — Ambulatory Visit (INDEPENDENT_AMBULATORY_CARE_PROVIDER_SITE_OTHER): Payer: Medicare Other | Admitting: Ophthalmology

## 2017-08-18 ENCOUNTER — Other Ambulatory Visit: Payer: Self-pay | Admitting: Neurosurgery

## 2017-08-18 DIAGNOSIS — S065XAA Traumatic subdural hemorrhage with loss of consciousness status unknown, initial encounter: Secondary | ICD-10-CM

## 2017-08-18 DIAGNOSIS — H353112 Nonexudative age-related macular degeneration, right eye, intermediate dry stage: Secondary | ICD-10-CM

## 2017-08-18 DIAGNOSIS — H43813 Vitreous degeneration, bilateral: Secondary | ICD-10-CM

## 2017-08-18 DIAGNOSIS — I10 Essential (primary) hypertension: Secondary | ICD-10-CM

## 2017-08-18 DIAGNOSIS — S065X9A Traumatic subdural hemorrhage with loss of consciousness of unspecified duration, initial encounter: Secondary | ICD-10-CM

## 2017-08-18 DIAGNOSIS — H35033 Hypertensive retinopathy, bilateral: Secondary | ICD-10-CM | POA: Diagnosis not present

## 2017-08-18 DIAGNOSIS — H353121 Nonexudative age-related macular degeneration, left eye, early dry stage: Secondary | ICD-10-CM | POA: Diagnosis not present

## 2017-08-26 ENCOUNTER — Ambulatory Visit
Admission: RE | Admit: 2017-08-26 | Discharge: 2017-08-26 | Disposition: A | Discharge status: Home / Self Care | Payer: Medicare Other | Source: Ambulatory Visit | Attending: Neurosurgery | Admitting: Neurosurgery

## 2017-08-26 DIAGNOSIS — S065X9A Traumatic subdural hemorrhage with loss of consciousness of unspecified duration, initial encounter: Secondary | ICD-10-CM

## 2017-08-26 DIAGNOSIS — S065XAA Traumatic subdural hemorrhage with loss of consciousness status unknown, initial encounter: Secondary | ICD-10-CM

## 2017-09-15 ENCOUNTER — Other Ambulatory Visit: Payer: Self-pay | Admitting: Cardiovascular Disease

## 2017-09-15 ENCOUNTER — Encounter: Payer: Self-pay | Admitting: Cardiovascular Disease

## 2017-09-23 ENCOUNTER — Ambulatory Visit: Payer: Medicare Other | Admitting: Cardiovascular Disease

## 2017-09-23 ENCOUNTER — Encounter: Payer: Self-pay | Admitting: Cardiovascular Disease

## 2017-09-23 VITALS — BP 120/74 | HR 92 | Ht 70.0 in | Wt 211.4 lb

## 2017-09-23 DIAGNOSIS — I10 Essential (primary) hypertension: Secondary | ICD-10-CM | POA: Diagnosis not present

## 2017-09-23 DIAGNOSIS — R9389 Abnormal findings on diagnostic imaging of other specified body structures: Secondary | ICD-10-CM | POA: Diagnosis not present

## 2017-09-23 MED ORDER — BISOPROLOL FUMARATE 5 MG PO TABS: 5.0000 mg | ORAL_TABLET | Freq: Every day | ORAL | 3 refills | Status: DC

## 2017-09-23 MED ORDER — AMLODIPINE BESYLATE 2.5 MG PO TABS: 2.5000 mg | ORAL_TABLET | Freq: Every day | ORAL | 3 refills | Status: DC

## 2017-09-23 NOTE — Patient Instructions (Addendum)
Medication Instructions:  Your physician has recommended you make the following change in your medication:  DECREASE Amlodipine (Norvasc) to 2.5 mg once daily START Bisoprolol (Zebeta) 5 mg once daily   Labwork: None Ordered   Testing/Procedures: Your physician has requested that you have an echocardiogram. Echocardiography is a painless test that uses sound waves to create images of your heart. It provides your doctor with information about the size and shape of your heart and how well your heart's chambers and valves are working. This procedure takes approximately one hour. There are no restrictions for this procedure.   Follow-Up: Your physician recommends that you schedule a follow-up appointment in: 3 months with  Dr. Acie Fredrickson.   If you need a refill on your cardiac medications before your next appointment, please call your pharmacy.   Thank you for choosing [facility]! Christen Bame, RN [phone]
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
## 2017-09-26 ENCOUNTER — Encounter (HOSPITAL_COMMUNITY): Payer: Self-pay

## 2017-09-26 ENCOUNTER — Emergency Department (HOSPITAL_COMMUNITY): Payer: Medicare Other

## 2017-09-26 ENCOUNTER — Observation Stay (HOSPITAL_COMMUNITY)
Admission: EM | Admit: 2017-09-26 | Discharge: 2017-09-27 | Disposition: A | Discharge status: Home / Self Care | Payer: Medicare Other | Attending: Internal Medicine | Admitting: Internal Medicine

## 2017-09-26 ENCOUNTER — Other Ambulatory Visit: Payer: Self-pay

## 2017-09-26 DIAGNOSIS — Z7902 Long term (current) use of antithrombotics/antiplatelets: Secondary | ICD-10-CM | POA: Insufficient documentation

## 2017-09-26 DIAGNOSIS — Z7984 Long term (current) use of oral hypoglycemic drugs: Secondary | ICD-10-CM | POA: Insufficient documentation

## 2017-09-26 DIAGNOSIS — Z79899 Other long term (current) drug therapy: Secondary | ICD-10-CM | POA: Diagnosis not present

## 2017-09-26 DIAGNOSIS — I10 Essential (primary) hypertension: Secondary | ICD-10-CM | POA: Diagnosis not present

## 2017-09-26 DIAGNOSIS — I2510 Atherosclerotic heart disease of native coronary artery without angina pectoris: Secondary | ICD-10-CM | POA: Diagnosis not present

## 2017-09-26 DIAGNOSIS — R0602 Shortness of breath: Secondary | ICD-10-CM | POA: Diagnosis present

## 2017-09-26 DIAGNOSIS — Z880 Allergy status to penicillin: Secondary | ICD-10-CM | POA: Diagnosis not present

## 2017-09-26 DIAGNOSIS — J45909 Unspecified asthma, uncomplicated: Secondary | ICD-10-CM | POA: Diagnosis not present

## 2017-09-26 DIAGNOSIS — E785 Hyperlipidemia, unspecified: Secondary | ICD-10-CM | POA: Diagnosis not present

## 2017-09-26 DIAGNOSIS — E782 Mixed hyperlipidemia: Secondary | ICD-10-CM

## 2017-09-26 DIAGNOSIS — N400 Enlarged prostate without lower urinary tract symptoms: Secondary | ICD-10-CM | POA: Insufficient documentation

## 2017-09-26 DIAGNOSIS — J209 Acute bronchitis, unspecified: Secondary | ICD-10-CM | POA: Diagnosis not present

## 2017-09-26 DIAGNOSIS — G4730 Sleep apnea, unspecified: Secondary | ICD-10-CM | POA: Diagnosis not present

## 2017-09-26 DIAGNOSIS — Z87891 Personal history of nicotine dependence: Secondary | ICD-10-CM | POA: Insufficient documentation

## 2017-09-26 DIAGNOSIS — J9601 Acute respiratory failure with hypoxia: Secondary | ICD-10-CM | POA: Diagnosis not present

## 2017-09-26 DIAGNOSIS — Z8709 Personal history of other diseases of the respiratory system: Secondary | ICD-10-CM

## 2017-09-26 DIAGNOSIS — E119 Type 2 diabetes mellitus without complications: Secondary | ICD-10-CM | POA: Diagnosis not present

## 2017-09-26 DIAGNOSIS — Z881 Allergy status to other antibiotic agents status: Secondary | ICD-10-CM | POA: Diagnosis not present

## 2017-09-26 DIAGNOSIS — Z888 Allergy status to other drugs, medicaments and biological substances status: Secondary | ICD-10-CM | POA: Insufficient documentation

## 2017-09-26 DIAGNOSIS — I252 Old myocardial infarction: Secondary | ICD-10-CM | POA: Diagnosis not present

## 2017-09-26 DIAGNOSIS — R531 Weakness: Secondary | ICD-10-CM

## 2017-09-26 HISTORY — DX: Personal history of other diseases of the respiratory system: Z87.09

## 2017-09-26 LAB — CBC WITH DIFFERENTIAL/PLATELET
Basophils Absolute: 0 10*3/uL (ref 0.0–0.1)
Basophils Relative: 0 %
Eosinophils Absolute: 0.1 10*3/uL (ref 0.0–0.7)
Eosinophils Relative: 1 %
HCT: 40.3 % (ref 39.0–52.0)
Hemoglobin: 13.9 g/dL (ref 13.0–17.0)
Lymphocytes Relative: 16 %
Lymphs Abs: 1.2 10*3/uL (ref 0.7–4.0)
MCH: 29.6 pg (ref 26.0–34.0)
MCHC: 34.5 g/dL (ref 30.0–36.0)
MCV: 85.9 fL (ref 78.0–100.0)
Monocytes Absolute: 0.7 10*3/uL (ref 0.1–1.0)
Monocytes Relative: 9 %
Neutro Abs: 5.4 10*3/uL (ref 1.7–7.7)
Neutrophils Relative %: 74 %
Platelets: 243 10*3/uL (ref 150–400)
RBC: 4.69 MIL/uL (ref 4.22–5.81)
RDW: 14.4 % (ref 11.5–15.5)
WBC Morphology: INCREASED
WBC: 7.4 10*3/uL (ref 4.0–10.5)

## 2017-09-26 LAB — COMPREHENSIVE METABOLIC PANEL
ALT: 13 U/L — ABNORMAL LOW (ref 17–63)
AST: 18 U/L (ref 15–41)
Albumin: 3.9 g/dL (ref 3.5–5.0)
Alkaline Phosphatase: 61 U/L (ref 38–126)
Anion gap: 11 (ref 5–15)
BUN: 16 mg/dL (ref 6–20)
CO2: 26 mmol/L (ref 22–32)
Calcium: 8.9 mg/dL (ref 8.9–10.3)
Chloride: 97 mmol/L — ABNORMAL LOW (ref 101–111)
Creatinine, Ser: 0.97 mg/dL (ref 0.61–1.24)
GFR calc Af Amer: >60 mL/min (ref >60)
GFR calc non Af Amer: >60 mL/min (ref >60)
Glucose, Bld: 136 mg/dL — ABNORMAL HIGH (ref 65–99)
Potassium: 3.7 mmol/L (ref 3.5–5.1)
Sodium: 134 mmol/L — ABNORMAL LOW (ref 135–145)
Total Bilirubin: 0.8 mg/dL (ref 0.3–1.2)
Total Protein: 7.2 g/dL (ref 6.5–8.1)

## 2017-09-26 LAB — GLUCOSE, CAPILLARY
Glucose-Capillary: 238 mg/dL — ABNORMAL HIGH (ref 65–99)
Glucose-Capillary: 203 mg/dL — ABNORMAL HIGH (ref 65–99)

## 2017-09-26 LAB — INFLUENZA PANEL BY PCR (TYPE A & B)
Influenza A By PCR: POSITIVE — AB
Influenza B By PCR: NEGATIVE

## 2017-09-26 LAB — TROPONIN I: Troponin I: <0.03 ng/mL (ref <0.03)

## 2017-09-26 LAB — BRAIN NATRIURETIC PEPTIDE: B Natriuretic Peptide: 21.2 pg/mL (ref 0.0–100.0)

## 2017-09-26 MED ORDER — OSELTAMIVIR PHOSPHATE 75 MG PO CAPS
75.0000 mg | ORAL_CAPSULE | Freq: Two times a day (BID) | ORAL | Status: DC
  Administered 2017-09-26 – 2017-09-27 (×2): 75 mg via ORAL
  Filled 2017-09-26 (×3): qty 1

## 2017-09-26 MED ORDER — AZITHROMYCIN 250 MG PO TABS: 500.0000 mg | ORAL_TABLET | Freq: Every day | ORAL | Status: DC

## 2017-09-26 MED ORDER — IPRATROPIUM-ALBUTEROL 0.5-2.5 (3) MG/3ML IN SOLN
3.0000 mL | Freq: Four times a day (QID) | RESPIRATORY_TRACT | Status: DC
  Administered 2017-09-26: 3 mL via RESPIRATORY_TRACT
  Filled 2017-09-26: qty 3

## 2017-09-26 MED ORDER — IPRATROPIUM-ALBUTEROL 0.5-2.5 (3) MG/3ML IN SOLN: 3.0000 mL | RESPIRATORY_TRACT | Status: DC | PRN

## 2017-09-26 MED ORDER — INSULIN ASPART 100 UNIT/ML ~~LOC~~ SOLN: 0.0000 [IU] | Freq: Every day | SUBCUTANEOUS | Status: DC

## 2017-09-26 MED ORDER — ENOXAPARIN SODIUM 40 MG/0.4ML ~~LOC~~ SOLN
40.0000 mg | SUBCUTANEOUS | Status: DC
  Administered 2017-09-26: 40 mg via SUBCUTANEOUS
  Filled 2017-09-26: qty 0.4

## 2017-09-26 MED ORDER — LEVOFLOXACIN IN D5W 750 MG/150ML IV SOLN: 750.0000 mg | INTRAVENOUS | Status: DC

## 2017-09-26 MED ORDER — ACETAMINOPHEN 650 MG RE SUPP: 650.0000 mg | Freq: Four times a day (QID) | RECTAL | Status: DC | PRN

## 2017-09-26 MED ORDER — ROSUVASTATIN CALCIUM 10 MG PO TABS
10.0000 mg | ORAL_TABLET | Freq: Every day | ORAL | Status: DC
  Administered 2017-09-27: 10 mg via ORAL
  Filled 2017-09-26: qty 1

## 2017-09-26 MED ORDER — BISOPROLOL FUMARATE 5 MG PO TABS
5.0000 mg | ORAL_TABLET | Freq: Every day | ORAL | Status: DC
  Administered 2017-09-27: 5 mg via ORAL
  Filled 2017-09-26: qty 1

## 2017-09-26 MED ORDER — LEVOFLOXACIN IN D5W 750 MG/150ML IV SOLN
750.0000 mg | Freq: Once | INTRAVENOUS | Status: AC
  Administered 2017-09-26: 750 mg via INTRAVENOUS
  Filled 2017-09-26: qty 150

## 2017-09-26 MED ORDER — METHYLPREDNISOLONE SODIUM SUCC 40 MG IJ SOLR
40.0000 mg | Freq: Two times a day (BID) | INTRAMUSCULAR | Status: DC
  Administered 2017-09-26 – 2017-09-27 (×2): 40 mg via INTRAVENOUS
  Filled 2017-09-26 (×2): qty 1

## 2017-09-26 MED ORDER — INSULIN ASPART 100 UNIT/ML ~~LOC~~ SOLN
0.0000 [IU] | Freq: Three times a day (TID) | SUBCUTANEOUS | Status: DC
  Administered 2017-09-26: 7 [IU] via SUBCUTANEOUS
  Administered 2017-09-27: 3 [IU] via SUBCUTANEOUS

## 2017-09-26 MED ORDER — AZITHROMYCIN 250 MG PO TABS: 250.0000 mg | ORAL_TABLET | Freq: Every day | ORAL | Status: DC

## 2017-09-26 MED ORDER — BENZONATATE 100 MG PO CAPS: 200.0000 mg | ORAL_CAPSULE | Freq: Three times a day (TID) | ORAL | Status: DC | PRN

## 2017-09-26 MED ORDER — LOSARTAN POTASSIUM 50 MG PO TABS
100.0000 mg | ORAL_TABLET | Freq: Every day | ORAL | Status: DC
  Filled 2017-09-26: qty 2

## 2017-09-26 MED ORDER — KETOROLAC TROMETHAMINE 15 MG/ML IJ SOLN: 15.0000 mg | Freq: Four times a day (QID) | INTRAMUSCULAR | Status: DC | PRN

## 2017-09-26 MED ORDER — IPRATROPIUM-ALBUTEROL 0.5-2.5 (3) MG/3ML IN SOLN
3.0000 mL | Freq: Three times a day (TID) | RESPIRATORY_TRACT | Status: DC
  Filled 2017-09-26: qty 3

## 2017-09-26 MED ORDER — AMLODIPINE BESYLATE 5 MG PO TABS
2.5000 mg | ORAL_TABLET | Freq: Every day | ORAL | Status: DC
  Filled 2017-09-26: qty 1

## 2017-09-26 MED ORDER — DEXAMETHASONE SODIUM PHOSPHATE 10 MG/ML IJ SOLN
10.0000 mg | Freq: Once | INTRAMUSCULAR | Status: AC
  Administered 2017-09-26: 10 mg via INTRAVENOUS
  Filled 2017-09-26: qty 1

## 2017-09-26 MED ORDER — PANTOPRAZOLE SODIUM 40 MG PO TBEC
40.0000 mg | DELAYED_RELEASE_TABLET | Freq: Every day | ORAL | Status: DC
  Administered 2017-09-27: 40 mg via ORAL
  Filled 2017-09-26: qty 1

## 2017-09-26 MED ORDER — LATANOPROST 0.005 % OP SOLN
1.0000 [drp] | Freq: Every day | OPHTHALMIC | Status: DC
  Administered 2017-09-26: 1 [drp] via OPHTHALMIC
  Filled 2017-09-26: qty 2.5

## 2017-09-26 MED ORDER — SODIUM CHLORIDE 0.9 % IV SOLN
INTRAVENOUS | Status: DC
  Administered 2017-09-26 – 2017-09-27 (×2): via INTRAVENOUS

## 2017-09-26 MED ORDER — ALBUTEROL SULFATE (2.5 MG/3ML) 0.083% IN NEBU
5.0000 mg | INHALATION_SOLUTION | Freq: Once | RESPIRATORY_TRACT | Status: AC
  Administered 2017-09-26: 5 mg via RESPIRATORY_TRACT
  Filled 2017-09-26: qty 6

## 2017-09-26 MED ORDER — FINASTERIDE 5 MG PO TABS
5.0000 mg | ORAL_TABLET | Freq: Every day | ORAL | Status: DC
  Administered 2017-09-27: 5 mg via ORAL
  Filled 2017-09-26: qty 1

## 2017-09-26 MED ORDER — ACETAMINOPHEN 325 MG PO TABS: 650.0000 mg | ORAL_TABLET | Freq: Four times a day (QID) | ORAL | Status: DC | PRN

## 2017-09-26 MED ORDER — TRIAMTERENE-HCTZ 37.5-25 MG PO CAPS
1.0000 | ORAL_CAPSULE | Freq: Every morning | ORAL | Status: DC
  Administered 2017-09-27: 1 via ORAL
  Filled 2017-09-26: qty 1

## 2017-09-26 NOTE — H&P (Signed)
History and Physical    PHYLLIP CLAW BDZ:[id number] DOB: 1935-02-14 DOA: 09/26/2017  PCP: Leeroy Cha, MD  Patient coming from: Home  Chief Complaint: Cough, SOB  HPI: [name] [name] is a 82 y.o. male with medical history significant of asthma, BPH, DM2 on metformin, CAD presents to ED with increased SOB, cough. Patient's symptoms started 2 weeks prior with cough and sob. Patient was seen by provider who prescribed Zpak. Symptoms improved slightly and pt was seen again by provider at [facility] center who prescribed second round of Zpak with steroid taper, completed course one week before admission. Symptoms improved markedly, however again began to worsen. On the AM of admission, patient reported marked weakness involving B LE with increased cough and feeling "worn down." Patient did receive flu vaccine this year.  ED Course: In ED, pt noted to have wheezing with O2 sat of 89% on room air. CXR findings suggestive of bronchitis. Patient given neb tx and steroid with improvement. Hospitalist consulted for consideration for admission.  Review of Systems:  Review of Systems  Constitutional: Positive for malaise/fatigue. Negative for chills and fever.  HENT: Positive for congestion. Negative for ear discharge, ear pain and nosebleeds.   Eyes: Negative for double vision, photophobia and pain.  Respiratory: Positive for cough, shortness of breath and wheezing. Negative for stridor.   Cardiovascular: Negative for palpitations, orthopnea and claudication.  Gastrointestinal: Negative for abdominal pain, constipation, nausea and vomiting.  Genitourinary: Negative for frequency, hematuria and urgency.  Musculoskeletal: Negative for back pain, joint pain and neck pain.  Neurological: Positive for weakness. Negative for tingling, tremors, focal weakness, seizures and loss of consciousness.  Psychiatric/Behavioral: Negative for hallucinations and memory loss. The patient does not have insomnia.      Past Medical History:  Diagnosis Date  . Asthma   . BPH (benign prostatic hypertrophy)   . Diabetes ([place])   . History of orthostatic hypotension   . Hyperlipidemia   . Hypertension   . MI (myocardial infarction) ([place])   . Prostate cancer ([place])   . Rheumatic fever   . Sleep apnea     Past Surgical History:  Procedure Laterality Date  . APPENDECTOMY  1954  . CATARACT EXTRACTION Left 03/25/2017  . HERNIA REPAIR    . INCISION AND DRAINAGE ABSCESS Right 04/02/2016   Procedure: INCISION AND DRAINAGE ABSCESS;  Surgeon: Leanora Cover, MD;  Location: [place];  Service: Orthopedics;  Laterality: Right;  Marland Kitchen VASECTOMY       reports that he quit smoking about 4 years ago. His smoking use included cigarettes and pipe. He has a 30.00 pack-year smoking history. he has never used smokeless tobacco. He reports that he does not drink alcohol or use drugs.  Allergies  Allergen Reactions  . Doxycycline Monohydrate Hives  . Penicillins Hives  . Fluticasone-Salmeterol     REACTION: sensation of throat closing REACTION: sensation of throat closing  . Lisinopril Cough  . Prednisone     Per TM:HDQQIWL agitation Per NL:GXQJJHE agitation  . Shark Cartilage   . Tetracyclines & Related Hives  . Tetracycline Rash    Family History  Problem Relation Age of Onset  . Heart failure Mother   . Arthritis Other   . Diabetes Other   . Heart disease Other   . Hyperlipidemia Other   . Kidney disease Other   . Obesity Other     Prior to Admission medications   Medication Sig Start Date End Date Taking? Authorizing Provider  acetaminophen (TYLENOL) 500 MG tablet Take 500 mg by mouth 2 (two) times daily as needed.    [provider]  albuterol (PROAIR HFA) 108 (90 BASE) MCG/ACT inhaler Inhale 2 puffs into the lungs every 6 (six) hours as needed for wheezing. 07/06/13   Collene Gobble, MD  amLODipine (NORVASC) 2.5 MG tablet Take 1 tablet (2.5 mg total) by mouth daily. 09/23/17  12/22/17  Nahser, Wonda Cheng, MD  benzonatate (TESSALON) 200 MG capsule Take 200 mg by mouth 3 (three) times daily as needed for cough.    [provider]  bisoprolol (ZEBETA) 5 MG tablet Take 1 tablet (5 mg total) by mouth daily. 09/23/17   Nahser, Wonda Cheng, MD  cetirizine (ZYRTEC) 10 MG tablet Take 10 mg by mouth daily.    [provider]  cholecalciferol (VITAMIN D) 1000 units tablet Take 1,000 Units by mouth daily.    [provider]  famotidine (PEPCID) 20 MG tablet Take 20 mg by mouth daily.     [provider]  finasteride (PROSCAR) 5 MG tablet Take 5 mg by mouth daily.      [provider]  fluticasone (FLONASE) 50 MCG/ACT nasal spray Place 2 sprays into the nose daily.  02/22/13   [provider]  latanoprost (XALATAN) 0.005 % ophthalmic solution Place 1 drop into both eyes daily. 08/07/16   [provider]  losartan (COZAAR) 100 MG tablet Take 1 tablet (100 mg total) by mouth daily. 04/05/17   Nahser, Wonda Cheng, MD  magnesium oxide (MAG-OX) 400 MG tablet Take 400 mg by mouth daily.    [provider]  metFORMIN (GLUCOPHAGE) 500 MG tablet Take 1,000 mg by mouth at bedtime.     [provider]  Multiple Vitamins-Minerals (PRESERVISION AREDS 2+MULTI VIT PO) Take 1 tablet by mouth 2 (two) times daily.    [provider]  nitroGLYCERIN (NITROSTAT) 0.4 MG SL tablet Place 1 tablet (0.4 mg total) under the tongue every 5 (five) minutes as needed (up to three doses, if pain continues call 911). 06/27/15   Nahser, Wonda Cheng, MD  pantoprazole (PROTONIX) 40 MG tablet TAKE 1 TABLET BY MOUTH DAILY. Patient taking differently: TAKE 40mg  BY MOUTH DAILY. 01/13/16   Collene Gobble, MD  potassium chloride SA (K-DUR,KLOR-CON) 20 MEQ tablet TAKE 1 TABLET BY MOUTH TWICE DAILY Patient taking differently: TAKE 20MeQ BY MOUTH once daily in the morning 06/25/15   Nahser, Wonda Cheng, MD  rosuvastatin (CRESTOR) 10 MG tablet TAKE 1 TABLET BY  MOUTH DAILY. 09/16/17   Nahser, Wonda Cheng, MD  triamterene-hydrochlorothiazide (DYAZIDE) 37.5-25 MG capsule Take 1 each (1 capsule total) by mouth every morning. 04/05/17   Nahser, Wonda Cheng, MD    Physical Exam: Vitals:   09/26/17 0945 09/26/17 1015 09/26/17 1142 09/26/17 1200  BP:    119/71  Pulse: 93 98 (!) 102 (!) 106  Resp: 20 (!) 26 18 (!) 46  Temp:      TempSrc:      SpO2:      Weight:      Height:        Constitutional: NAD, calm, comfortable Vitals:   09/26/17 0945 09/26/17 1015 09/26/17 1142 09/26/17 1200  BP:    119/71  Pulse: 93 98 (!) 102 (!) 106  Resp: 20 (!) 26 18 (!) 46  Temp:      TempSrc:      SpO2:      Weight:      Height:  Eyes: PERRL, lids and conjunctivae normal ENMT: Mucous membranes are moist. Posterior pharynx clear of any exudate or lesions.Normal dentition.  Neck: normal, supple, no masses, no thyromegaly Respiratory: end-expiratory wheezing on exam, mildly increased resp effort Cardiovascular: Regular rate and rhythm, no murmurs / rubs / gallops. No extremity edema. 2+ pedal pulses. No carotid bruits.  Abdomen: no tenderness, no masses palpated. No hepatosplenomegaly. Bowel sounds positive.  Musculoskeletal: no clubbing / cyanosis. No joint deformity upper and lower extremities. Good ROM, no contractures. Normal muscle tone.  Skin: no rashes, lesions, ulcers. No induration Neurologic: CN 2-12 grossly intact. Sensation intact, DTR normal. Strength 5/5 in all 4.  Psychiatric: Normal judgment and insight. Alert and oriented x 3. Normal mood.    Labs on Admission: I have personally reviewed following labs and imaging studies  CBC: Recent Labs  Lab 09/26/17 0939  WBC 7.4  NEUTROABS 5.4  HGB 13.9  HCT 40.3  MCV 85.9  PLT 465   Basic Metabolic Panel: Recent Labs  Lab 09/26/17 0939  NA 134*  K 3.7  CL 97*  CO2 26  GLUCOSE 136*  BUN 16  CREATININE 0.97  CALCIUM 8.9   GFR: Estimated Creatinine Clearance: 70.5 mL/min (by C-G  formula based on SCr of 0.97 mg/dL). Liver Function Tests: Recent Labs  Lab 09/26/17 0939  AST 18  ALT 13*  ALKPHOS 61  BILITOT 0.8  PROT 7.2  ALBUMIN 3.9   No results for input(s): LIPASE, AMYLASE in the last 168 hours. No results for input(s): AMMONIA in the last 168 hours. Coagulation Profile: No results for input(s): INR, PROTIME in the last 168 hours. Cardiac Enzymes: Recent Labs  Lab 09/26/17 0948  TROPONINI <0.03   BNP (last 3 results) No results for input(s): PROBNP in the last 8760 hours. HbA1C: No results for input(s): HGBA1C in the last 72 hours. CBG: No results for input(s): GLUCAP in the last 168 hours. Lipid Profile: No results for input(s): CHOL, HDL, LDLCALC, TRIG, CHOLHDL, LDLDIRECT in the last 72 hours. Thyroid Function Tests: No results for input(s): TSH, T4TOTAL, FREET4, T3FREE, THYROIDAB in the last 72 hours. Anemia Panel: No results for input(s): VITAMINB12, FOLATE, FERRITIN, TIBC, IRON, RETICCTPCT in the last 72 hours. Urine analysis:    Component Value Date/Time   COLORURINE STRAW (A) 07/04/2017 0738   APPEARANCEUR CLEAR 07/04/2017 0738   LABSPEC 1.005 07/04/2017 0738   PHURINE 6.0 07/04/2017 0738   GLUCOSEU NEGATIVE 07/04/2017 0738   HGBUR NEGATIVE 07/04/2017 0738   BILIRUBINUR NEGATIVE 07/04/2017 0738   KETONESUR NEGATIVE 07/04/2017 0738   PROTEINUR NEGATIVE 07/04/2017 0738   NITRITE NEGATIVE 07/04/2017 0738   LEUKOCYTESUR NEGATIVE 07/04/2017 0738   Sepsis Labs: !!!!!!!!!!!!!!!!!!!!!!!!!!!!!!!!!!!!!!!!!!!! @LABRCNTIP (procalcitonin:4,lacticidven:4) )No results found for this or any previous visit (from the past 240 hour(s)).   Radiological Exams on Admission: Dg Chest 2 View  Result Date: 09/26/2017 CLINICAL DATA:  Cough over the last 2 weeks. EXAM: CHEST  2 VIEW COMPARISON:  07/04/2017 FINDINGS: Heart size is normal. Chronic aortic atherosclerosis. There is central bronchial thickening but no evidence of infiltrate, collapse or  effusion. Density overlying the upper spine relates to vertebral body osteophytes. IMPRESSION: Bronchitis pattern.  No consolidation or collapse. Electronically Signed   By: Nelson Chimes M.D.   On: 09/26/2017 10:14    EKG: Independently reviewed. Sinus, Qtc 476  Assessment/Plan Principal Problem:   Acute hypoxemic respiratory failure (HCC) Active Problems:   Hyperlipidemia   Benign essential HTN   Diabetes mellitus, type 2 ([place])  Acute bronchitis   1. Acute hypoxemic respiratory failure 1. Pt is home O2 naive 2. Pt noted to have O2 sat of 89% on room air in ED 3. Cont to wean O2 as tolerated 2. Acute bronchitis 1. Evidence of bronchitis noted on CXR, personally reviewed 2. Pt already treated with zpak x 2 over past 2 weeks. Will start levofloxacin 3. Continue scheduled bronchodilators and IV steroids 4. Afebrile. No leukocytosis 3. HLD 1. Stable at present 2. Cont home meds 4. Sinus tachycardia 1. Suspect related to presenting bronchitis 2. Monitor on tele 5. DM2 1. Cont on SSI coverage, resistant scale given concurrent steroids 2. Random glucose stable 6. HTN 1. BP stable at present 2. Cont home meds as tolerated  DVT prophylaxis: Lovenox subq  Code Status: Full Family Communication: Pt in room  Disposition Plan: Uncertain at this time  Consults called:  Admission status: Inpatient as would likely require greater than 2 midnight stay for IV steroids and O2 weaning   Marylu Lund MD [facility] Pager [phone]  If 7PM-7AM, please contact night-coverage [email or web address] Password [email or web address]  09/26/2017, 1:14 PM

## 2017-09-26 NOTE — ED Notes (Signed)
ED TO INPATIENT HANDOFF REPORT  Name/Age/Gender [name] [name] 82 y.o. male  Code Status    Code Status Orders  (From admission, onward)        Start     Ordered   09/26/17 1344  Full code  Continuous     09/26/17 1343    Code Status History    Date Active Date Inactive Code Status Order ID Comments User Context   This patient has a current code status but no historical code status.      Home/SNF/Other Home  Chief Complaint cold, cough, diarrhea  Level of Care/Admitting Diagnosis ED Disposition    ED Disposition Condition [facility] Area: [place] [[id number]]  Level of Care: Telemetry [5]  Admit to tele based on following criteria: Complex arrhythmia (Bradycardia/Tachycardia)  Diagnosis: Acute hypoxemic respiratory failure [facility]) [[id number]]  Admitting Physician: Velta Addison  Attending Physician: Donne Hazel [[id number]]  Estimated length of stay: 5 - 7 days  Certification:: I certify this patient will need inpatient services for at least 2 midnights  PT Class (Do Not Modify): Inpatient [101]  PT Acc Code (Do Not Modify): Private [1]       Medical History Past Medical History:  Diagnosis Date  . Asthma   . BPH (benign prostatic hypertrophy)   . Diabetes ([place])   . History of orthostatic hypotension   . Hyperlipidemia   . Hypertension   . MI (myocardial infarction) ([place])   . Prostate cancer ([place])   . Rheumatic fever   . Sleep apnea     Allergies Allergies  Allergen Reactions  . Doxycycline Monohydrate Hives  . Penicillins Hives  . Fluticasone-Salmeterol     REACTION: sensation of throat closing REACTION: sensation of throat closing  . Lisinopril Cough  . Prednisone     Per WU:XLKGMWN agitation Per UU:VOZDGUY agitation  . Shark Cartilage   . Tetracyclines & Related Hives  . Tetracycline Rash    IV Location/Drains/Wounds Patient Lines/Drains/Airways Status   Active Line/Drains/Airways    Name:    Placement date:   Placement time:   Site:   Days:   Peripheral IV 09/26/17 Right Hand   09/26/17    0910    Hand   less than 1          Labs/Imaging Results for orders placed or performed during the hospital encounter of 09/26/17 (from the past 48 hour(s))  Comprehensive metabolic panel     Status: Abnormal   Collection Time: 09/26/17  9:39 AM  Result Value Ref Range   Sodium 134 (L) 135 - 145 mmol/L   Potassium 3.7 3.5 - 5.1 mmol/L   Chloride 97 (L) 101 - 111 mmol/L   CO2 26 22 - 32 mmol/L   Glucose, Bld 136 (H) 65 - 99 mg/dL   BUN 16 6 - 20 mg/dL   Creatinine, Ser 0.97 0.61 - 1.24 mg/dL   Calcium 8.9 8.9 - 10.3 mg/dL   Total Protein 7.2 6.5 - 8.1 g/dL   Albumin 3.9 3.5 - 5.0 g/dL   AST 18 15 - 41 U/L   ALT 13 (L) 17 - 63 U/L   Alkaline Phosphatase 61 38 - 126 U/L   Total Bilirubin 0.8 0.3 - 1.2 mg/dL   GFR calc non Af Amer >60 >60 mL/min   GFR calc Af Amer >60 >60 mL/min    Comment: (NOTE) The eGFR has been calculated using the CKD EPI equation. This  calculation has not been validated in all clinical situations. eGFR's persistently <60 mL/min signify possible Chronic Kidney Disease.    Anion gap 11 5 - 15  CBC WITH DIFFERENTIAL     Status: None   Collection Time: 09/26/17  9:39 AM  Result Value Ref Range   WBC 7.4 4.0 - 10.5 K/uL   RBC 4.69 4.22 - 5.81 MIL/uL   Hemoglobin 13.9 13.0 - 17.0 g/dL   HCT 40.3 39.0 - 52.0 %   MCV 85.9 78.0 - 100.0 fL   MCH 29.6 26.0 - 34.0 pg   MCHC 34.5 30.0 - 36.0 g/dL   RDW 14.4 11.5 - 15.5 %   Platelets 243 150 - 400 K/uL   Neutrophils Relative % 74 %   Lymphocytes Relative 16 %   Monocytes Relative 9 %   Eosinophils Relative 1 %   Basophils Relative 0 %   Neutro Abs 5.4 1.7 - 7.7 K/uL   Lymphs Abs 1.2 0.7 - 4.0 K/uL   Monocytes Absolute 0.7 0.1 - 1.0 K/uL   Eosinophils Absolute 0.1 0.0 - 0.7 K/uL   Basophils Absolute 0.0 0.0 - 0.1 K/uL   RBC Morphology POLYCHROMASIA PRESENT    WBC Morphology INCREASED BANDS (>20% BANDS)    Brain natriuretic peptide     Status: None   Collection Time: 09/26/17  9:39 AM  Result Value Ref Range   B Natriuretic Peptide 21.2 0.0 - 100.0 pg/mL  Troponin I     Status: None   Collection Time: 09/26/17  9:48 AM  Result Value Ref Range   Troponin I <0.03 <0.03 ng/mL   Dg Chest 2 View  Result Date: 09/26/2017 CLINICAL DATA:  Cough over the last 2 weeks. EXAM: CHEST  2 VIEW COMPARISON:  07/04/2017 FINDINGS: Heart size is normal. Chronic aortic atherosclerosis. There is central bronchial thickening but no evidence of infiltrate, collapse or effusion. Density overlying the upper spine relates to vertebral body osteophytes. IMPRESSION: Bronchitis pattern.  No consolidation or collapse. Electronically Signed   By: Nelson Chimes M.D.   On: 09/26/2017 10:14    Pending Labs Unresulted Labs (From admission, onward)   Start     Ordered   10/03/17 0500  Creatinine, serum  (enoxaparin (LOVENOX)    CrCl >/= 30 ml/min)  Weekly,   R    Comments:  while on enoxaparin therapy    09/26/17 1343   09/27/17 0500  Comprehensive metabolic panel  Tomorrow morning,   R     09/26/17 1343   09/27/17 0500  CBC  Tomorrow morning,   R     09/26/17 1343   09/26/17 1344  CBC  (enoxaparin (LOVENOX)    CrCl >/= 30 ml/min)  Once,   R    Comments:  Baseline for enoxaparin therapy IF NOT ALREADY DRAWN.  Notify MD if PLT < 100 K.    09/26/17 1343   09/26/17 1344  Creatinine, serum  (enoxaparin (LOVENOX)    CrCl >/= 30 ml/min)  Once,   R    Comments:  Baseline for enoxaparin therapy IF NOT ALREADY DRAWN.    09/26/17 1343   09/26/17 1344  Respiratory Panel by PCR  (Respiratory virus panel)  Once,   R     09/26/17 1343   09/26/17 1049  Influenza panel by PCR (type A & B)  (Influenza PCR Panel)  Once,   R     09/26/17 1048      Vitals/Pain Today's Vitals   09/26/17 1215  09/26/17 1230 09/26/17 1316 09/26/17 1400  BP:  101/64  (!) 106/58  Pulse: 93 93  95  Resp: '20 19  18  ' Temp:      TempSrc:      SpO2:    96%   Weight:      Height:      PainSc:        Isolation Precautions Droplet precaution  Medications Medications  0.9 %  sodium chloride infusion ( Intravenous New Bag/Given 09/26/17 0951)  enoxaparin (LOVENOX) injection 40 mg (not administered)  acetaminophen (TYLENOL) tablet 650 mg (not administered)    Or  acetaminophen (TYLENOL) suppository 650 mg (not administered)  ketorolac (TORADOL) 15 MG/ML injection 15 mg (not administered)  insulin aspart (novoLOG) injection 0-20 Units (not administered)  insulin aspart (novoLOG) injection 0-5 Units (not administered)  methylPREDNISolone sodium succinate (SOLU-MEDROL) 40 mg/mL injection 40 mg (not administered)  ipratropium-albuterol (DUONEB) 0.5-2.5 (3) MG/3ML nebulizer solution 3 mL (3 mLs Nebulization Not Given 09/26/17 1335)  ipratropium-albuterol (DUONEB) 0.5-2.5 (3) MG/3ML nebulizer solution 3 mL (not administered)  amLODipine (NORVASC) tablet 2.5 mg (not administered)  benzonatate (TESSALON) capsule 200 mg (not administered)  bisoprolol (ZEBETA) tablet 5 mg (not administered)  finasteride (PROSCAR) tablet 5 mg (not administered)  latanoprost (XALATAN) 0.005 % ophthalmic solution 1 drop (not administered)  losartan (COZAAR) tablet 100 mg (not administered)  pantoprazole (PROTONIX) EC tablet 40 mg (not administered)  rosuvastatin (CRESTOR) tablet 10 mg (not administered)  triamterene-hydrochlorothiazide (DYAZIDE) 37.5-25 MG per capsule 1 capsule (not administered)  levofloxacin (LEVAQUIN) IVPB 750 mg (not administered)  levofloxacin (LEVAQUIN) IVPB 750 mg (not administered)  dexamethasone (DECADRON) injection 10 mg (10 mg Intravenous Given 09/26/17 0951)  albuterol (PROVENTIL) (2.5 MG/3ML) 0.083% nebulizer solution 5 mg (5 mg Nebulization Given 09/26/17 1314)    Mobility

## 2017-09-26 NOTE — ED Notes (Signed)
He has successfully ambulated to our b.r. And back and tells [place] he was able to void and had a "normal" b.m.

## 2017-09-26 NOTE — ED Triage Notes (Signed)
He c/o URI sx since Thanksgiving. He has been prescribed steroids and a Z-pack by his pcp with no particular improvement of symptoms. He also had one diarrhea stool this morning. He arrives here in no distress. His wife is with him.

## 2017-09-26 NOTE — Progress Notes (Signed)
Pharmacy Antibiotic Note  [name] [name] is a 82 y.o. male admitted on 09/26/2017 with acute hypoxemic respiratory failure/acute bronchitis (treated with azithromycin as outpatient).  Pharmacy has been consulted for Levofloxacin dosing.  Plan: Levofloxacin 750mg  IV q24h. Monitor renal function, culture results as available, clinical course.   Height: 5\' 10"  (177.8 cm) Weight: 211 lb (95.7 kg) IBW/kg (Calculated) : 73  Temp (24hrs), Avg:99.4 F (37.4 C), Min:99.4 F (37.4 C), Max:99.4 F (37.4 C)  Recent Labs  Lab 09/26/17 0939  WBC 7.4  CREATININE 0.97    Estimated Creatinine Clearance: 70.5 mL/min (by C-G formula based on SCr of 0.97 mg/dL).    Allergies  Allergen Reactions  . Doxycycline Monohydrate Hives  . Penicillins Hives  . Fluticasone-Salmeterol     REACTION: sensation of throat closing REACTION: sensation of throat closing  . Lisinopril Cough  . Prednisone     Per HY:WVPXTGG agitation Per YI:RSWNIOE agitation  . Shark Cartilage   . Tetracyclines & Related Hives  . Tetracycline Rash    Antimicrobials this admission: 12/9 >> Levofloxacin >>  Dose adjustments this admission: --  Microbiology results: Influenza panel by PCR: in process Respiratory panel by PCR: ordered  Thank you for allowing pharmacy to be a part of this patient's care.   Lindell Spar, PharmD, BCPS Pager: [phone] 09/26/2017 2:17 PM

## 2017-09-26 NOTE — ED Provider Notes (Signed)
[facility] DEPT Provider Note   CSN: [id number] Arrival date & time: 09/26/17  2202     History   Chief Complaint Chief Complaint  Patient presents with  . Weakness    HPI KYMARI [name] is a 82 y.o. male.  HPI  Patient presents with concern of weakness, cough, generalized discomfort. He is here with his wife who assists with the HPI. Acknowledges multiple medical issues including CAD, but no history of congestive heart failure. Over the past 3 weeks or so he has had persistent cough, worsening energy level, and today, felt particularly weak in his upper back and neck. No syncope, no falling, no focal chest pain, abdominal pain, headache. No relief in spite of multiple different medication trials, including steroids. Patient is a Research scientist (medical).  When he notes that he had a CAT scan of his neck within the past few days given his sensation of dyspnea. Results unavailable so far.   Past Medical History:  Diagnosis Date  . Asthma   . BPH (benign prostatic hypertrophy)   . Diabetes ([place])   . History of orthostatic hypotension   . Hyperlipidemia   . Hypertension   . MI (myocardial infarction) ([place])   . Prostate cancer ([place])   . Rheumatic fever   . Sleep apnea     Patient Active Problem List   Diagnosis Date Noted  . Syncope 06/01/2017  . Asthma without status asthmaticus 09/21/2016  . Asthma attack 09/21/2016  . Diabetes mellitus, type 2 ([place]) 09/21/2016  . Acid reflux 09/21/2016  . Hypercholesterolemia without hypertriglyceridemia 09/21/2016  . Personal history of prostate cancer 06/18/2016  . Felon of finger of right hand 04/06/2016  . Paronychia of left middle finger 04/06/2016  . OSA (obstructive sleep apnea) 05/10/2015  . Bladder outlet obstruction 03/22/2013  . Right bundle branch block 03/17/2013  . Incomplete emptying of bladder 09/22/2012  . ED (erectile dysfunction) of organic origin 09/21/2012  . Leg  weakness 04/13/2011  . Musculoskeletal symptoms referable to limbs 04/13/2011  . CA of prostate ([place]) 01/02/2009  . Cough 11/20/2008  . Hyperlipidemia 11/15/2008  . Benign essential HTN 11/15/2008  . Acute myocardial infarction ([place]) 11/15/2008  . ALLERGIC RHINITIS 11/15/2008  . Chronic airway obstruction ([place]) 11/14/2008    Past Surgical History:  Procedure Laterality Date  . APPENDECTOMY  1954  . CATARACT EXTRACTION Left 03/25/2017  . HERNIA REPAIR    . INCISION AND DRAINAGE ABSCESS Right 04/02/2016   Procedure: INCISION AND DRAINAGE ABSCESS;  Surgeon: Leanora Cover, MD;  Location: [facility];  Service: Orthopedics;  Laterality: Right;  Marland Kitchen VASECTOMY         Home Medications    Prior to Admission medications   Medication Sig Start Date End Date Taking? Authorizing Provider  acetaminophen (TYLENOL) 500 MG tablet Take 500 mg by mouth 2 (two) times daily as needed.    [provider]  albuterol (PROAIR HFA) 108 (90 BASE) MCG/ACT inhaler Inhale 2 puffs into the lungs every 6 (six) hours as needed for wheezing. 07/06/13   Collene Gobble, MD  amLODipine (NORVASC) 2.5 MG tablet Take 1 tablet (2.5 mg total) by mouth daily. 09/23/17 12/22/17  Nahser, Wonda Cheng, MD  benzonatate (TESSALON) 200 MG capsule Take 200 mg by mouth 3 (three) times daily as needed for cough.    [provider]  bisoprolol (ZEBETA) 5 MG tablet Take 1 tablet (5 mg total) by mouth daily. 09/23/17   Nahser, Wonda Cheng,  MD  cetirizine (ZYRTEC) 10 MG tablet Take 10 mg by mouth daily.    [provider]  cholecalciferol (VITAMIN D) 1000 units tablet Take 1,000 Units by mouth daily.    [provider]  famotidine (PEPCID) 20 MG tablet Take 20 mg by mouth daily.     [provider]  finasteride (PROSCAR) 5 MG tablet Take 5 mg by mouth daily.      [provider]  fluticasone (FLONASE) 50 MCG/ACT nasal spray Place 2 sprays into the nose daily.  02/22/13   [provider]  latanoprost (XALATAN) 0.005 % ophthalmic solution Place 1 drop into both eyes daily. 08/07/16   [provider]  losartan (COZAAR) 100 MG tablet Take 1 tablet (100 mg total) by mouth daily. 04/05/17   Nahser, Wonda Cheng, MD  magnesium oxide (MAG-OX) 400 MG tablet Take 400 mg by mouth daily.    [provider]  metFORMIN (GLUCOPHAGE) 500 MG tablet Take 1,000 mg by mouth at bedtime.     [provider]  Multiple Vitamins-Minerals (PRESERVISION AREDS 2+MULTI VIT PO) Take 1 tablet by mouth 2 (two) times daily.    [provider]  nitroGLYCERIN (NITROSTAT) 0.4 MG SL tablet Place 1 tablet (0.4 mg total) under the tongue every 5 (five) minutes as needed (up to three doses, if pain continues call 911). 06/27/15   Nahser, Wonda Cheng, MD  pantoprazole (PROTONIX) 40 MG tablet TAKE 1 TABLET BY MOUTH DAILY. Patient taking differently: TAKE 40mg  BY MOUTH DAILY. 01/13/16   Collene Gobble, MD  potassium chloride SA (K-DUR,KLOR-CON) 20 MEQ tablet TAKE 1 TABLET BY MOUTH TWICE DAILY Patient taking differently: TAKE 20MeQ BY MOUTH once daily in the morning 06/25/15   Nahser, Wonda Cheng, MD  rosuvastatin (CRESTOR) 10 MG tablet TAKE 1 TABLET BY MOUTH DAILY. 09/16/17   Nahser, Wonda Cheng, MD  triamterene-hydrochlorothiazide (DYAZIDE) 37.5-25 MG capsule Take 1 each (1 capsule total) by mouth every morning. 04/05/17   Nahser, Wonda Cheng, MD    Family History Family History  Problem Relation Age of Onset  . Heart failure Mother   . Arthritis Other   . Diabetes Other   . Heart disease Other   . Hyperlipidemia Other   . Kidney disease Other   . Obesity Other     Social History Social History   Tobacco Use  . Smoking status: Former Smoker    Packs/day: 1.00    Years: 30.00    Pack years: 30.00    Types: Cigarettes, Pipe    Last attempt to quit: 10/19/2012    Years since quitting: 4.9  . Smokeless tobacco: Never Used  . Tobacco comment: 1 pipe every 3 days, quit smoking cig  12 yrs ago 07/06/13  Substance Use Topics  . Alcohol use: No    Comment: no etoh in 12 years  . Drug use: No     Allergies   Doxycycline monohydrate; Penicillins; Fluticasone-salmeterol; Lisinopril; Prednisone; Shark cartilage; Tetracyclines & related; and Tetracycline   Review of Systems Review of Systems  Constitutional:       Per HPI, otherwise negative  HENT:       Per HPI, otherwise negative  Respiratory:       Per HPI, otherwise negative  Cardiovascular:       Per HPI, otherwise negative  Gastrointestinal: Negative for vomiting.  Endocrine:       Negative aside from HPI  Genitourinary:       Neg aside from HPI  Musculoskeletal:       Per HPI, otherwise negative  Skin: Negative.   Neurological: Positive for weakness. Negative for syncope.     Physical Exam Updated Vital Signs BP 94/64 (BP Location: Left Arm)   Pulse (!) 116   Temp 99.4 F (37.4 C) (Oral)   Resp (!) 21   Ht 5\' 10"  (1.778 m)   Wt 95.7 kg (211 lb)   SpO2 97%   BMI 30.28 kg/m   Physical Exam  Constitutional: He is oriented to person, place, and time. He appears well-developed. No distress.  HENT:  Head: Normocephalic and atraumatic.  Eyes: Conjunctivae and EOM are normal.  Cardiovascular: Normal rate and regular rhythm.  Pulmonary/Chest: Effort normal. No stridor. No respiratory distress.  Abdominal: He exhibits no distension.  Musculoskeletal: He exhibits no edema.  Neurological: He is alert and oriented to person, place, and time.  Skin: Skin is warm and dry.  Psychiatric: He has a normal mood and affect.  Nursing note and vitals reviewed.    ED Treatments / Results  Labs (all labs ordered are listed, but only abnormal results are displayed) Labs Reviewed  COMPREHENSIVE METABOLIC PANEL  CBC WITH DIFFERENTIAL/PLATELET  BRAIN NATRIURETIC PEPTIDE  TROPONIN I    EKG  EKG Interpretation None       Radiology Dg Chest 2 View  Result Date: 09/26/2017 CLINICAL DATA:  Cough  over the last 2 weeks. EXAM: CHEST  2 VIEW COMPARISON:  07/04/2017 FINDINGS: Heart size is normal. Chronic aortic atherosclerosis. There is central bronchial thickening but no evidence of infiltrate, collapse or effusion. Density overlying the upper spine relates to vertebral body osteophytes. IMPRESSION: Bronchitis pattern.  No consolidation or collapse. Electronically Signed   By: Nelson Chimes M.D.   On: 09/26/2017 10:14    Procedures Procedures (including critical care time)  Medications Ordered in ED Medications  0.9 %  sodium chloride infusion ( Intravenous New Bag/Given 09/26/17 0951)  dexamethasone (DECADRON) injection 10 mg (10 mg Intravenous Given 09/26/17 0951)     Initial Impression / Assessment and Plan / ED Course  I have reviewed the triage vital signs and the nursing notes.  Pertinent labs & imaging results that were available during my care of the patient were reviewed by me and considered in my medical decision making (see chart for details).  12:57 PM Patient 89% on room air. I had a lengthy conversation with the patient's family members and him about today's findings including no evidence for pneumonia, but suspicion for COPD exacerbation. Patient does not carry this formal diagnosis, but given his history of asthma, as well as long smoking history, there is suspicion. Given the patient's hypoxia on room air, patient will continue to receive breathing treatments, has received steroids, will be admitted for likely COPD exacerbation/hypoxia.   Final Clinical Impressions(s) / ED Diagnoses  Shortness of breath   Carmin Muskrat, MD 09/26/17 1258

## 2017-09-26 NOTE — ED Notes (Signed)
Bed: PQ33 Expected date:  Expected time:  Means of arrival:  Comments: 82 yo cold symptoms

## 2017-09-27 DIAGNOSIS — J9601 Acute respiratory failure with hypoxia: Secondary | ICD-10-CM | POA: Diagnosis not present

## 2017-09-27 DIAGNOSIS — I10 Essential (primary) hypertension: Secondary | ICD-10-CM | POA: Diagnosis not present

## 2017-09-27 DIAGNOSIS — J209 Acute bronchitis, unspecified: Secondary | ICD-10-CM

## 2017-09-27 DIAGNOSIS — E782 Mixed hyperlipidemia: Secondary | ICD-10-CM | POA: Diagnosis not present

## 2017-09-27 LAB — GLUCOSE, CAPILLARY
Glucose-Capillary: 173 mg/dL — ABNORMAL HIGH (ref 65–99)
Glucose-Capillary: 134 mg/dL — ABNORMAL HIGH (ref 65–99)

## 2017-09-27 LAB — COMPREHENSIVE METABOLIC PANEL
ALT: 13 U/L — ABNORMAL LOW (ref 17–63)
AST: 19 U/L (ref 15–41)
Albumin: 3.2 g/dL — ABNORMAL LOW (ref 3.5–5.0)
Alkaline Phosphatase: 49 U/L (ref 38–126)
Anion gap: 10 (ref 5–15)
BUN: 21 mg/dL — ABNORMAL HIGH (ref 6–20)
CO2: 23 mmol/L (ref 22–32)
Calcium: 8.3 mg/dL — ABNORMAL LOW (ref 8.9–10.3)
Chloride: 102 mmol/L (ref 101–111)
Creatinine, Ser: 0.84 mg/dL (ref 0.61–1.24)
GFR calc Af Amer: >60 mL/min (ref >60)
GFR calc non Af Amer: >60 mL/min (ref >60)
Glucose, Bld: 166 mg/dL — ABNORMAL HIGH (ref 65–99)
Potassium: 3.6 mmol/L (ref 3.5–5.1)
Sodium: 135 mmol/L (ref 135–145)
Total Bilirubin: 0.7 mg/dL (ref 0.3–1.2)
Total Protein: 6.1 g/dL — ABNORMAL LOW (ref 6.5–8.1)

## 2017-09-27 LAB — RESPIRATORY PANEL BY PCR
Adenovirus: NOT DETECTED
Bordetella pertussis: NOT DETECTED
Chlamydophila pneumoniae: NOT DETECTED
Coronavirus 229E: NOT DETECTED
Coronavirus HKU1: NOT DETECTED
Coronavirus NL63: NOT DETECTED
Coronavirus OC43: NOT DETECTED
Influenza A H1 2009: DETECTED — AB
Influenza B: NOT DETECTED
Metapneumovirus: NOT DETECTED
Mycoplasma pneumoniae: NOT DETECTED
Parainfluenza Virus 1: NOT DETECTED
Parainfluenza Virus 2: NOT DETECTED
Parainfluenza Virus 3: NOT DETECTED
Parainfluenza Virus 4: NOT DETECTED
Respiratory Syncytial Virus: NOT DETECTED
Rhinovirus / Enterovirus: NOT DETECTED

## 2017-09-27 LAB — CBC
HCT: 34.8 % — ABNORMAL LOW (ref 39.0–52.0)
Hemoglobin: 11.9 g/dL — ABNORMAL LOW (ref 13.0–17.0)
MCH: 29.2 pg (ref 26.0–34.0)
MCHC: 34.2 g/dL (ref 30.0–36.0)
MCV: 85.3 fL (ref 78.0–100.0)
Platelets: 223 10*3/uL (ref 150–400)
RBC: 4.08 MIL/uL — ABNORMAL LOW (ref 4.22–5.81)
RDW: 14.3 % (ref 11.5–15.5)
WBC: 4.4 10*3/uL (ref 4.0–10.5)

## 2017-09-27 MED ORDER — SODIUM CHLORIDE 0.9 % IV BOLUS (SEPSIS)
500.0000 mL | Freq: Once | INTRAVENOUS | Status: AC
  Administered 2017-09-27: 500 mL via INTRAVENOUS

## 2017-09-27 MED ORDER — AZITHROMYCIN 250 MG PO TABS: 250.0000 mg | ORAL_TABLET | Freq: Every day | ORAL | Status: DC

## 2017-09-27 MED ORDER — OSELTAMIVIR PHOSPHATE 75 MG PO CAPS: 75.0000 mg | ORAL_CAPSULE | Freq: Two times a day (BID) | ORAL | 0 refills | Status: DC

## 2017-09-27 MED ORDER — BENZONATATE 200 MG PO CAPS: 200.0000 mg | ORAL_CAPSULE | Freq: Three times a day (TID) | ORAL | 0 refills | Status: DC | PRN

## 2017-09-27 MED ORDER — AZITHROMYCIN 250 MG PO TABS: 250.0000 mg | ORAL_TABLET | Freq: Every day | ORAL | 0 refills | Status: DC

## 2017-09-27 NOTE — Progress Notes (Signed)
Went over discharge paperwork with patient.  Pt wanted to wait and take Zithromax when he gets home.  Prescriptions given to patient, explained  When and how to take medications.  VSS.  Pt walked out with RN.

## 2017-09-27 NOTE — Care Management Obs Status (Signed)
[facility] NOTIFICATION   Patient Details  Name: [name] [name] MRN: [id number] Date of Birth: 07-27-1935   Medicare Observation Status Notification Given:  Yes    MahabirJuliann Pulse, RN 09/27/2017, 1:00 PM

## 2017-09-27 NOTE — Care Management CC44 (Signed)
Condition Code 44 Documentation Completed  Patient Details  Name: [name] [name] MRN: [id number] Date of Birth: Jan 27, 1935   Condition Code 44 given:  Yes Patient signature on Condition Code 44 notice:  Yes Documentation of 2 MD's agreement:  Yes Code 44 added to claim:  Yes    Dessa Phi, RN 09/27/2017, 1:01 PM

## 2017-09-27 NOTE — Care Management Note (Signed)
Case Management Note  Patient Details  Name: [name] [name] MRN: [id number] Date of Birth: 1935-08-05  Subjective/Objective: 82 y/o m admitted w/Acute hypoxemic resp failure. From home.                   Action/Plan:d/c home.   Expected Discharge Date:  09/27/17               Expected Discharge Plan:  Home/Self Care  In-House Referral:     Discharge planning Services  CM Consult  Post Acute Care Choice:    Choice offered to:     DME Arranged:    DME Agency:     HH Arranged:    HH Agency:     Status of Service:  Completed, signed off  If discussed at [employer] of Stay Meetings, dates discussed:    Additional Comments:  Dessa Phi, RN 09/27/2017, 12:10 PM

## 2017-10-02 NOTE — Discharge Summary (Signed)
Physician Discharge Summary  [name] [name] SEG:[id number] DOB: October 20, 1934 DOA: 09/26/2017  PCP: Leeroy Cha, MD  Admit date: 09/26/2017 Discharge date: 09/27/2017  Admitted From: Home.  Disposition:  Home.   Recommendations for Outpatient Follow-up:  1. Follow up with PCP in 1-2 weeks 2. Please obtain BMP/CBC in one week  Discharge Condition:stable.  CODE STATUS: full code.  Diet recommendation: Heart Healthy   Brief/Interim Summary: [name] [name] is a 82 y.o. male with medical history significant of asthma, BPH, DM2 on metformin, CAD presents to ED with increased SOB, cough    Discharge Diagnoses:  Principal Problem:   Acute hypoxemic respiratory failure ([place]) Active Problems:   Hyperlipidemia   Benign essential HTN   Diabetes mellitus, type 2 ([place])   Acute bronchitis  Acute hypoxemic respiratory failure: secondary to  Influenza A bronchitis.  Weaned off oxygen.  Complete the course of zithromax.  Continue with bronchodilators.  D/c steroids.   stable on discharge.   Hyperlipidemia:  Resume home meds.   Diabetes Mellitus:  Resume home meds.   Hypertension;  Well controlled.    Discharge Instructions  Discharge Instructions    Diet - low sodium heart healthy   Complete by:  As directed    Discharge instructions   Complete by:  As directed    Please follow up with PCP in one week.     Allergies as of 09/27/2017      Reactions   Doxycycline Monohydrate Hives   Penicillins Hives   Fluticasone-salmeterol    REACTION: sensation of throat closing REACTION: sensation of throat closing   Lisinopril Cough   Prednisone    Per VP:XTGGYIR agitation Per SW:NIOEVOJ agitation   Shark Cartilage    Tetracyclines & Related Hives   Tetracycline Rash      Medication List    STOP taking these medications   cetirizine 10 MG tablet Commonly known as:  ZYRTEC     TAKE these medications   albuterol 0.63 MG/3ML nebulizer solution Commonly known as:   ACCUNEB Take 1 ampule by nebulization every 6 (six) hours as needed for wheezing or shortness of breath.   albuterol 108 (90 Base) MCG/ACT inhaler Commonly known as:  PROAIR HFA Inhale 2 puffs into the lungs every 6 (six) hours as needed for wheezing.   amLODipine 2.5 MG tablet Commonly known as:  NORVASC Take 1 tablet (2.5 mg total) by mouth daily. What changed:  Another medication with the same name was removed. Continue taking this medication, and follow the directions you see here.   azithromycin 250 MG tablet Commonly known as:  ZITHROMAX Take 1 tablet (250 mg total) by mouth daily.   benzonatate 200 MG capsule Commonly known as:  TESSALON Take 1 capsule (200 mg total) by mouth 3 (three) times daily as needed for cough.   bisoprolol 5 MG tablet Commonly known as:  ZEBETA Take 1 tablet (5 mg total) by mouth daily.   chlorpheniramine 4 MG tablet Commonly known as:  CHLOR-TRIMETON Take 4 mg by mouth 2 (two) times daily.   cholecalciferol 1000 units tablet Commonly known as:  VITAMIN D Take 1,000 Units by mouth daily.   Dexlansoprazole 30 MG capsule Take 30 mg by mouth daily.   famotidine 20 MG tablet Commonly known as:  PEPCID Take 20 mg by mouth daily.   finasteride 5 MG tablet Commonly known as:  PROSCAR Take 5 mg by mouth daily.   fluticasone 50 MCG/ACT nasal spray Commonly known as:  [place]  2 sprays into the nose daily as needed for allergies.   latanoprost 0.005 % ophthalmic solution Commonly known as:  XALATAN Place 1 drop into both eyes daily.   losartan 50 MG tablet Commonly known as:  COZAAR Take 50 mg by mouth daily.   magnesium oxide 400 MG tablet Commonly known as:  MAG-OX Take 400 mg by mouth daily.   metFORMIN 500 MG tablet Commonly known as:  GLUCOPHAGE Take 1,000 mg by mouth at bedtime.   nitroGLYCERIN 0.4 MG SL tablet Commonly known as:  NITROSTAT Place 1 tablet (0.4 mg total) under the tongue every 5 (five) minutes as needed  (up to three doses, if pain continues call 911).   oseltamivir 75 MG capsule Commonly known as:  TAMIFLU Take 1 capsule (75 mg total) by mouth 2 (two) times daily.   pantoprazole 40 MG tablet Commonly known as:  PROTONIX TAKE 1 TABLET BY MOUTH DAILY. What changed:    how much to take  how to take this  when to take this   potassium chloride SA 20 MEQ tablet Commonly known as:  K-DUR,KLOR-CON TAKE 1 TABLET BY MOUTH TWICE DAILY   PRESERVISION AREDS 2+MULTI VIT PO Take 1 tablet by mouth 2 (two) times daily.   rosuvastatin 10 MG tablet Commonly known as:  CRESTOR TAKE 1 TABLET BY MOUTH DAILY.   traZODone 50 MG tablet Commonly known as:  DESYREL Take 50 mg by mouth at bedtime.   triamterene-hydrochlorothiazide 37.5-25 MG capsule Commonly known as:  DYAZIDE Take 1 each (1 capsule total) by mouth every morning.   TYLENOL 500 MG tablet Generic drug:  acetaminophen Take 500 mg by mouth 2 (two) times daily as needed for moderate pain.      Follow-up Information    Leeroy Cha, MD. Schedule an appointment as soon as possible for a visit in 1 week(s).   Specialty:  Internal Medicine Contact information: [address] [place] [address] [phone]          Allergies  Allergen Reactions  . Doxycycline Monohydrate Hives  . Penicillins Hives  . Fluticasone-Salmeterol     REACTION: sensation of throat closing REACTION: sensation of throat closing  . Lisinopril Cough  . Prednisone     Per SN:[id number] agitation Per HA:LPFXTKW agitation  . Shark Cartilage   . Tetracyclines & Related Hives  . Tetracycline Rash    Consultations:  None.    Procedures/Studies: Dg Chest 2 View  Result Date: 09/26/2017 CLINICAL DATA:  Cough over the last 2 weeks. EXAM: CHEST  2 VIEW COMPARISON:  07/04/2017 FINDINGS: Heart size is normal. Chronic aortic atherosclerosis. There is central bronchial thickening but no evidence of infiltrate, collapse or  effusion. Density overlying the upper spine relates to vertebral body osteophytes. IMPRESSION: Bronchitis pattern.  No consolidation or collapse. Electronically Signed   By: Nelson Chimes M.D.   On: 09/26/2017 10:14       Subjective: Feeling much better.   Discharge Exam: Vitals:   09/27/17 0623 09/27/17 0632  BP: 125/73 125/75  Pulse:  93  Resp:    Temp:    SpO2:     Vitals:   09/27/17 0349 09/27/17 0504 09/27/17 0623 09/27/17 0632  BP: 96/67 (!) 86/54 125/73 125/75  Pulse: (!) 103 94  93  Resp: 20     Temp: 98 F (36.7 C)     TempSrc: Oral     SpO2: 95%     Weight:  Height:        General: Pt is alert, awake, not in acute distress Cardiovascular: RRR, S1/S2 +, no rubs, no gallops Respiratory: CTA bilaterally, no wheezing, no rhonchi Abdominal: Soft, NT, ND, bowel sounds + Extremities: no edema, no cyanosis    The results of significant diagnostics from this hospitalization (including imaging, microbiology, ancillary and laboratory) are listed below for reference.     Microbiology: Recent Results (from the past 240 hour(s))  Respiratory Panel by PCR     Status: Abnormal   Collection Time: 09/26/17  1:44 PM  Result Value Ref Range Status   Adenovirus NOT DETECTED NOT DETECTED Final   Coronavirus 229E NOT DETECTED NOT DETECTED Final   Coronavirus HKU1 NOT DETECTED NOT DETECTED Final   Coronavirus NL63 NOT DETECTED NOT DETECTED Final   Coronavirus OC43 NOT DETECTED NOT DETECTED Final   Metapneumovirus NOT DETECTED NOT DETECTED Final   Rhinovirus / Enterovirus NOT DETECTED NOT DETECTED Final   Influenza A H1 2009 DETECTED (A) NOT DETECTED Final   Influenza B NOT DETECTED NOT DETECTED Final   Parainfluenza Virus 1 NOT DETECTED NOT DETECTED Final   Parainfluenza Virus 2 NOT DETECTED NOT DETECTED Final   Parainfluenza Virus 3 NOT DETECTED NOT DETECTED Final   Parainfluenza Virus 4 NOT DETECTED NOT DETECTED Final   Respiratory Syncytial Virus NOT DETECTED NOT  DETECTED Final   Bordetella pertussis NOT DETECTED NOT DETECTED Final   Chlamydophila pneumoniae NOT DETECTED NOT DETECTED Final   Mycoplasma pneumoniae NOT DETECTED NOT DETECTED Final    Comment: Performed at [facility], [place] [address], [place], [place] [address]     Labs: BNP (last 3 results) Recent Labs    09/26/17 0939  BNP 84.6   Basic Metabolic Panel: Recent Labs  Lab 09/26/17 0939 09/27/17 0415  NA 134* 135  K 3.7 3.6  CL 97* 102  CO2 26 23  GLUCOSE 136* 166*  BUN 16 21*  CREATININE 0.97 0.84  CALCIUM 8.9 8.3*   Liver Function Tests: Recent Labs  Lab 09/26/17 0939 09/27/17 0415  AST 18 19  ALT 13* 13*  ALKPHOS 61 49  BILITOT 0.8 0.7  PROT 7.2 6.1*  ALBUMIN 3.9 3.2*   No results for input(s): LIPASE, AMYLASE in the last 168 hours. No results for input(s): AMMONIA in the last 168 hours. CBC: Recent Labs  Lab 09/26/17 0939 09/27/17 0415  WBC 7.4 4.4  NEUTROABS 5.4  --   HGB 13.9 11.9*  HCT 40.3 34.8*  MCV 85.9 85.3  PLT 243 223   Cardiac Enzymes: Recent Labs  Lab 09/26/17 0948  TROPONINI <0.03   BNP: Invalid input(s): POCBNP CBG: Recent Labs  Lab 09/26/17 1654 09/26/17 2126 09/27/17 0744 09/27/17 1230  GLUCAP 238* 203* 173* 134*   D-Dimer No results for input(s): DDIMER in the last 72 hours. Hgb A1c No results for input(s): HGBA1C in the last 72 hours. Lipid Profile No results for input(s): CHOL, HDL, LDLCALC, TRIG, CHOLHDL, LDLDIRECT in the last 72 hours. Thyroid function studies No results for input(s): TSH, T4TOTAL, T3FREE, THYROIDAB in the last 72 hours.  Invalid input(s): FREET3 Anemia work up No results for input(s): VITAMINB12, FOLATE, FERRITIN, TIBC, IRON, RETICCTPCT in the last 72 hours. Urinalysis    Component Value Date/Time   COLORURINE STRAW (A) 07/04/2017 0738   APPEARANCEUR CLEAR 07/04/2017 0738   LABSPEC 1.005 07/04/2017 0738   PHURINE 6.0 07/04/2017 0738   GLUCOSEU NEGATIVE 07/04/2017 0738   HGBUR  NEGATIVE  07/04/2017 [place] 07/04/2017 0738   KETONESUR NEGATIVE 07/04/2017 0738   PROTEINUR NEGATIVE 07/04/2017 0738   NITRITE NEGATIVE 07/04/2017 0738   LEUKOCYTESUR NEGATIVE 07/04/2017 0738   Sepsis Labs Invalid input(s): PROCALCITONIN,  WBC,  LACTICIDVEN Microbiology Recent Results (from the past 240 hour(s))  Respiratory Panel by PCR     Status: Abnormal   Collection Time: 09/26/17  1:44 PM  Result Value Ref Range Status   Adenovirus NOT DETECTED NOT DETECTED Final   Coronavirus 229E NOT DETECTED NOT DETECTED Final   Coronavirus HKU1 NOT DETECTED NOT DETECTED Final   Coronavirus NL63 NOT DETECTED NOT DETECTED Final   Coronavirus OC43 NOT DETECTED NOT DETECTED Final   Metapneumovirus NOT DETECTED NOT DETECTED Final   Rhinovirus / Enterovirus NOT DETECTED NOT DETECTED Final   Influenza A H1 2009 DETECTED (A) NOT DETECTED Final   Influenza B NOT DETECTED NOT DETECTED Final   Parainfluenza Virus 1 NOT DETECTED NOT DETECTED Final   Parainfluenza Virus 2 NOT DETECTED NOT DETECTED Final   Parainfluenza Virus 3 NOT DETECTED NOT DETECTED Final   Parainfluenza Virus 4 NOT DETECTED NOT DETECTED Final   Respiratory Syncytial Virus NOT DETECTED NOT DETECTED Final   Bordetella pertussis NOT DETECTED NOT DETECTED Final   Chlamydophila pneumoniae NOT DETECTED NOT DETECTED Final   Mycoplasma pneumoniae NOT DETECTED NOT DETECTED Final    Comment: Performed at [facility], [place] [address] [place], [place] [address]     Time coordinating discharge: Over 30 minutes  SIGNED:   Hosie Poisson, MD  [facility] 10/02/2017, 11:40 PM Pager   If 7PM-7AM, please contact night-coverage [email or web address] Password [email or web address]

## 2017-10-04 ENCOUNTER — Telehealth: Payer: Self-pay | Admitting: Cardiovascular Disease

## 2017-10-04 NOTE — Telephone Encounter (Signed)
New message     Patient wife is call about husband, pt is bring a CT disc from the [place], and the patient was in the hospital last week, PCP is thinking that the problem is with his heart.     Per wife patient is having trouble swallowing and coughing a lot, with congestion  Pt c/o Shortness Of Breath: STAT if SOB developed within the last 24 hours or pt is noticeably SOB on the phone  1. Are you currently SOB (can you hear that pt is SOB on the phone)?  Wife   2. How long have you been experiencing SOB?  Awhile   3. Are you SOB when sitting or when up moving around?  Both    4. Are you currently experiencing any other symptoms? Having problems with swallowing as well

## 2017-10-04 NOTE — Telephone Encounter (Signed)
Agree with note from Michelle Swinyer, RN  

## 2017-10-04 NOTE — Telephone Encounter (Signed)
Spoke with patient's wife who states patient was hospitalized with the flu on 12/9. She states he has since followed-up with his PCP and she advised that he call us to make an appointment. She states he has a lot of chest congestion and cough but in addition has been having SOB with exertion in recent weeks. He is scheduled for an echo tomorrow at our office. I do not see a recent cath report or if there is any known CAD. I scheduled patient to see Dr. Acie Fredrickson on 12/19. Wife states they are going to bring a CD with a scan from the [facility] hospital. She thanked me for the call.

## 2017-10-05 ENCOUNTER — Other Ambulatory Visit: Payer: Self-pay

## 2017-10-05 ENCOUNTER — Ambulatory Visit (HOSPITAL_COMMUNITY): Payer: Medicare Other | Attending: Cardiology

## 2017-10-05 DIAGNOSIS — J45909 Unspecified asthma, uncomplicated: Secondary | ICD-10-CM | POA: Diagnosis not present

## 2017-10-05 DIAGNOSIS — I340 Nonrheumatic mitral (valve) insufficiency: Secondary | ICD-10-CM | POA: Insufficient documentation

## 2017-10-05 DIAGNOSIS — I10 Essential (primary) hypertension: Secondary | ICD-10-CM | POA: Insufficient documentation

## 2017-10-05 DIAGNOSIS — E119 Type 2 diabetes mellitus without complications: Secondary | ICD-10-CM | POA: Diagnosis not present

## 2017-10-05 DIAGNOSIS — R9389 Abnormal findings on diagnostic imaging of other specified body structures: Secondary | ICD-10-CM | POA: Insufficient documentation

## 2017-10-05 NOTE — Progress Notes (Unsigned)
This encounter was created in error - please disregard.

## 2017-10-05 NOTE — Telephone Encounter (Signed)
Reviewed echo results with patient. He complains of a funny feeling in his head when he lays down states he thinks it might be vertigo. He denies angina or DOE. I advised him to keep his March appointment and call back between now and then with questions or concerns. I cancelled his appointment for tomorrow, 12/19 with Dr. Acie Fredrickson. He verbalized understanding and agreement with plan and thanked me for the call.

## 2017-10-06 ENCOUNTER — Ambulatory Visit: Payer: Medicare Other | Admitting: Cardiovascular Disease

## 2017-10-19 ENCOUNTER — Emergency Department (HOSPITAL_COMMUNITY): Payer: Medicare Other

## 2017-10-19 ENCOUNTER — Emergency Department (HOSPITAL_COMMUNITY)
Admission: EM | Admit: 2017-10-19 | Discharge: 2017-10-19 | Disposition: A | Discharge status: Home / Self Care | Payer: Medicare Other | Attending: Emergency Medicine | Admitting: Emergency Medicine

## 2017-10-19 ENCOUNTER — Other Ambulatory Visit: Payer: Self-pay

## 2017-10-19 ENCOUNTER — Encounter (HOSPITAL_COMMUNITY): Payer: Self-pay | Admitting: Nurse Practitioner

## 2017-10-19 DIAGNOSIS — R0600 Dyspnea, unspecified: Secondary | ICD-10-CM | POA: Diagnosis present

## 2017-10-19 DIAGNOSIS — Z7984 Long term (current) use of oral hypoglycemic drugs: Secondary | ICD-10-CM | POA: Insufficient documentation

## 2017-10-19 DIAGNOSIS — Z87891 Personal history of nicotine dependence: Secondary | ICD-10-CM | POA: Insufficient documentation

## 2017-10-19 DIAGNOSIS — Z79899 Other long term (current) drug therapy: Secondary | ICD-10-CM | POA: Diagnosis not present

## 2017-10-19 DIAGNOSIS — I10 Essential (primary) hypertension: Secondary | ICD-10-CM | POA: Insufficient documentation

## 2017-10-19 DIAGNOSIS — J45909 Unspecified asthma, uncomplicated: Secondary | ICD-10-CM | POA: Diagnosis not present

## 2017-10-19 DIAGNOSIS — E119 Type 2 diabetes mellitus without complications: Secondary | ICD-10-CM | POA: Diagnosis not present

## 2017-10-19 LAB — I-STAT CHEM 8, ED
BUN: 15 mg/dL (ref 6–20)
Calcium, Ion: 1.17 mmol/L (ref 1.15–1.40)
Chloride: 99 mmol/L — ABNORMAL LOW (ref 101–111)
Creatinine, Ser: 0.6 mg/dL — ABNORMAL LOW (ref 0.61–1.24)
Glucose, Bld: 129 mg/dL — ABNORMAL HIGH (ref 65–99)
HCT: 39 % (ref 39.0–52.0)
Hemoglobin: 13.3 g/dL (ref 13.0–17.0)
Potassium: 3.2 mmol/L — ABNORMAL LOW (ref 3.5–5.1)
Sodium: 139 mmol/L (ref 135–145)
TCO2: 24 mmol/L (ref 22–32)

## 2017-10-19 LAB — I-STAT TROPONIN, ED: Troponin i, poc: 0 ng/mL (ref 0.00–0.08)

## 2017-10-19 MED ORDER — POTASSIUM CHLORIDE CRYS ER 20 MEQ PO TBCR
40.0000 meq | EXTENDED_RELEASE_TABLET | Freq: Once | ORAL | Status: AC
  Administered 2017-10-19: 40 meq via ORAL
  Filled 2017-10-19: qty 2
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
## 2017-10-19 NOTE — ED Notes (Signed)
Bed: WA03 Expected date:  Expected time:  Means of arrival:  Comments: ems

## 2017-10-19 NOTE — Discharge Instructions (Signed)
Call Dr.Varadaran tomorrow to schedule follow-up appointment.  Tell office staff that you were seen here today.  Return if concern for any reason
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
## 2017-10-22 ENCOUNTER — Other Ambulatory Visit: Payer: Self-pay | Admitting: Neurosurgery

## 2017-10-22 DIAGNOSIS — S065XAA Traumatic subdural hemorrhage with loss of consciousness status unknown, initial encounter: Secondary | ICD-10-CM

## 2017-10-22 DIAGNOSIS — S065X9A Traumatic subdural hemorrhage with loss of consciousness of unspecified duration, initial encounter: Secondary | ICD-10-CM

## 2017-10-28 ENCOUNTER — Ambulatory Visit
Admission: RE | Admit: 2017-10-28 | Discharge: 2017-10-28 | Disposition: A | Discharge status: Home / Self Care | Payer: Medicare Other | Source: Ambulatory Visit | Attending: Neurosurgery | Admitting: Neurosurgery

## 2017-10-28 DIAGNOSIS — S065X9A Traumatic subdural hemorrhage with loss of consciousness of unspecified duration, initial encounter: Secondary | ICD-10-CM

## 2017-10-28 DIAGNOSIS — S065XAA Traumatic subdural hemorrhage with loss of consciousness status unknown, initial encounter: Secondary | ICD-10-CM

## 2017-11-11 ENCOUNTER — Other Ambulatory Visit: Payer: Self-pay

## 2017-11-11 DIAGNOSIS — I10 Essential (primary) hypertension: Secondary | ICD-10-CM

## 2017-11-11 MED ORDER — NITROGLYCERIN 0.4 MG SL SUBL: 0.4000 mg | SUBLINGUAL_TABLET | SUBLINGUAL | 11 refills | Status: DC | PRN

## 2017-12-29 ENCOUNTER — Ambulatory Visit: Payer: Medicare Other | Admitting: Cardiovascular Disease

## 2018-02-07 ENCOUNTER — Other Ambulatory Visit: Payer: Self-pay

## 2018-02-07 ENCOUNTER — Telehealth: Payer: Self-pay | Admitting: Cardiovascular Disease

## 2018-02-07 DIAGNOSIS — I10 Essential (primary) hypertension: Secondary | ICD-10-CM

## 2018-02-07 MED ORDER — NITROGLYCERIN 0.4 MG SL SUBL: 0.4000 mg | SUBLINGUAL_TABLET | SUBLINGUAL | 11 refills | Status: DC | PRN

## 2018-02-07 NOTE — Telephone Encounter (Signed)
RX SENT TO [facility]

## 2018-02-07 NOTE — Telephone Encounter (Signed)
Pt's wife calling to get refill of Nitro. Pt had chest pain yesterday and couldn't find his Nitro anywhere. Needs 30 day supply called to [facility]

## 2018-02-07 NOTE — Telephone Encounter (Signed)
clld pt to advise he has 11 refills at [facility] (which is listed as preferred pharmacy in chart) and drug can be refilled there. Pt's wife answered (on his HIPPA)  and advised that she could not get to [place] [facility] to fill any script! She requested that [facility] [address]. Writer of this complied. BN, CMA

## 2018-03-09 ENCOUNTER — Ambulatory Visit: Payer: Medicare Other | Admitting: Cardiovascular Disease

## 2018-03-09 ENCOUNTER — Encounter: Payer: Self-pay | Admitting: Cardiovascular Disease

## 2018-03-09 ENCOUNTER — Encounter (INDEPENDENT_AMBULATORY_CARE_PROVIDER_SITE_OTHER): Payer: Self-pay

## 2018-03-09 VITALS — BP 100/62 | HR 64 | Ht 70.0 in | Wt 204.0 lb

## 2018-03-09 DIAGNOSIS — I10 Essential (primary) hypertension: Secondary | ICD-10-CM | POA: Diagnosis not present

## 2018-03-09 DIAGNOSIS — R6889 Other general symptoms and signs: Secondary | ICD-10-CM | POA: Diagnosis not present

## 2018-03-09 DIAGNOSIS — I952 Hypotension due to drugs: Secondary | ICD-10-CM | POA: Diagnosis not present

## 2018-03-09 DIAGNOSIS — E782 Mixed hyperlipidemia: Secondary | ICD-10-CM

## 2018-03-09 DIAGNOSIS — R5383 Other fatigue: Secondary | ICD-10-CM

## 2018-03-09 NOTE — Patient Instructions (Signed)
Medication Instructions:  Your physician has recommended you make the following change in your medication: STOP Amlodipine ( Norvasc)   Labwork: Your physician recommends that you have labwork today: Bmet, TSH,, liver, lipid   Testing/Procedures: None Ordered  Follow-Up: Your physician wants you to follow-up in: 6 months with a PA or NP on Dr. Elmarie Shiley team. You will receive a reminder letter in the mail two months in advance. If you don't receive a letter, please call our office to schedule the follow-up appointment.   Any Other Special Instructions Will Be Listed Below (If Applicable).     If you need a refill on your cardiac medications before your next appointment, please call your pharmacy. 2
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
## 2018-03-10 ENCOUNTER — Telehealth: Payer: Self-pay | Admitting: Cardiovascular Disease

## 2018-03-10 LAB — LIPID PANEL
Chol/HDL Ratio: 3.1 ratio (ref 0.0–5.0)
Cholesterol, Total: 141 mg/dL (ref 100–199)
HDL: 46 mg/dL (ref >39)
LDL Calculated: 72 mg/dL (ref 0–99)
Triglycerides: 113 mg/dL (ref 0–149)
VLDL Cholesterol Cal: 23 mg/dL (ref 5–40)

## 2018-03-10 LAB — BASIC METABOLIC PANEL
BUN/Creatinine Ratio: 17 (ref 10–24)
BUN: 15 mg/dL (ref 8–27)
CO2: 24 mmol/L (ref 20–29)
Calcium: 9.2 mg/dL (ref 8.6–10.2)
Chloride: 100 mmol/L (ref 96–106)
Creatinine, Ser: 0.9 mg/dL (ref 0.76–1.27)
GFR calc Af Amer: 92 mL/min/{1.73_m2} (ref >59)
GFR calc non Af Amer: 80 mL/min/{1.73_m2} (ref >59)
Glucose: 100 mg/dL — ABNORMAL HIGH (ref 65–99)
Potassium: 4.5 mmol/L (ref 3.5–5.2)
Sodium: 142 mmol/L (ref 134–144)

## 2018-03-10 LAB — TSH: TSH: 1.1 u[IU]/mL (ref 0.450–4.500)

## 2018-03-10 LAB — HEPATIC FUNCTION PANEL
ALT: 10 IU/L (ref 0–44)
AST: 11 IU/L (ref 0–40)
Albumin: 4.3 g/dL (ref 3.5–4.7)
Alkaline Phosphatase: 71 IU/L (ref 39–117)
Bilirubin Total: 0.5 mg/dL (ref 0.0–1.2)
Bilirubin, Direct: 0.15 mg/dL (ref 0.00–0.40)
Total Protein: 6.9 g/dL (ref 6.0–8.5)

## 2018-03-10 NOTE — Telephone Encounter (Signed)
New Message:       Pt c/o BP issue: STAT if pt c/o blurred vision, one-sided weakness or slurred speech  1. What are your last 5 BP readings? 100/62:yesterday 141/86:today  2. Are you having any other symptoms (ex. Dizziness, headache, blurred vision, passed out)? No  3. What is your BP issue? Pt states he was taken off a medication at yesterday's appt and is now calling to give an update on bp today

## 2018-03-10 NOTE — Telephone Encounter (Signed)
Spoke with wife, DPR on file.  Pt took AM meds between 0730-1000.  Unsure of what time each particular medication was taken. BP this AM is 141/86 (taken at 1030).  Amlodipine 2.5mg  d/c'ed yesterday at appt d/t low BP and c/o fatigue.  Advised wife to have pt continue monitoring BP. Told her this should be done about 2 hours after BP medications (Dyazide, Losartan).  Told her if further recommendations, would call back, otherwise have him monitor BP and call next week with readings if they continue to be elevated.  Wife verbalized understanding.

## 2018-03-15 NOTE — Telephone Encounter (Signed)
Continue to monitor / record. Will want to  review several weeks of BP readings

## 2018-03-28 ENCOUNTER — Other Ambulatory Visit: Payer: Self-pay | Admitting: Cardiovascular Disease

## 2018-04-27 ENCOUNTER — Ambulatory Visit: Payer: Medicare Other | Admitting: Podiatry

## 2018-05-11 ENCOUNTER — Ambulatory Visit: Payer: Medicare Other | Admitting: Podiatry

## 2018-05-11 ENCOUNTER — Encounter: Payer: Self-pay | Admitting: Podiatry

## 2018-05-11 DIAGNOSIS — E1159 Type 2 diabetes mellitus with other circulatory complications: Secondary | ICD-10-CM | POA: Diagnosis not present

## 2018-05-11 DIAGNOSIS — M79674 Pain in right toe(s): Secondary | ICD-10-CM | POA: Diagnosis not present

## 2018-05-11 DIAGNOSIS — B351 Tinea unguium: Secondary | ICD-10-CM | POA: Diagnosis not present

## 2018-05-11 DIAGNOSIS — M79675 Pain in left toe(s): Secondary | ICD-10-CM

## 2018-05-11 NOTE — Progress Notes (Signed)
Complaint:  Visit Type: Patient returns to my office for continued preventative foot care services. Complaint: Patient states" my nails have grown long and thick and become painful to walk and wear shoes" Patient has been diagnosed with DM with no foot complications. The patient presents for preventative foot care services. No changes to ROS  Podiatric Exam: Vascular: dorsalis pedis and posterior tibial pulses are weakly  palpable bilateral. Capillary return is immediate. Temperature gradient is WNL. Skin turgor WNL  Sensorium: Normal Semmes Weinstein monofilament test. Normal tactile sensation bilaterally. Nail Exam: Pt has thick disfigured discolored nails with subungual debris noted bilateral entire nail hallux  toenails Ulcer Exam: There is no evidence of ulcer or pre-ulcerative changes or infection. Orthopedic Exam: Muscle tone and strength are WNL. No limitations in general ROM. No crepitus or effusions noted. Foot type and digits show no abnormalities. Bony prominences are unremarkable. Skin: No Porokeratosis. No infection or ulcers  Diagnosis:  Onychomycosis, , Pain in right toe, pain in left toes  Treatment & Plan Procedures and Treatment: Consent by patient was obtained for treatment procedures.   Debridement of mycotic and hypertrophic toenails, 1 through 5 bilateral and clearing of subungual debris. No ulceration, no infection noted.  Return Visit-Office Procedure: Patient instructed to return to the office for a follow up visit 3 months for continued evaluation and treatment.    Gardiner Barefoot DPM

## 2018-06-05 ENCOUNTER — Other Ambulatory Visit: Payer: Self-pay | Admitting: Cardiovascular Disease

## 2018-06-08 ENCOUNTER — Other Ambulatory Visit: Payer: Self-pay | Admitting: Cardiovascular Disease

## 2018-06-10 NOTE — Telephone Encounter (Signed)
Dose: 100 mg Route: Oral Frequency: Daily  Dispense Quantity: 90 tablet Refills: 3 Fills remaining: --        Sig: Take 1 tablet (100 mg total) by mouth daily.  Patient not taking: Reported on 09/26/2017       Discontinue Date:  09/26/2017 19:22 Discontinue User:  Isaac Laud I, CPhT Discontinue Reason:  Discontinued by provider  Written Date: 04/05/17 Expiration Date: 04/05/18    Start Date: 04/05/17 End Date: 09/26/17         Ordering Provider: Thayer Headings, MD DEA #:  [id number] NPI:  [id number]   Authorizing Provider: Thayer Headings, MD DEA #:  [id number] NPI:  [id number]   Ordering User:  Emmaline Life, RN            Original Order:  losartan (COZAAR) 100 MG tablet [[id number]]    Pharmacy:  [facility] [address], [place]

## 2018-06-13 ENCOUNTER — Telehealth: Payer: Self-pay | Admitting: Cardiovascular Disease

## 2018-06-13 ENCOUNTER — Other Ambulatory Visit: Payer: Self-pay | Admitting: Cardiovascular Disease

## 2018-06-13 NOTE — Telephone Encounter (Signed)
Per Dr. Acie Fredrickson, may refill Losartan 100 mg until next office visit

## 2018-06-13 NOTE — Telephone Encounter (Signed)
Since last dose Dr. Acie Fredrickson has documented on the patient is Losartan 50 mg, I believe the prescribing MD needs to be contacted

## 2018-06-13 NOTE — Telephone Encounter (Signed)
Please advise on refill request as current med list and last office visit has losartan 50 mg qd but pharmacy is requesting 100 mg qd. Per patients wife, he has been taking 100 mg qd and she verified that this was the dose on the bottle. Thanks, MI

## 2018-06-13 NOTE — Telephone Encounter (Signed)
Patients wife called back. I just spoke with her and she stated that she was going to call pcp to request refill but she is calling again to request that Dr Acie Fredrickson refill. Please advise. Thanks, MI

## 2018-06-13 NOTE — Telephone Encounter (Signed)
Spoke with patients wife and she states that she isn't sure who would have changed the dose but it may have been his pcp. She will call pcp to discuss and call us back if she has any further questions or concerns.

## 2018-06-13 NOTE — Telephone Encounter (Signed)
° °  Spouse calling to request refill.    1. Which medications need to be refilled? (please list name of each medication and dose if known) Losartan  2. Which pharmacy/location (including street and city if local pharmacy) is medication to be sent to? [facility] [address]  3. Do they need a 30 day or 90 day supply? [place]

## 2018-06-15 ENCOUNTER — Telehealth: Payer: Self-pay | Admitting: Cardiovascular Disease

## 2018-06-15 ENCOUNTER — Other Ambulatory Visit: Payer: Self-pay | Admitting: Cardiovascular Disease

## 2018-06-15 MED ORDER — LOSARTAN POTASSIUM 50 MG PO TABS: 100.0000 mg | ORAL_TABLET | Freq: Every day | ORAL | 0 refills | Status: DC

## 2018-06-15 MED ORDER — LOSARTAN POTASSIUM 50 MG PO TABS: 50.0000 mg | ORAL_TABLET | Freq: Every day | ORAL | 2 refills | Status: DC

## 2018-06-15 NOTE — Telephone Encounter (Signed)
New Message          *STAT* If patient is at the pharmacy, call can be transferred to refill team.   1. Which medications need to be refilled? (please list name of each medication and dose if known) Losartan 100 mg  2. Which pharmacy/location (including street and city if local pharmacy) is medication to be sent to? [facility] [address]  3. Do they need a 30 day or 90 day supply? [place]

## 2018-06-15 NOTE — Telephone Encounter (Signed)
Pt's medication was sent to pt's pharmacy as requested. Confirmation received.  °

## 2018-06-15 NOTE — Addendum Note (Signed)
Addended by: Derl Barrow on: 06/15/2018 11:09 AM   Modules accepted: Orders

## 2018-06-15 NOTE — Telephone Encounter (Signed)
Pt's medication was resent to pt's pharmacy as requested. Per Emmaline Life, RN       06/13/18 6:09 PM  Note    Per Dr. Acie Fredrickson, may refill Losartan 100 mg until next office visit     Pt's medication was sent to pt's pharmacy, enough medication until next appt with Richardson Dopp, PA on 09/06/18. Confirmation received.

## 2018-07-25 ENCOUNTER — Ambulatory Visit (INDEPENDENT_AMBULATORY_CARE_PROVIDER_SITE_OTHER): Payer: Medicare Other | Admitting: Podiatry

## 2018-07-25 ENCOUNTER — Other Ambulatory Visit: Payer: Self-pay | Admitting: Podiatry

## 2018-07-25 ENCOUNTER — Ambulatory Visit (INDEPENDENT_AMBULATORY_CARE_PROVIDER_SITE_OTHER): Payer: Medicare Other

## 2018-07-25 ENCOUNTER — Encounter: Payer: Self-pay | Admitting: Podiatry

## 2018-07-25 DIAGNOSIS — M779 Enthesopathy, unspecified: Secondary | ICD-10-CM | POA: Diagnosis not present

## 2018-07-25 DIAGNOSIS — M79672 Pain in left foot: Secondary | ICD-10-CM

## 2018-07-26 NOTE — Progress Notes (Signed)
Subjective:   Patient ID: [name] [name], male   DOB: 83 y.o.   MRN: [id number]   HPI Patient presents just concerned about some discoloration of his lower legs with a family history of phlebitis with no systemic indications currently   ROS      Objective:  Physical Exam  There is some mild redness in the lower legs bilateral which is very low-grade and may be related to just some point, dermatitis or possibly low-grade phlebitis with negative Homans sign noted and no indications of other pathology from that standpoint     Assessment:  Appears to be a low level irritation with no other pathology     Plan:  He will utilize soaks therapy along with compression and if any swelling in his legs were to occur or other pathology let us know immediately but so far does not show any indications of any clot or any other pathology from systemic perspective

## 2018-08-10 ENCOUNTER — Ambulatory Visit: Payer: Medicare Other | Admitting: Podiatry

## 2018-08-11 ENCOUNTER — Emergency Department (HOSPITAL_COMMUNITY)
Admission: EM | Admit: 2018-08-11 | Discharge: 2018-08-12 | Disposition: A | Discharge status: Home / Self Care | Payer: Medicare Other | Attending: Emergency Medicine | Admitting: Emergency Medicine

## 2018-08-11 ENCOUNTER — Other Ambulatory Visit: Payer: Self-pay

## 2018-08-11 ENCOUNTER — Encounter (HOSPITAL_COMMUNITY): Payer: Self-pay

## 2018-08-11 ENCOUNTER — Emergency Department (HOSPITAL_COMMUNITY): Payer: Medicare Other

## 2018-08-11 DIAGNOSIS — Z79899 Other long term (current) drug therapy: Secondary | ICD-10-CM | POA: Insufficient documentation

## 2018-08-11 DIAGNOSIS — Z8546 Personal history of malignant neoplasm of prostate: Secondary | ICD-10-CM | POA: Insufficient documentation

## 2018-08-11 DIAGNOSIS — I10 Essential (primary) hypertension: Secondary | ICD-10-CM | POA: Insufficient documentation

## 2018-08-11 DIAGNOSIS — I252 Old myocardial infarction: Secondary | ICD-10-CM | POA: Diagnosis not present

## 2018-08-11 DIAGNOSIS — R270 Ataxia, unspecified: Secondary | ICD-10-CM | POA: Insufficient documentation

## 2018-08-11 DIAGNOSIS — Z7984 Long term (current) use of oral hypoglycemic drugs: Secondary | ICD-10-CM | POA: Diagnosis not present

## 2018-08-11 DIAGNOSIS — E119 Type 2 diabetes mellitus without complications: Secondary | ICD-10-CM | POA: Insufficient documentation

## 2018-08-11 DIAGNOSIS — Z7982 Long term (current) use of aspirin: Secondary | ICD-10-CM | POA: Insufficient documentation

## 2018-08-11 DIAGNOSIS — E785 Hyperlipidemia, unspecified: Secondary | ICD-10-CM | POA: Diagnosis not present

## 2018-08-11 DIAGNOSIS — R471 Dysarthria and anarthria: Secondary | ICD-10-CM | POA: Diagnosis not present

## 2018-08-11 DIAGNOSIS — J45909 Unspecified asthma, uncomplicated: Secondary | ICD-10-CM | POA: Insufficient documentation

## 2018-08-11 DIAGNOSIS — Z87891 Personal history of nicotine dependence: Secondary | ICD-10-CM | POA: Insufficient documentation

## 2018-08-11 DIAGNOSIS — R4781 Slurred speech: Secondary | ICD-10-CM | POA: Diagnosis present

## 2018-08-11 LAB — COMPREHENSIVE METABOLIC PANEL
ALT: 9 U/L (ref 0–44)
AST: 16 U/L (ref 15–41)
Albumin: 4 g/dL (ref 3.5–5.0)
Alkaline Phosphatase: 61 U/L (ref 38–126)
Anion gap: 9 (ref 5–15)
BUN: 18 mg/dL (ref 8–23)
CO2: 24 mmol/L (ref 22–32)
Calcium: 9.2 mg/dL (ref 8.9–10.3)
Chloride: 102 mmol/L (ref 98–111)
Creatinine, Ser: 0.95 mg/dL (ref 0.61–1.24)
GFR calc Af Amer: >60 mL/min (ref >60)
GFR calc non Af Amer: >60 mL/min (ref >60)
Glucose, Bld: 107 mg/dL — ABNORMAL HIGH (ref 70–99)
Potassium: 3.9 mmol/L (ref 3.5–5.1)
Sodium: 135 mmol/L (ref 135–145)
Total Bilirubin: 0.8 mg/dL (ref 0.3–1.2)
Total Protein: 7.1 g/dL (ref 6.5–8.1)

## 2018-08-11 LAB — CBC
HCT: 42.2 % (ref 39.0–52.0)
Hemoglobin: 13.4 g/dL (ref 13.0–17.0)
MCH: 28.1 pg (ref 26.0–34.0)
MCHC: 31.8 g/dL (ref 30.0–36.0)
MCV: 88.5 fL (ref 80.0–100.0)
Platelets: 275 10*3/uL (ref 150–400)
RBC: 4.77 MIL/uL (ref 4.22–5.81)
RDW: 13.2 % (ref 11.5–15.5)
WBC: 6.7 10*3/uL (ref 4.0–10.5)
nRBC: 0 % (ref 0.0–0.2)

## 2018-08-11 LAB — DIFFERENTIAL
Abs Immature Granulocytes: 0.05 10*3/uL (ref 0.00–0.07)
Basophils Absolute: 0.1 10*3/uL (ref 0.0–0.1)
Basophils Relative: 2 %
Eosinophils Absolute: 0.3 10*3/uL (ref 0.0–0.5)
Eosinophils Relative: 5 %
Immature Granulocytes: 1 %
Lymphocytes Relative: 27 %
Lymphs Abs: 1.8 10*3/uL (ref 0.7–4.0)
Monocytes Absolute: 0.6 10*3/uL (ref 0.1–1.0)
Monocytes Relative: 9 %
Neutro Abs: 3.9 10*3/uL (ref 1.7–7.7)
Neutrophils Relative %: 56 %

## 2018-08-11 LAB — I-STAT TROPONIN, ED: Troponin i, poc: 0 ng/mL (ref 0.00–0.08)

## 2018-08-11 LAB — PROTIME-INR
INR: 1.04
Prothrombin Time: 13.5 seconds (ref 11.4–15.2)

## 2018-08-11 LAB — APTT: aPTT: 34 seconds (ref 24–36)

## 2018-08-11 MED ORDER — LEVETIRACETAM 500 MG PO TABS
500.0000 mg | ORAL_TABLET | Freq: Once | ORAL | Status: AC
  Administered 2018-08-12: 500 mg via ORAL
  Filled 2018-08-11: qty 1

## 2018-08-11 NOTE — Discharge Instructions (Addendum)
You were seen in the emergency department for slurred speech that has been intermittent for the past month.  Our neurologist recommends close follow-up with an outpatient neurologist for further evaluation.  Head CT, labs were normal today.  No sign of head bleed on CT scan.  Our neurologist did recommend that we restart your Keppra 500 mg twice daily.  If you develop symptoms of severe headache, numbness or weakness on one side of your body, changes in your vision or speech, please return to the hospital.

## 2018-08-11 NOTE — ED Provider Notes (Signed)
CHIEF COMPLAINT: Slurred speech  HPI: Patient is an 83 year old male with history of hypertension, hyperlipidemia, diabetes, previous MI on aspirin, history of subdural hematoma followed by Dr. Trenton Gammon who presents to the emergency department with 1 month of intermittent slurred speech, talking slow and difficulty with word finding.  States they saw their primary care physician at the [place] today and mentioned these findings who recommended close follow-up with patient's neurosurgeon.  They have contacted the neurosurgeons office today who instructed that they come to the emergency department for head CT.  Speech is currently normal.  No vision changes, numbness or weakness.  No chest pain or shortness of breath.  No new head injury.  Was previously on seizure medication but was taken off of this.  ROS: See HPI Constitutional: no fever  Eyes: no drainage  ENT: no runny nose   Cardiovascular:  no chest pain  Resp: no SOB  GI: no vomiting GU: no dysuria Integumentary: no rash  Allergy: no hives  Musculoskeletal: no leg swelling  Neurological: no slurred speech ROS otherwise negative  PAST MEDICAL HISTORY/PAST SURGICAL HISTORY:  Past Medical History:  Diagnosis Date  . Asthma   . BPH (benign prostatic hypertrophy)   . Diabetes ([place])   . History of orthostatic hypotension   . Hyperlipidemia   . Hypertension   . MI (myocardial infarction) ([place])   . Prostate cancer ([place])   . Rheumatic fever   . Sleep apnea     MEDICATIONS:  Prior to Admission medications   Medication Sig Start Date End Date Taking? Authorizing Provider  acetaminophen (TYLENOL) 500 MG tablet Take 500 mg by mouth 2 (two) times daily as needed for moderate pain.     [provider]  albuterol (ACCUNEB) 0.63 MG/3ML nebulizer solution Take 1 ampule by nebulization every 6 (six) hours as needed for wheezing or shortness of breath.    [provider]  albuterol (PROAIR HFA) 108 (90 BASE) MCG/ACT inhaler Inhale  2 puffs into the lungs every 6 (six) hours as needed for wheezing. 07/06/13   Collene Gobble, MD  ALPRAZolam Duanne Moron) 0.25 MG tablet Take 0.125 mg by mouth 2 (two) times daily. 07/05/18   [provider]  ALPRAZolam Duanne Moron) 0.5 MG tablet Take 0.5 tablets by mouth 2 (two) times daily. 03/07/18   [provider]  aspirin EC 81 MG tablet Take 1 tablet by mouth daily.    [provider]  azithromycin (ZITHROMAX) 250 MG tablet Take 1 tablet (250 mg total) by mouth daily. 09/27/17   Hosie Poisson, MD  benzonatate (TESSALON) 200 MG capsule Take 1 capsule (200 mg total) by mouth 3 (three) times daily as needed for cough. 09/27/17   Hosie Poisson, MD  bisoprolol (ZEBETA) 5 MG tablet Take 1 tablet (5 mg total) by mouth daily. 09/23/17   Nahser, Wonda Cheng, MD  cetirizine (ZYRTEC) 10 MG tablet Take 1 tablet by mouth daily.    [provider]  escitalopram (LEXAPRO) 10 MG tablet Take 10 mg by mouth daily. 05/03/18   [provider]  famotidine (PEPCID) 20 MG tablet Take 20 mg by mouth daily.     [provider]  fexofenadine (ALLEGRA ODT) 30 MG disintegrating tablet Take by mouth.    [provider]  finasteride (PROSCAR) 5 MG tablet Take 5 mg by mouth daily.      [provider]  fluticasone (FLONASE) 50 MCG/ACT nasal spray Place 2 sprays into the nose daily as needed for allergies.  02/22/13   [provider]  fluticasone furoate-vilanterol (BREO ELLIPTA) 100-25 MCG/INH AEPB Inhale 1 puff into the lungs daily.    [provider]  glucose blood (TRUETEST TEST) test strip  08/04/13   [provider]  latanoprost (XALATAN) 0.005 % ophthalmic solution Place 1 drop into both eyes daily. 08/07/16   [provider]  losartan (COZAAR) 50 MG tablet Take 2 tablets (100 mg total) by mouth daily. Please keep upcoming appt in November for future refills. Thank you 06/15/18   Nahser, Wonda Cheng, MD  magnesium oxide (MAG-OX) 400  MG tablet Take 400 mg by mouth daily.    [provider]  metFORMIN (GLUCOPHAGE) 500 MG tablet Take 1,000 mg by mouth at bedtime.     [provider]  metFORMIN (GLUCOPHAGE-XR) 500 MG 24 hr tablet  06/06/18   [provider]  Multiple Vitamins-Minerals (PRESERVISION AREDS 2+MULTI VIT PO) Take 1 tablet by mouth 2 (two) times daily.    [provider]  nitroGLYCERIN (NITROSTAT) 0.4 MG SL tablet Place 1 tablet (0.4 mg total) under the tongue every 5 (five) minutes as needed (up to three doses, if pain continues call 911). 02/07/18   Nahser, Wonda Cheng, MD  pantoprazole (PROTONIX) 40 MG tablet TAKE 1 TABLET BY MOUTH DAILY. Patient taking differently: TAKE 40mg  BY MOUTH DAILY. 01/13/16   Collene Gobble, MD  potassium chloride SA (K-DUR,KLOR-CON) 20 MEQ tablet TAKE 1 TABLET BY MOUTH TWICE DAILY 06/25/15   Nahser, Wonda Cheng, MD  rosuvastatin (CRESTOR) 10 MG tablet TAKE ONE TABLET BY MOUTH ONE TIME DAILY  03/28/18   Nahser, Wonda Cheng, MD  Tiotropium Bromide Monohydrate 2.5 MCG/ACT AERS Inhale 1 puff into the lungs daily.    [provider]  triamterene-hydrochlorothiazide (DYAZIDE) 37.5-25 MG capsule take 1 capsule by mouth every morning 03/28/18   Nahser, Wonda Cheng, MD    ALLERGIES:  Allergies  Allergen Reactions  . Doxycycline Monohydrate Hives  . Penicillins Hives    Has patient had a PCN reaction causing immediate rash, facial/tongue/throat swelling, SOB or lightheadedness with hypotension: No Has patient had a PCN reaction causing severe rash involving mucus membranes or skin necrosis: Yes Has patient had a PCN reaction that required hospitalization: No Has patient had a PCN reaction occurring within the last 10 years: No If all of the above answers are "NO", then may proceed with Cephalosporin use.   Marland Kitchen Fluticasone-Salmeterol     REACTION: sensation of throat closing REACTION: sensation of throat closing  . Lisinopril Cough  . Prednisone     Per OY:DXAJOIN  agitation Per OM:VEHMCNO agitation  . Shark Cartilage   . Tetracyclines & Related Hives  . Tetracycline Rash    SOCIAL HISTORY:  Social History   Tobacco Use  . Smoking status: Former Smoker    Packs/day: 1.00    Years: 30.00    Pack years: 30.00    Types: Cigarettes, Pipe    Last attempt to quit: 10/19/2012    Years since quitting: 5.8  . Smokeless tobacco: Never Used  . Tobacco comment: 1 pipe every 3 days, quit smoking cig 12 yrs ago 07/06/13  Substance Use Topics  . Alcohol use: No    Comment: no etoh in 12 years    FAMILY HISTORY: Family History  Problem Relation Age of Onset  . Heart failure Mother   . Arthritis Other   . Diabetes Other   . Heart disease Other   . Hyperlipidemia Other   .  Kidney disease Other   . Obesity Other     EXAM: BP 115/64   Pulse 64   Temp 97.9 F (36.6 C) (Oral)   Resp 18   SpO2 96%  CONSTITUTIONAL: Alert and oriented and responds appropriately to questions. Well-appearing; well-nourished HEAD: Normocephalic EYES: Conjunctivae clear, pupils appear equal, EOMI ENT: normal nose; moist mucous membranes NECK: Supple, no meningismus, no nuchal rigidity, no LAD  CARD: RRR; S1 and S2 appreciated; no murmurs, no clicks, no rubs, no gallops RESP: Normal chest excursion without splinting or tachypnea; breath sounds clear and equal bilaterally; no wheezes, no rhonchi, no rales, no hypoxia or respiratory distress, speaking full sentences ABD/GI: Normal bowel sounds; non-distended; soft, non-tender, no rebound, no guarding, no peritoneal signs, no hepatosplenomegaly BACK:  The back appears normal and is non-tender to palpation, there is no CVA tenderness EXT: Normal ROM in all joints; non-tender to palpation; no edema; normal capillary refill; no cyanosis, no calf tenderness or swelling    SKIN: Normal color for age and race; warm; no rash NEURO: Moves all extremities equally; Strength 5/5 in all four extremities.  Normal sensation diffusely.  CN  2-12 grossly intact.  No dysmetria to finger to nose testing bilaterally.  Normal speech.  Normal gait. PSYCH: The patient's mood and manner are appropriate. Grooming and personal hygiene are appropriate.  MEDICAL DECISION MAKING: Patient here with intermittent aphasia and dysarthria.  Currently neurologically intact.  Symptoms ongoing for over a month.  Head CT shows no acute abnormality and no bleeding.  I did discuss this case with Dr. Rory Percy with neurology.  Appreciate his help.  He recommends close outpatient follow-up with neurology.  I have placed a consult to go for neurology.  He does not feel that we need further emergent work-up in the ED given symptoms have been ongoing for quite some time.  He does recommend restarting the patient on Keppra 500 mg twice daily.  Will give first dose of this medication in the ED.  Discussed return precautions with patient and family.  They are very comfortable with this plan for outpatient follow-up and would prefer it over admission.  At this time, I do not feel there is any life-threatening condition present. I have reviewed and discussed all results (EKG, imaging, lab, urine as appropriate) and exam findings with patient/family. I have reviewed nursing notes and appropriate previous records.  I feel the patient is safe to be discharged home without further emergent workup and can continue workup as an outpatient as needed. Discussed usual and customary return precautions. Patient/family verbalize understanding and are comfortable with this plan.  Outpatient follow-up has been provided if needed. All questions have been answered.     EKG Interpretation  Date/Time:  Thursday August 11 2018 18:31:18 EDT Ventricular Rate:  67 PR Interval:  238 QRS Duration: 134 QT Interval:  434 QTC Calculation: 458 R Axis:   -69 Text Interpretation:  Normal sinus rhythm RBBB and LAFB Abnormal ECG No significant change since last tracing Confirmed by Maria Coin, Cyril Mourning [phone])  on 08/11/2018 11:07:11 PM          Reba Hulett, Delice Bison, DO 08/12/18 [id number]

## 2018-08-11 NOTE — ED Triage Notes (Signed)
Pt here for slurred speech, delayed responses, and difficulty ambulating.  Pt states he does not feel any different.  Hx of brain injury and followed by neurosurgery MD Pool.  Symptoms started this AM. Before lunch time.  Walking difficulties have been for last 3 days.

## 2018-08-12 MED ORDER — LEVETIRACETAM 500 MG PO TABS: 500.0000 mg | ORAL_TABLET | Freq: Two times a day (BID) | ORAL | 1 refills | Status: DC

## 2018-08-16 ENCOUNTER — Encounter: Payer: Self-pay | Admitting: Podiatry

## 2018-08-16 ENCOUNTER — Telehealth: Payer: Self-pay | Admitting: Neurology

## 2018-08-16 ENCOUNTER — Encounter: Payer: Self-pay | Admitting: Neurology

## 2018-08-16 ENCOUNTER — Ambulatory Visit: Payer: Medicare Other | Admitting: Podiatry

## 2018-08-16 ENCOUNTER — Ambulatory Visit: Payer: Medicare Other | Admitting: Neurology

## 2018-08-16 VITALS — BP 116/72 | HR 65 | Ht 70.0 in | Wt 207.0 lb

## 2018-08-16 DIAGNOSIS — M79675 Pain in left toe(s): Secondary | ICD-10-CM | POA: Diagnosis not present

## 2018-08-16 DIAGNOSIS — M79674 Pain in right toe(s): Secondary | ICD-10-CM

## 2018-08-16 DIAGNOSIS — G3281 Cerebellar ataxia in diseases classified elsewhere: Secondary | ICD-10-CM

## 2018-08-16 DIAGNOSIS — E119 Type 2 diabetes mellitus without complications: Secondary | ICD-10-CM | POA: Diagnosis not present

## 2018-08-16 DIAGNOSIS — B351 Tinea unguium: Secondary | ICD-10-CM

## 2018-08-16 DIAGNOSIS — R4701 Aphasia: Secondary | ICD-10-CM

## 2018-08-16 DIAGNOSIS — S065X9A Traumatic subdural hemorrhage with loss of consciousness of unspecified duration, initial encounter: Secondary | ICD-10-CM

## 2018-08-16 DIAGNOSIS — R269 Unspecified abnormalities of gait and mobility: Secondary | ICD-10-CM | POA: Diagnosis not present

## 2018-08-16 DIAGNOSIS — S065XAA Traumatic subdural hemorrhage with loss of consciousness status unknown, initial encounter: Secondary | ICD-10-CM | POA: Insufficient documentation

## 2018-08-16 MED ORDER — LEVETIRACETAM 750 MG PO TABS: 750.0000 mg | ORAL_TABLET | Freq: Two times a day (BID) | ORAL | 11 refills | Status: DC

## 2018-08-16 NOTE — Progress Notes (Addendum)
Complaint:  Visit Type: Patient returns to my office for continued preventative foot care services. Complaint: Patient states" my nails have grown long and thick and become painful to walk and wear shoes" . The patient presents for preventative foot care services. No changes to ROS  Podiatric Exam: Vascular: dorsalis pedis and posterior tibial pulses are weakly  palpable bilateral. Capillary return is immediate. Temperature gradient is WNL. Skin turgor WNL  Sensorium: Normal Semmes Weinstein monofilament test. Normal tactile sensation bilaterally. Nail Exam: Pt has thick disfigured discolored nails with subungual debris noted bilateral Ulcer Exam: There is no evidence of ulcer or pre-ulcerative changes or infection. Orthopedic Exam: Muscle tone and strength are WNL. No limitations in general ROM. No crepitus or effusions noted. Foot type and digits show no abnormalities. Bony prominences are unremarkable. Skin: No Porokeratosis. No infection or ulcers  Diagnosis:  Onychomycosis, , Pain in right toe, pain in left toes  Treatment & Plan Procedures and Treatment: Consent by patient was obtained for treatment procedures.   Debridement of mycotic and hypertrophic toenails, 1 through 5 bilateral and clearing of subungual debris. No ulceration, no infection noted.  Return Visit-Office Procedure: Patient instructed to return to the office for a follow up visit 4 months for continued evaluation and treatment.    Brody Bonneau DPM 

## 2018-08-16 NOTE — Telephone Encounter (Signed)
[employer] medicare order sent to GI. No auth they will reach out to the pt to schedule.  °
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
## 2018-08-16 NOTE — Telephone Encounter (Signed)
Patient wife is aware of this she has GI phone number of [phone] and to call them if she has not heard in the next 2-3 business days.

## 2018-08-19 ENCOUNTER — Telehealth: Payer: Self-pay | Admitting: Cardiovascular Disease

## 2018-08-19 NOTE — Telephone Encounter (Signed)
New Message:     Pt would like to have all of [facility] medicine transferred to [facility] [phone]). He would like for you to transfer them now so they will already be on file please.

## 2018-08-19 NOTE — Telephone Encounter (Signed)
Spoke with patients wife and made her aware that [facility] can [facility]. [facility] will [facility] and call us back if she has any further questions or concerns.

## 2018-08-19 NOTE — Telephone Encounter (Signed)
°*  STAT* If patient is at the pharmacy, call can be transferred to refill team.   1. Which medications need to be refilled? (please list name of each medication and dose if known)  Needs a new prescription for his Rosuvastatin-changing pharmacy  2. Which pharmacy/location (including street and city if local pharmacy) is medication to be sent to? [facility]- [phone]  3. Do they need a 30 day or 90 day supply? 90 and refills

## 2018-08-22 ENCOUNTER — Encounter (INDEPENDENT_AMBULATORY_CARE_PROVIDER_SITE_OTHER): Payer: Medicare Other | Admitting: Ophthalmology

## 2018-08-28 ENCOUNTER — Other Ambulatory Visit: Payer: Medicare Other

## 2018-08-30 ENCOUNTER — Ambulatory Visit: Payer: Medicare Other | Admitting: Neurology

## 2018-08-30 DIAGNOSIS — G3281 Cerebellar ataxia in diseases classified elsewhere: Secondary | ICD-10-CM

## 2018-08-30 DIAGNOSIS — R269 Unspecified abnormalities of gait and mobility: Secondary | ICD-10-CM

## 2018-08-30 DIAGNOSIS — R4701 Aphasia: Secondary | ICD-10-CM | POA: Diagnosis not present

## 2018-08-30 DIAGNOSIS — S065X9A Traumatic subdural hemorrhage with loss of consciousness of unspecified duration, initial encounter: Secondary | ICD-10-CM

## 2018-08-30 DIAGNOSIS — S065XAA Traumatic subdural hemorrhage with loss of consciousness status unknown, initial encounter: Secondary | ICD-10-CM

## 2018-09-02 ENCOUNTER — Ambulatory Visit
Admission: RE | Admit: 2018-09-02 | Discharge: 2018-09-02 | Disposition: A | Discharge status: Home / Self Care | Payer: Medicare Other | Source: Ambulatory Visit | Attending: Neurology | Admitting: Neurology

## 2018-09-02 DIAGNOSIS — R4701 Aphasia: Secondary | ICD-10-CM

## 2018-09-02 DIAGNOSIS — G3281 Cerebellar ataxia in diseases classified elsewhere: Secondary | ICD-10-CM

## 2018-09-05 ENCOUNTER — Telehealth: Payer: Self-pay | Admitting: Neurology

## 2018-09-05 NOTE — Telephone Encounter (Signed)
Please call patient, MRI of the brain showed generalized atrophy, mild supratentorium small vessel disease no acute abnormality.  MRI of cervical spine showed evidence of multilevel degenerative changes, there is no evidence of canal stenosis, spinal cord compression.  IMPRESSION: This MRI of the brain without contrast shows the following: 1.    Moderate generalized cortical atrophy. 2.    Mild chronic microvascular ischemic changes in the hemispheres and pons. 3.    There are no acute findings.   IMPRESSION: This MRI of the cervical spine shows multilevel degenerative changes as detailed above.  The most significant findings are: 1.    The spinal cord appears normal. 2.    At C3-C4, there is borderline spinal stenosis but no nerve root compression. 3.    At C4-C5, there is mild anterolisthesis (3 mm) and degenerative change but no nerve root compression. 4.    At C6-C7, there are degenerative changes causing moderate left foraminal narrowing encroaching on the left C7 nerve root without causing definite nerve root compression.

## 2018-09-05 NOTE — Procedures (Signed)
   HISTORY: 83 years old male, presented with intermittent slurred speech, he had a history of left subdural hematoma, TECHNIQUE:  This is a routine 16 channel EEG recording with one channel devoted to a limited EKG recording.  It was performed during wakefulness, drowsiness and asleep.  Hyperventilation was not performed; and photic stimulation was performed as activating procedures.  There are frequent artifact noted, especially at the area of the tracing  Upon maximum arousal, posterior dominant waking rhythm consistent of alpha rhythm.  Activities are as symmetric over the bilateral posterior derivations and attenuated with eye opening.  Hyperventilation was not performed. Photic stimulation did not alter the tracing.  No sleep was achieved during recording.  There was no epileptiform discharge noted.  EKG demonstrate irregular cardiac rhythm.  CONCLUSION: This is a normal awake yet technically suboptimal EEG.  There is no electrodiagnostic evidence of epileptiform discharge. There is evidence of cardiac arrhythmia.  Marcial Pacas, M.D. Ph.D.  [facility] [place], [place] [address] Phone: [phone] Fax:      [phone]

## 2018-09-05 NOTE — Telephone Encounter (Signed)
Spoke to his wife (on [place]) and the patient - they are both aware of his MRI results below.  He will keep his follow up on 09/20/18 for further review.

## 2018-09-06 ENCOUNTER — Encounter: Payer: Self-pay | Admitting: Physician Assistant

## 2018-09-06 ENCOUNTER — Ambulatory Visit: Payer: Medicare Other | Admitting: Physician Assistant

## 2018-09-06 ENCOUNTER — Other Ambulatory Visit: Payer: Self-pay | Admitting: Cardiovascular Disease

## 2018-09-06 VITALS — BP 110/80 | HR 63 | Ht 70.0 in | Wt 206.0 lb

## 2018-09-06 DIAGNOSIS — E782 Mixed hyperlipidemia: Secondary | ICD-10-CM | POA: Diagnosis not present

## 2018-09-06 DIAGNOSIS — E119 Type 2 diabetes mellitus without complications: Secondary | ICD-10-CM | POA: Diagnosis not present

## 2018-09-06 DIAGNOSIS — I2510 Atherosclerotic heart disease of native coronary artery without angina pectoris: Secondary | ICD-10-CM | POA: Diagnosis not present

## 2018-09-06 DIAGNOSIS — I10 Essential (primary) hypertension: Secondary | ICD-10-CM | POA: Diagnosis not present

## 2018-09-06 DIAGNOSIS — R079 Chest pain, unspecified: Secondary | ICD-10-CM | POA: Diagnosis not present
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
## 2018-09-06 NOTE — Patient Instructions (Signed)
Medication Instructions:  Your physician recommends that you continue on your current medications as directed. Please refer to the Current Medication list given to you today.  If you need a refill on your cardiac medications before your next appointment, please call your pharmacy.   Lab work: NONE If you have labs (blood work) drawn today and your tests are completely normal, you will receive your results only by: Marland Kitchen MyChart Message (if you have MyChart) OR . A paper copy in the mail If you have any lab test that is abnormal or we need to change your treatment, we will call you to review the results.  Testing/Procedures: Your physician has requested that you have a lexiscan myoview. For further information please visit [email or web address]. Please follow instruction sheet, as given.    Follow-Up: At [facility], you and your health needs are our priority.  As part of our continuing mission to provide you with exceptional heart care, we have created designated Provider Care Teams.  These Care Teams include your primary Cardiologist (physician) and Advanced Practice Providers (APPs -  Physician Assistants and Nurse Practitioners) who all work together to provide you with the care you need, when you need it. You will need a follow up appointment in:  6 months.  Please call our office 2 months in advance to schedule this appointment.  You may see Mertie Moores, MD or one of the following Advanced Practice Providers on your designated Care Team: Richardson Dopp, PA-C [place], [place] . Daune Perch, NP  Any Other Special Instructions Will Be Listed Below (If Applicable).

## 2018-09-20 ENCOUNTER — Telehealth (HOSPITAL_COMMUNITY): Payer: Self-pay | Admitting: *Deleted

## 2018-09-20 ENCOUNTER — Encounter: Payer: Self-pay | Admitting: Neurology

## 2018-09-20 ENCOUNTER — Ambulatory Visit: Payer: Medicare Other | Admitting: Neurology

## 2018-09-20 VITALS — BP 130/72 | HR 70 | Ht 70.0 in | Wt 206.0 lb

## 2018-09-20 DIAGNOSIS — G40209 Localization-related (focal) (partial) symptomatic epilepsy and epileptic syndromes with complex partial seizures, not intractable, without status epilepticus: Secondary | ICD-10-CM

## 2018-09-20 DIAGNOSIS — R269 Unspecified abnormalities of gait and mobility: Secondary | ICD-10-CM | POA: Diagnosis not present

## 2018-09-20 DIAGNOSIS — S065XAA Traumatic subdural hemorrhage with loss of consciousness status unknown, initial encounter: Secondary | ICD-10-CM

## 2018-09-20 DIAGNOSIS — S065X9A Traumatic subdural hemorrhage with loss of consciousness of unspecified duration, initial encounter: Secondary | ICD-10-CM | POA: Diagnosis not present

## 2018-09-20 MED ORDER — MEMANTINE HCL 10 MG PO TABS: 10.0000 mg | ORAL_TABLET | Freq: Two times a day (BID) | ORAL | 11 refills | Status: DC

## 2018-09-20 NOTE — Patient Instructions (Signed)
[facility]  Orthotics & Prosthetics Service  [address]  [phone]  

## 2018-09-20 NOTE — Telephone Encounter (Signed)
Attempted to leave a message on voicemail in reference to upcoming appointment scheduled for 09/26/18. No answer and no answering machine. Dalan Cowger, Ranae [place]
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
## 2018-09-22 ENCOUNTER — Telehealth (HOSPITAL_COMMUNITY): Payer: Self-pay | Admitting: *Deleted

## 2018-09-22 NOTE — Telephone Encounter (Signed)
Attempted to call patient regarding upcoming appointment- no answer, unable to leave a message.  [name] [name]  

## 2018-09-26 ENCOUNTER — Ambulatory Visit (HOSPITAL_COMMUNITY): Payer: Medicare Other | Attending: Internal Medicine

## 2018-09-26 ENCOUNTER — Encounter: Payer: Self-pay | Admitting: Physician Assistant

## 2018-09-26 DIAGNOSIS — R079 Chest pain, unspecified: Secondary | ICD-10-CM | POA: Insufficient documentation

## 2018-09-26 LAB — MYOCARDIAL PERFUSION IMAGING
LV dias vol: 80 mL (ref 62–150)
LV sys vol: 34 mL
Peak HR: 67 {beats}/min
Rest HR: 57 {beats}/min
SDS: 1
SRS: 0
SSS: 1
TID: 1.15

## 2018-09-26 MED ORDER — TECHNETIUM TC 99M TETROFOSMIN IV KIT
30.7000 | PACK | Freq: Once | INTRAVENOUS | Status: AC | PRN
  Administered 2018-09-26: 30.7 via INTRAVENOUS
  Filled 2018-09-26: qty 31

## 2018-09-26 MED ORDER — ADENOSINE (DIAGNOSTIC) 3 MG/ML IV SOLN
0.5600 mg/kg | Freq: Once | INTRAVENOUS | Status: AC
  Administered 2018-09-26: 52.2 mg via INTRAVENOUS

## 2018-09-26 MED ORDER — TECHNETIUM TC 99M TETROFOSMIN IV KIT
10.3000 | PACK | Freq: Once | INTRAVENOUS | Status: AC | PRN
  Administered 2018-09-26: 10.3 via INTRAVENOUS
  Filled 2018-09-26: qty 11

## 2018-10-12 ENCOUNTER — Other Ambulatory Visit: Payer: Self-pay | Admitting: Neurology

## 2018-10-29 ENCOUNTER — Other Ambulatory Visit: Payer: Self-pay | Admitting: Cardiovascular Disease

## 2018-11-24 ENCOUNTER — Telehealth: Payer: Self-pay | Admitting: Cardiovascular Disease

## 2018-11-24 NOTE — Telephone Encounter (Signed)
New Message:    Wife would like to know what milligram of Losartan pt is supposed to be taking?

## 2018-11-24 NOTE — Telephone Encounter (Signed)
Spoke with patient's wife, [name], who called to ask about dose of Losartan. She states she has 2 different bottles of Losartan and she thought he was supposed to take 100 mg. I advised that per our chart review, we had documented patient taking Losartan 100 mg QD. She states his BP has been excellent and denies concerns. She states patient will continue Losartan 100 mg and thanked me for the call.

## 2018-11-29 ENCOUNTER — Telehealth: Payer: Self-pay | Admitting: Cardiovascular Disease

## 2018-11-29 NOTE — Telephone Encounter (Signed)
New Message         Patient is calling back for the dosage 41f Losartan, she states after she hung up she forgot what was said.

## 2018-11-29 NOTE — Telephone Encounter (Signed)
Spoke with patient's wife who states she forgot what I told her about the Losartan dose. I advised that per our records, patient has been taking Losartan 100 mg daily. She states he has 25 mg tabs and states he will take 2 tabs in the morning and evening (50 mg BID). I advised that this method is acceptable and she thanked me for my help.

## 2018-11-30 ENCOUNTER — Other Ambulatory Visit: Payer: Self-pay | Admitting: Internal Medicine

## 2018-11-30 DIAGNOSIS — I7300 Raynaud's syndrome without gangrene: Secondary | ICD-10-CM

## 2018-12-02 ENCOUNTER — Ambulatory Visit
Admission: RE | Admit: 2018-12-02 | Discharge: 2018-12-02 | Disposition: A | Discharge status: Home / Self Care | Payer: Medicare Other | Source: Ambulatory Visit | Attending: Internal Medicine | Admitting: Internal Medicine

## 2018-12-02 DIAGNOSIS — I7300 Raynaud's syndrome without gangrene: Secondary | ICD-10-CM

## 2018-12-13 ENCOUNTER — Ambulatory Visit: Payer: Medicare Other | Admitting: Podiatry

## 2018-12-14 ENCOUNTER — Encounter: Payer: Self-pay | Admitting: Podiatry

## 2018-12-14 ENCOUNTER — Ambulatory Visit: Payer: Medicare Other | Admitting: Podiatry

## 2018-12-14 DIAGNOSIS — M79674 Pain in right toe(s): Secondary | ICD-10-CM | POA: Diagnosis not present

## 2018-12-14 DIAGNOSIS — E119 Type 2 diabetes mellitus without complications: Secondary | ICD-10-CM

## 2018-12-14 DIAGNOSIS — M79675 Pain in left toe(s): Secondary | ICD-10-CM

## 2018-12-14 DIAGNOSIS — B351 Tinea unguium: Secondary | ICD-10-CM

## 2018-12-14 NOTE — Progress Notes (Signed)
Complaint:  Visit Type: Patient returns to my office for continued preventative foot care services. Complaint: Patient states" my nails have grown long and thick and become painful to walk and wear shoes" . The patient presents for preventative foot care services. No changes to ROS  Podiatric Exam: Vascular: dorsalis pedis and posterior tibial pulses are weakly  palpable bilateral. Capillary return is immediate. Temperature gradient is WNL. Skin turgor WNL  Sensorium: Normal Semmes Weinstein monofilament test. Normal tactile sensation bilaterally. Nail Exam: Pt has thick disfigured discolored nails with subungual debris noted bilateral Ulcer Exam: There is no evidence of ulcer or pre-ulcerative changes or infection. Orthopedic Exam: Muscle tone and strength are WNL. No limitations in general ROM. No crepitus or effusions noted. Foot type and digits show no abnormalities. Bony prominences are unremarkable. Skin: No Porokeratosis. No infection or ulcers  Diagnosis:  Onychomycosis, , Pain in right toe, pain in left toes  Treatment & Plan Procedures and Treatment: Consent by patient was obtained for treatment procedures.   Debridement of mycotic and hypertrophic toenails, 1 through 5 bilateral and clearing of subungual debris. No ulceration, no infection noted.  Return Visit-Office Procedure: Patient instructed to return to the office for a follow up visit 4 months for continued evaluation and treatment.    Gardiner Barefoot DPM

## 2019-01-25 ENCOUNTER — Telehealth: Payer: Self-pay | Admitting: Cardiovascular Disease

## 2019-01-25 NOTE — Telephone Encounter (Signed)
Per wife's call pt took FLECAINIDE ACTATE 50 mg 7:30 am this morning she has some questions about him doing this.   Please give her a call back.

## 2019-01-25 NOTE — Telephone Encounter (Signed)
Spoke with [name], pts wife.   She states the patient accidentally took one of her pills by accident and wants to know if anything needs to be done about it. Pt is not experiencing any unusual symptoms at this time.   Med taken: 50mg  Fleainide actate   Will route to PharmD for advisement

## 2019-01-25 NOTE — Telephone Encounter (Signed)
Pt should not experience any adverse effects if he took 1 pill by accident - would encourage him and wife to separate their medications to prevent this from happening in the future. He should monitor for any dizziness and keep an eye on his heart rate.

## 2019-01-25 NOTE — Telephone Encounter (Signed)
Called pts spouse and updated her on PharmDs recommendation. Pt verbalized understanding.

## 2019-02-02 ENCOUNTER — Other Ambulatory Visit: Payer: Self-pay | Admitting: Cardiovascular Disease

## 2019-02-02 MED ORDER — BISOPROLOL FUMARATE 5 MG PO TABS: 5.0000 mg | ORAL_TABLET | Freq: Every day | ORAL | 1 refills | Status: DC

## 2019-02-02 NOTE — Telephone Encounter (Signed)
Pt's medication was sent to pt's pharmacy as requested. Confirmation received.  °

## 2019-02-14 ENCOUNTER — Other Ambulatory Visit: Payer: Self-pay

## 2019-02-14 ENCOUNTER — Ambulatory Visit
Admission: RE | Admit: 2019-02-14 | Discharge: 2019-02-14 | Disposition: A | Discharge status: Home / Self Care | Payer: Medicare Other | Source: Ambulatory Visit | Attending: Internal Medicine | Admitting: Internal Medicine

## 2019-02-14 ENCOUNTER — Other Ambulatory Visit: Payer: Self-pay | Admitting: Internal Medicine

## 2019-02-14 DIAGNOSIS — R0789 Other chest pain: Secondary | ICD-10-CM

## 2019-03-02 ENCOUNTER — Telehealth: Payer: Self-pay | Admitting: Cardiovascular Disease

## 2019-03-02 NOTE — Telephone Encounter (Signed)
New Message   Patient's wife would like you to call them back states call was disconnected.

## 2019-03-09 ENCOUNTER — Encounter: Payer: Self-pay | Admitting: Cardiovascular Disease

## 2019-03-09 ENCOUNTER — Ambulatory Visit: Payer: Medicare Other | Admitting: Cardiovascular Disease

## 2019-03-09 ENCOUNTER — Telehealth (INDEPENDENT_AMBULATORY_CARE_PROVIDER_SITE_OTHER): Payer: Medicare Other | Admitting: Cardiovascular Disease

## 2019-03-09 ENCOUNTER — Other Ambulatory Visit: Payer: Self-pay

## 2019-03-09 VITALS — BP 132/79 | HR 71 | Temp 95.5°F | Ht 70.0 in | Wt 195.0 lb

## 2019-03-09 DIAGNOSIS — Z7189 Other specified counseling: Secondary | ICD-10-CM

## 2019-03-09 DIAGNOSIS — R296 Repeated falls: Secondary | ICD-10-CM | POA: Insufficient documentation

## 2019-03-09 DIAGNOSIS — I10 Essential (primary) hypertension: Secondary | ICD-10-CM | POA: Diagnosis not present
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
## 2019-03-09 NOTE — Patient Instructions (Signed)

## 2019-03-15 ENCOUNTER — Other Ambulatory Visit: Payer: Self-pay | Admitting: Cardiovascular Disease

## 2019-03-23 ENCOUNTER — Other Ambulatory Visit: Payer: Self-pay | Admitting: Cardiovascular Disease

## 2019-03-23 MED ORDER — LOSARTAN POTASSIUM 100 MG PO TABS: 100.0000 mg | ORAL_TABLET | Freq: Every day | ORAL | 3 refills | Status: DC

## 2019-03-23 NOTE — Telephone Encounter (Signed)
Ok to switch to [employer] 40 mg a day  If this is not available, can the pharmacist tell us what they have.

## 2019-03-23 NOTE — Telephone Encounter (Signed)
Patient currently taking losartan 100 mg. Please order 100 mg tablets.

## 2019-03-23 NOTE — Telephone Encounter (Signed)
Pt's medication was reordered and sent to pt's pharmacy as requested. Confirmation received.

## 2019-03-23 NOTE — Telephone Encounter (Signed)
Called pt's wife [name] to inform her of the change of medication. Wife verbalized understanding.

## 2019-03-23 NOTE — Telephone Encounter (Signed)
I called the pharmacy and they stated that they have the losartan 100 mg tablet. I reordered this medication and sent it to pt's pharmacy as requested.

## 2019-03-23 NOTE — Telephone Encounter (Signed)
Pharmacy is out of the losartan 50 and 100 mg tablets

## 2019-03-23 NOTE — Telephone Encounter (Signed)
[facility] pharmacy stating that losartan 50 mg tablet is on backorder and they have losartan 25 mg tablets. Would Dr. Acie Fredrickson like to prescribe losartan 25 mg tablet so pt can get his medication? Please address

## 2019-04-02 ENCOUNTER — Other Ambulatory Visit: Payer: Self-pay | Admitting: Cardiovascular Disease

## 2019-04-12 ENCOUNTER — Ambulatory Visit (INDEPENDENT_AMBULATORY_CARE_PROVIDER_SITE_OTHER): Payer: Medicare Other | Admitting: Podiatry

## 2019-04-12 ENCOUNTER — Encounter: Payer: Self-pay | Admitting: Podiatry

## 2019-04-12 ENCOUNTER — Other Ambulatory Visit: Payer: Self-pay

## 2019-04-12 DIAGNOSIS — B351 Tinea unguium: Secondary | ICD-10-CM | POA: Insufficient documentation

## 2019-04-12 DIAGNOSIS — E119 Type 2 diabetes mellitus without complications: Secondary | ICD-10-CM

## 2019-04-12 DIAGNOSIS — M79674 Pain in right toe(s): Secondary | ICD-10-CM | POA: Diagnosis not present

## 2019-04-12 DIAGNOSIS — M79675 Pain in left toe(s): Secondary | ICD-10-CM

## 2019-04-12 NOTE — Progress Notes (Signed)
Complaint:  Visit Type: Patient returns to my office for continued preventative foot care services. Complaint: Patient states" my nails have grown long and thick and become painful to walk and wear shoes" . The patient presents for preventative foot care services. No changes to ROS  Podiatric Exam: Vascular: dorsalis pedis and posterior tibial pulses are weakly  palpable bilateral. Capillary return is immediate. Temperature gradient is WNL. Skin turgor WNL  Sensorium: Normal Semmes Weinstein monofilament test. Normal tactile sensation bilaterally. Nail Exam: Pt has thick disfigured discolored nails with subungual debris noted bilateral Ulcer Exam: There is no evidence of ulcer or pre-ulcerative changes or infection. Orthopedic Exam: Muscle tone and strength are WNL. No limitations in general ROM. No crepitus or effusions noted. Foot type and digits show no abnormalities. Bony prominences are unremarkable. Skin: No Porokeratosis. No infection or ulcers  Diagnosis:  Onychomycosis, , Pain in right toe, pain in left toes  Treatment & Plan Procedures and Treatment: Consent by patient was obtained for treatment procedures.   Debridement of mycotic and hypertrophic toenails, 1 through 5 bilateral and clearing of subungual debris. No ulceration, no infection noted.  Return Visit-Office Procedure: Patient instructed to return to the office for a follow up visit 3 months for continued evaluation and treatment.    Gardiner Barefoot DPM

## 2019-05-23 ENCOUNTER — Telehealth: Payer: Self-pay | Admitting: Cardiovascular Disease

## 2019-05-23 NOTE — Telephone Encounter (Signed)
He needs to consult with his dentist or primary MD for this dental issue. thanks

## 2019-05-23 NOTE — Telephone Encounter (Signed)
Spoke with patient and his sister and advised that Dr. Acie Fredrickson wants patient to contact dentist for recommendation for antibiotic. I advised that the only time we prescribe antibiotics is for SBE prophylaxis after valve replacement. Patient's sister asked if Clindamycin would be given in that situation and I confirmed yes for patients who are allergic to amoxicillin. I advised her to review this information with patient's dentist. She verbalized understanding and agreement and thanked me for the call.

## 2019-05-23 NOTE — Telephone Encounter (Signed)
New Message:    Pt has an infection in his tooth. His dentist wants to know what antibiotic would  Dr Acie Fredrickson approve for pt to take?

## 2019-05-25 ENCOUNTER — Other Ambulatory Visit: Payer: Self-pay | Admitting: Cardiovascular Disease

## 2019-07-11 ENCOUNTER — Ambulatory Visit (INDEPENDENT_AMBULATORY_CARE_PROVIDER_SITE_OTHER): Payer: Medicare Other | Admitting: Podiatry

## 2019-07-11 ENCOUNTER — Other Ambulatory Visit: Payer: Self-pay

## 2019-07-11 ENCOUNTER — Encounter: Payer: Self-pay | Admitting: Podiatry

## 2019-07-11 DIAGNOSIS — M79675 Pain in left toe(s): Secondary | ICD-10-CM | POA: Diagnosis not present

## 2019-07-11 DIAGNOSIS — B351 Tinea unguium: Secondary | ICD-10-CM | POA: Diagnosis not present

## 2019-07-11 DIAGNOSIS — M79674 Pain in right toe(s): Secondary | ICD-10-CM

## 2019-07-11 DIAGNOSIS — E119 Type 2 diabetes mellitus without complications: Secondary | ICD-10-CM

## 2019-07-11 NOTE — Progress Notes (Signed)
Complaint:  Visit Type: Patient returns to my office for continued preventative foot care services. Complaint: Patient states" my nails have grown long and thick and become painful to walk and wear shoes" . The patient presents for preventative foot care services. No changes to ROS  Podiatric Exam: Vascular: dorsalis pedis and posterior tibial pulses are weakly  palpable bilateral. Capillary return is immediate. Temperature gradient is WNL. Skin turgor WNL  Sensorium: Normal Semmes Weinstein monofilament test. Normal tactile sensation bilaterally. Nail Exam: Pt has thick disfigured discolored nails with subungual debris noted bilateral Ulcer Exam: There is no evidence of ulcer or pre-ulcerative changes or infection. Orthopedic Exam: Muscle tone and strength are WNL. No limitations in general ROM. No crepitus or effusions noted. Foot type and digits show no abnormalities. Bony prominences are unremarkable. Skin: No Porokeratosis. No infection or ulcers  Diagnosis:  Onychomycosis, , Pain in right toe, pain in left toes  Treatment & Plan Procedures and Treatment: Consent by patient was obtained for treatment procedures.   Debridement of mycotic and hypertrophic toenails, 1 through 5 bilateral and clearing of subungual debris. No ulceration, no infection noted.  Return Visit-Office Procedure: Patient instructed to return to the office for a follow up visit 3 months for continued evaluation and treatment.    Griselda Tosh DPM 

## 2019-07-19 ENCOUNTER — Ambulatory Visit: Payer: Medicare Other | Admitting: Podiatry

## 2019-07-31 ENCOUNTER — Other Ambulatory Visit: Payer: Self-pay | Admitting: Cardiovascular Disease

## 2019-07-31 DIAGNOSIS — I10 Essential (primary) hypertension: Secondary | ICD-10-CM

## 2019-08-13 ENCOUNTER — Other Ambulatory Visit: Payer: Self-pay | Admitting: Neurology

## 2019-09-03 NOTE — Progress Notes (Signed)
Cardiology Office Note:    Date:  09/05/2019   ID:  LINDSAY GIBEAULT, DOB [birth year], MRN [id number]  PCP:  Lorenda Ishihara, MD  Cardiologist:  Kristeen Miss, MD  Electrophysiologist:  None   Referring MD: Lorenda Ishihara,*   Chief Complaint: follow-up of hypertension  History of Present Illness:    [name] [name] is a 84 y.o. male with a history of hypertension with intermittent episodes of orthostatic hypotension, RBBB, hyperlipidemia, type 2 diabetes mellitus, sleep apnea, BPH, asthma, GERD, significant arthritis, and history of prostate cancer who is followed by Dr. Elease Hashimoto and presents today for routine follow-up.  Echo in 09/2017 showed LVEF of 55-60% with normal wall motion and grade 1 diastolic dysfunction. Lexiscan Myoview in 09/2018 showed medium partially reversible perfusion defect which improved with stress images felt to be suggestive of bowel attenuation. Considered low risk study with no significant reversible ischemia.   Patient was last seen by Dr. Melburn Popper for a telehealth visit in 02/2019 at which point he reported not feeling well due to bad arthritis and allergies but denied any chest pain or dyspnea. He also reported balance issues, generalized fatigue, and weight loss. He had seen a Neurologist and was reportedly told he had dementia and some brain atrophy. BP was well controlled and no medication changes were made.  Recent Labs Reviewed: - 06/2019 [facility]): Hgb 1.8. Cr 0.69, K 4.9. Hgb A1c 6.2. Total Chol 132, Trig 132, HDL 36, LDL 70.  Patient presents today for follow-up.  With his wife the labs provide much of the history given patient's dementia trouble hearing.  He continues to feel very fatigued but denies any worsening numbness.  Continues to have chest pain every once in a while when he is stressed while watching the news.  Patient is not able to ambulate much due to significant arthritis but difficult to tell whether any exertional symptoms.  Pain  usually improves with 2 doses of sublingual Ntro.  He denies any worsening of chest pain.  He reports stable shortness of breath at rest and questionable orthopnea but denies any worsening of this.  He notes occasional ankle swelling but denies any at this time.  He denies any palpitations, lightheadedness, dizziness, syncope.  He does have frequent falls.  He reports falling a couple times per week. A couple of weeks ago, he fell in between the bathtub and the toilet and family had to call 911 order take him up.  No abnormal bleeding.   Past Medical History:  Diagnosis Date  . Acid reflux 09/21/2016  . ALLERGIC RHINITIS 11/15/2008  . Asthma   . Bladder outlet obstruction 03/22/2013  . BPH (benign prostatic hypertrophy)   . Chronic airway obstruction (HCC) 11/14/2008   Methacholine challenge test for 08/04/13 while on GERD RX > neg  Overview:  Overview:  Methacholine challenge test for 08/04/13 while on GERD RX > neg  Last Assessment & Plan:  Please continue your pantoprazole 40mg  in the evening Continue your your loratadine daily You could consider increasing your fluticasone nasal spray to twice a day during the allergy months.  Use your albuterol 2 puffs as n  . Diabetes mellitus, type 2 (HCC) 09/21/2016  . Echocardiogram    Echo 12/18: EF 55-60, normal wall motion, grade 1 diastolic dysfunction, mild MR, mildly dilated RV  . ED (erectile dysfunction) of organic origin 09/21/2012  . Essential hypertension 11/15/2008  . Felon of finger of right hand 04/06/2016  . Gait difficulty   . History of  hypoxemic respiratory failure 09/26/2017  . History of MI (myocardial infarction) 11/15/2008   Reported hx per patient - Patient did not have heart cath/PCI  . History of orthostatic hypotension   . Holter Monitor    Holter 8/18:  NSR, Frequent PVCs, Occasional PACs  . Hyperlipidemia   . Increased frequency of urination 05/27/2017  . Leg weakness 04/13/2011  . Musculoskeletal symptoms referable to limbs  04/13/2011  . Nuclear stress test    Nuclear stress test 12/19: EF 57, no ischemia, +bowel atten artifact, Low Risk  . OSA (obstructive sleep apnea) 05/10/2015  . Paronychia of left middle finger 04/06/2016  . Phimosis 10/08/2016   Overview:  Added automatically from request for surgery [id number]  . Prostate cancer (HCC) 01/02/2009  . Rheumatic fever   . Right bundle branch block 03/17/2013  . Sleep apnea   . Syncope 06/01/2017    Past Surgical History:  Procedure Laterality Date  . APPENDECTOMY  1954  . CATARACT EXTRACTION Left 03/25/2017  . HERNIA REPAIR    . INCISION AND DRAINAGE ABSCESS Right 04/02/2016   Procedure: INCISION AND DRAINAGE ABSCESS;  Surgeon: Betha Loa, MD;  Location: [facility];  Service: Orthopedics;  Laterality: Right;  Marland Kitchen VASECTOMY      Current Medications: Current Meds  Medication Sig  . acetaminophen (TYLENOL) 500 MG tablet Take 500 mg by mouth 2 (two) times daily as needed for moderate pain.   Marland Kitchen albuterol (PROVENTIL) (2.5 MG/3ML) 0.083% nebulizer solution Take 2.5 mg by nebulization daily as needed.   . ALPRAZolam (XANAX) 0.5 MG tablet Take 0.5 tablets by mouth 2 (two) times daily.  Marland Kitchen aspirin EC 81 MG tablet Take 1 tablet by mouth daily.  . bisoprolol (ZEBETA) 5 MG tablet Take 1 tablet (5 mg total) by mouth daily.  Marland Kitchen escitalopram (LEXAPRO) 10 MG tablet Take 10 mg by mouth daily.  . finasteride (PROSCAR) 5 MG tablet Take 5 mg by mouth daily.    Marland Kitchen glucose blood (TRUETEST TEST) test strip   . latanoprost (XALATAN) 0.005 % ophthalmic solution Place 1 drop into both eyes daily.  Marland Kitchen levETIRAcetam (KEPPRA) 750 MG tablet TAKE 1 TABLET BY MOUTH TWICE A DAY  . losartan (COZAAR) 100 MG tablet Take 1 tablet (100 mg total) by mouth daily. (Patient taking differently: Take 25 mg by mouth daily. )  . magnesium oxide (MAG-OX) 400 MG tablet Take 400 mg by mouth daily.  . memantine (NAMENDA) 10 MG tablet TAKE 1 TABLET BY MOUTH TWICE A DAY  . metFORMIN  (GLUCOPHAGE) 500 MG tablet Take 1,000 mg by mouth at bedtime.   . montelukast (SINGULAIR) 10 MG tablet Take 10 mg by mouth at bedtime.  . Multiple Vitamins-Minerals (PRESERVISION AREDS 2+MULTI VIT PO) Take 1 tablet by mouth 2 (two) times daily.  . nitroGLYCERIN (NITROSTAT) 0.4 MG SL tablet PLACE 1 TABLET UNDER THE TONGUE EVERY 5 MINS AS NEEDED FOR UP TO 3 DOSES IF PAIN CONTINUES,CALL 911  . pantoprazole (PROTONIX) 40 MG tablet TAKE 1 TABLET BY MOUTH DAILY.  Marland Kitchen potassium chloride SA (K-DUR) 20 MEQ tablet Take 20 mEq by mouth daily.  . rosuvastatin (CRESTOR) 10 MG tablet TAKE 1 TABLET BY MOUTH EVERY DAY  . triamterene-hydrochlorothiazide (DYAZIDE) 37.5-25 MG capsule TAKE 1 CAPSULE BY MOUTH EVERY MORNING     Allergies:   Doxycycline monohydrate, Penicillins, Fluticasone-salmeterol, Levetiracetam, Lisinopril, Prednisone, Shark cartilage, Tetracyclines & related, and Tetracycline   Social History   Socioeconomic History  . Marital status: Married  Spouse name: Not on file  . Number of children: 2  . Years of education: 3 years of college  . Highest education level: Not on file  Occupational History  . Occupation: retired    Comment: Agricultural consultant for [employer]  . Financial resource strain: Not on file  . Food insecurity    Worry: Not on file    Inability: Not on file  . Transportation needs    Medical: Not on file    Non-medical: Not on file  Tobacco Use  . Smoking status: Former Smoker    Packs/day: 1.00    Years: 30.00    Pack years: 30.00    Types: Cigarettes, Pipe    Quit date: 10/19/2012    Years since quitting: 6.8  . Smokeless tobacco: Never Used  . Tobacco comment: 1 pipe every 3 days, quit smoking cig 12 yrs ago 07/06/13  Substance and Sexual Activity  . Alcohol use: No    Comment: no etoh in 12 years  . Drug use: No  . Sexual activity: Not on file  Lifestyle  . Physical activity    Days per week: Not on file    Minutes per session: Not on file  .  Stress: Not on file  Relationships  . Social Musician on phone: Not on file    Gets together: Not on file    Attends religious service: Not on file    Active member of club or organization: Not on file    Attends meetings of clubs or organizations: Not on file    Relationship status: Not on file  Other Topics Concern  . Not on file  Social History Narrative   Lives at home with his wife.   Right-handed.   3 cups caffeine per day.     Family History: The patient's family history includes Arthritis in an other family member; Diabetes in an other family member; Heart disease in an other family member; Heart failure in his mother; Hyperlipidemia in an other family member; Kidney disease in an other family member; Obesity in an other family member; Stroke in his brother and father.  ROS:   Please see the history of present illness.    All other systems reviewed and are negative.  EKGs/Labs/Other Studies Reviewed:    The following studies were reviewed today:  Echocardiogram 10/05/2017: Study Conclusions: - Left ventricle: The cavity size was normal. Wall thickness was   normal. Systolic function was normal. The estimated ejection   fraction was in the range of 55% to 60%. Wall motion was normal;   there were no regional wall motion abnormalities. Doppler   parameters are consistent with abnormal left ventricular   relaxation (grade 1 diastolic dysfunction). - Aortic valve: Trileaflet; mildly thickened, mildly calcified   leaflets. - Mitral valve: There was mild regurgitation. - Right ventricle: The cavity size was mildly dilated. Wall   thickness was normal. _______________  Eugenie Birks Myoview 09/26/2018:  Nuclear stress EF: 57%.  No T wave inversion was noted during stress.  There was no ST segment deviation noted during stress.  Defect 1: There is a medium defect of mild severity.  This is a low risk study.   Medium size, mild severity partially reversible  inferior perfusion defect, which improves in the upright stress images, compared to the supine stress images, suggestive of bowel attenuation artifact. No significant reversible ischemia. LVEF 57% with normal wall motion. This is a low risk study.  EKG:  EKG ordered today. EKG personally reviewed and demonstrates sins bradycardia, rate 59 bpm, with PVC, chronic 1st degree AV block chronic RBBB, left axis deviation, and non-specific ST/T changes in lead III. No significant changes compared to prior tracings.   Recent Labs: No results found for requested labs within last 8760 hours.  Recent Lipid Panel    Component Value Date/Time   CHOL 141 03/09/2018 1636   TRIG 113 03/09/2018 1636   HDL 46 03/09/2018 1636   CHOLHDL 3.1 03/09/2018 1636   CHOLHDL 3.5 08/31/2016 0938   VLDL 29 08/31/2016 0938   LDLCALC 72 03/09/2018 1636   LDLDIRECT 84.6 04/13/2012 1147    Physical Exam:    Vital Signs: BP (!) 150/82   Pulse (!) 59   Ht 5\' 10"  (1.778 m)   Wt 192 lb (87.1 kg)   SpO2 98%   BMI 27.55 kg/m     Wt Readings from Last 3 Encounters:  09/05/19 192 lb (87.1 kg)  03/09/19 195 lb (88.5 kg)  09/26/18 206 lb (93.4 kg)     General: 84 y.o. male resting comfortably in no acute distress. HEENT: Normocephalic and atraumatic. Sclera clear. EOMs intact. Neck: Supple. No carotid bruits. No JVD. Heart: RRR. Distinct S1 and S2. No murmurs, gallops, or rubs. Radial pulses 2+ and equal bilaterally. Difficult to palpate lower extremity pulses. Lungs: No increased work of breathing. Clear to ausculation bilaterally. No wheezes, rhonchi, or rales.  Abdomen: Soft, non-distended, and non-tender to palpation. Bowel sounds present in all 4 quadrants.  MSK: Generalized weakness. Ambulates with a walker.  Extremities: No lower extremity edema.  Skin: Lower extremities cool to the touch (left > right). Prolonged capillary refill of bilateral lower extremity digits. Left foot mildly erythematous compared to  right foot. Neuro:  No focal deficits. Psych: Normal affect. Responds appropriately. Baseline dementia.   Assessment:    1. Chest pain of uncertain etiology   2. Dyspnea, unspecified type   3. Benign essential HTN   4. Hyperlipidemia, unspecified hyperlipidemia type   5. Type 2 diabetes mellitus without complication, without long-term current use of insulin (HCC)   6. Weak arterial pulse   7. Prolonged capillary refill time     Plan:    Chest Pain and Dyspnea Patient continues to report occasional chest pain at rest normally when he is feeling stressed. However, chest pain does resolve with sublingual Ntiro. He also reports chronic and stable dyspnea. Patient is not able to ambulate much due to severe arthritis in his hips so difficult to tell whether he has any exertional symptoms. He had low risk stress test in 09/2018. LVEF of 55-60% on Echo in 2018. He denies any worsening of symptoms since then. Euvolemic on exam. EKG shows no acute changes. Do not think patient needs any additional work-up at this time as symptoms are stable. Continue medical therapy. Could consider adding low dose Imdur in the future; however, will hold off at this time given patient has history of orthostatic hypotension and has frequent falls. Patient to notify us if chest pain becomes more frequent or Nitro does not help. Reviewed how to take [facility] with patient.   Hypertension BP elevated 150/82 but patient states he has not taken his BP medications yesterday or today. Therefore, will not make any changes today. Continue currently medications which include Losartan 25mg  daily (previously taking 100mg  daily but PCP reportedly recently reduced this), Triamterene/HCTZ 37.5-25mg  daily, and Bisoprolol 5mg  daily. Creatinine and potassium stable on labs form  06/2019 (from [facility]).    Hyperlipidemia LDL 70 in 06/2019 (from [facility]). Continue Crestor 10mg  daily. Labs followed by PCP.  Type 2 Diabetes Mellitus Hemoglobin A1c 6.2  in 06/2019.bManaged by PCP.  Leg Pain / Poor Lower Extremity Pulses / Prolonged Capillary Refill Patient has concerns of poor circulation in his legs. He has some leg pain due to severe arthritis. However, he states his legs are always cold (especially his left leg). He does have prolonged cap refill on exam and difficult to palpate distal pulses. Discussed with patient and wife that we could check lower extremity dopplers/ABIs but I don't know if he would necessarily be a candidate for any intervention if we found significant PAD. However, patient/wife would like to go ahead and check so will order dopplers/ABIs today.   Disposition: Follow up in 4 months with Dr. Elease Hashimoto.   Medication Adjustments/Labs and Tests Ordered: Current medicines are reviewed at length with the patient today.  Concerns regarding medicines are outlined above.  Orders Placed This Encounter  Procedures  . EKG 12-Lead cs  . VAS [place] LOWER EXTREMITY ARTERIAL DUPLEX  . VAS [place] ABI WITH/WO TBI   No orders of the defined types were placed in this encounter.   Patient Instructions  Your physician recommends that you continue on your current medications as directed. Please refer to the Current Medication list given to you today. Your physician has requested that you have a lower extremity arterial exercise duplex. During this test, exercise and ultrasound are used to evaluate arterial blood flow in the legs. Allow one hour for this exam. There are no restrictions or special instructions.LOWER EXTREMITY DOPPLER    Your physician wants you to follow-up in: 4 MONTHS WITH DR Elease Hashimoto You will receive a reminder letter in the mail two months in advance. If you don't receive a letter, please call our office to schedule the follow-up appointment.     Signed, Corrin Parker, PA-C  09/05/2019 2:28 PM    [facility]

## 2019-09-04 ENCOUNTER — Telehealth: Payer: Self-pay | Admitting: Physician Assistant

## 2019-09-04 NOTE — Telephone Encounter (Signed)
New message    Spouse requesting to accompany patient on 11/17; hearing impaired

## 2019-09-04 NOTE — Telephone Encounter (Signed)
I spoke to the patient's wife who will accompany the patient to his OV on 11/17, because he is HOH.

## 2019-09-05 ENCOUNTER — Other Ambulatory Visit: Payer: Self-pay

## 2019-09-05 ENCOUNTER — Encounter: Payer: Self-pay | Admitting: Student

## 2019-09-05 ENCOUNTER — Ambulatory Visit (INDEPENDENT_AMBULATORY_CARE_PROVIDER_SITE_OTHER): Payer: Medicare Other | Admitting: Student

## 2019-09-05 VITALS — BP 150/82 | HR 59 | Ht 70.0 in | Wt 192.0 lb

## 2019-09-05 DIAGNOSIS — R079 Chest pain, unspecified: Secondary | ICD-10-CM | POA: Diagnosis not present

## 2019-09-05 DIAGNOSIS — I10 Essential (primary) hypertension: Secondary | ICD-10-CM | POA: Diagnosis not present

## 2019-09-05 DIAGNOSIS — E785 Hyperlipidemia, unspecified: Secondary | ICD-10-CM

## 2019-09-05 DIAGNOSIS — E119 Type 2 diabetes mellitus without complications: Secondary | ICD-10-CM

## 2019-09-05 DIAGNOSIS — R0989 Other specified symptoms and signs involving the circulatory and respiratory systems: Secondary | ICD-10-CM

## 2019-09-05 DIAGNOSIS — R0600 Dyspnea, unspecified: Secondary | ICD-10-CM | POA: Diagnosis not present

## 2019-09-05 MED ORDER — LOSARTAN POTASSIUM 25 MG PO TABS: 25.0000 mg | ORAL_TABLET | Freq: Every day | ORAL | Status: AC

## 2019-09-05 NOTE — Patient Instructions (Addendum)
Your physician recommends that you continue on your current medications as directed. Please refer to the Current Medication list given to you today. Your physician has requested that you have a lower extremity arterial exercise duplex. During this test, exercise and ultrasound are used to evaluate arterial blood flow in the legs. Allow one hour for this exam. There are no restrictions or special instructions.LOWER EXTREMITY DOPPLER    Your physician wants you to follow-up in: [name] [name] will receive a reminder letter in the mail two months in advance. If you don't receive a letter, please call our office to schedule the follow-up appointment.

## 2019-09-05 NOTE — Addendum Note (Signed)
Addended by: Devra Dopp E on: 09/05/2019 02:40 PM   Modules accepted: Orders

## 2019-09-06 ENCOUNTER — Other Ambulatory Visit: Payer: Self-pay | Admitting: Neurology

## 2019-09-19 ENCOUNTER — Other Ambulatory Visit: Payer: Self-pay | Admitting: Neurology

## 2019-09-19 DIAGNOSIS — S065XAA Traumatic subdural hemorrhage with loss of consciousness status unknown, initial encounter: Secondary | ICD-10-CM

## 2019-09-19 DIAGNOSIS — G40209 Localization-related (focal) (partial) symptomatic epilepsy and epileptic syndromes with complex partial seizures, not intractable, without status epilepticus: Secondary | ICD-10-CM

## 2019-09-19 DIAGNOSIS — S065X9A Traumatic subdural hemorrhage with loss of consciousness of unspecified duration, initial encounter: Secondary | ICD-10-CM

## 2019-09-19 DIAGNOSIS — R269 Unspecified abnormalities of gait and mobility: Secondary | ICD-10-CM

## 2019-09-25 ENCOUNTER — Other Ambulatory Visit: Payer: Self-pay

## 2019-09-25 ENCOUNTER — Encounter: Payer: Self-pay | Admitting: Neurology

## 2019-09-25 ENCOUNTER — Ambulatory Visit: Payer: Medicare Other | Admitting: Neurology

## 2019-09-25 VITALS — BP 130/70 | HR 60 | Temp 97.3°F | Ht 70.0 in | Wt 184.0 lb

## 2019-09-25 DIAGNOSIS — G40209 Localization-related (focal) (partial) symptomatic epilepsy and epileptic syndromes with complex partial seizures, not intractable, without status epilepticus: Secondary | ICD-10-CM | POA: Diagnosis not present

## 2019-09-25 DIAGNOSIS — R269 Unspecified abnormalities of gait and mobility: Secondary | ICD-10-CM | POA: Diagnosis not present

## 2019-09-25 MED ORDER — MEMANTINE HCL 10 MG PO TABS: 10.0000 mg | ORAL_TABLET | Freq: Two times a day (BID) | ORAL | 4 refills | Status: DC
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
## 2019-09-25 NOTE — Patient Instructions (Signed)
Continue current medications, Keppra and Namenda   Please be careful not to fall, consider Physical Therapy in the home   Return in 6 months

## 2019-09-27 ENCOUNTER — Ambulatory Visit (HOSPITAL_COMMUNITY)
Admission: RE | Admit: 2019-09-27 | Discharge: 2019-09-27 | Disposition: A | Discharge status: Home / Self Care | Payer: Medicare Other | Source: Ambulatory Visit | Attending: Cardiology | Admitting: Cardiology

## 2019-09-27 ENCOUNTER — Other Ambulatory Visit: Payer: Self-pay

## 2019-09-27 DIAGNOSIS — R0989 Other specified symptoms and signs involving the circulatory and respiratory systems: Secondary | ICD-10-CM | POA: Insufficient documentation

## 2019-10-02 ENCOUNTER — Telehealth: Payer: Self-pay | Admitting: *Deleted

## 2019-10-02 DIAGNOSIS — I739 Peripheral vascular disease, unspecified: Secondary | ICD-10-CM

## 2019-10-02 DIAGNOSIS — R0989 Other specified symptoms and signs involving the circulatory and respiratory systems: Secondary | ICD-10-CM

## 2019-10-02 DIAGNOSIS — R936 Abnormal findings on diagnostic imaging of limbs: Secondary | ICD-10-CM

## 2019-10-02 NOTE — Telephone Encounter (Signed)
-----   Message from Darreld Mclean, [place] sent at 09/29/2019  9:54 PM EST ----- Please notify patient of results: Lower extremity dopplers showed blockages in arteries of left leg which does explain some of symptoms patient has been having. Would refer to Dr. Gwenlyn Found for further evaluation/management of PAD if patient is willing.  Thank you!

## 2019-10-02 NOTE — Telephone Encounter (Signed)
Notified the pt of his lower extremity arterial US results and recommendations per Sande Rives PA-C.  Informed the pt that per [facility], his lower extremity showed blockages in the arteries of his left leg, which does explain some of his symptoms patient has been having.  Informed the pt that Callie recommends that we refer him to Dr. Gwenlyn Found over at our [place] office, for [facility] Consult, and evaluation/management of his PAD.  Informed the pt that I will place the referral in the system and send our [facility] schedulers a message to call the pt back and arrange this appt.  Both the pt and wife verbalized understanding and agrees with this plan.

## 2019-10-04 ENCOUNTER — Telehealth: Payer: Self-pay | Admitting: *Deleted

## 2019-10-04 NOTE — Telephone Encounter (Signed)
-----   Message from Armando Gang sent at 10/04/2019  9:16 AM EST ----- Regarding: RE: pt needs new consult appt with [facility] MD Dr. Gwenlyn Found per [facility] for 10-24-19 with berry  ----- Message ----- From: Nuala Alpha, LPN Sent: [id number]   5:15 PM EST To: [facility] Subject: pt needs new consult appt with [facility] MD Dr. Wilhemina Cash  Per Sande Rives PA-C, this pt had a lower extremity arterial US done and it showed blockage in the left leg and suggest PAD.  Callie would like for this pt to be referred to [facility] MD Dr. Gwenlyn Found for new pt consult for this abnormal finding.  Referral is in the system and pt and wife are aware you will call to schedule.  Wife prefers you call her to schedule his appt with Dr. Gwenlyn Found, for the pt has a hard time hearing.  Wife states to call them on this Wednesday 12/16, for they will be away tomorrow at another MD appt at the [place].  Can you please call and schedule and send the appt date to triage?  Thanks for all you do, Landin Tallon Triage

## 2019-10-18 ENCOUNTER — Other Ambulatory Visit: Payer: Self-pay

## 2019-10-18 ENCOUNTER — Encounter: Payer: Self-pay | Admitting: Podiatry

## 2019-10-18 ENCOUNTER — Ambulatory Visit: Payer: Medicare Other | Admitting: Podiatry

## 2019-10-18 DIAGNOSIS — E119 Type 2 diabetes mellitus without complications: Secondary | ICD-10-CM

## 2019-10-18 DIAGNOSIS — M79675 Pain in left toe(s): Secondary | ICD-10-CM

## 2019-10-18 DIAGNOSIS — B351 Tinea unguium: Secondary | ICD-10-CM

## 2019-10-18 DIAGNOSIS — M79674 Pain in right toe(s): Secondary | ICD-10-CM

## 2019-10-18 NOTE — Progress Notes (Signed)
Complaint:  Visit Type: Patient returns to my office for continued preventative foot care services. Complaint: Patient states" my nails have grown long and thick and become painful to walk and wear shoes" . The patient presents for preventative foot care services. No changes to ROS  Podiatric Exam: Vascular: dorsalis pedis and posterior tibial pulses are weakly  palpable bilateral. Capillary return is immediate. Temperature gradient is WNL. Skin turgor WNL  Sensorium: Normal Semmes Weinstein monofilament test. Normal tactile sensation bilaterally. Nail Exam: Pt has thick disfigured discolored nails with subungual debris noted bilateral Ulcer Exam: There is no evidence of ulcer or pre-ulcerative changes or infection. Orthopedic Exam: Muscle tone and strength are WNL. No limitations in general ROM. No crepitus or effusions noted. Foot type and digits show no abnormalities. Bony prominences are unremarkable. Skin: No Porokeratosis. No infection or ulcers  Diagnosis:  Onychomycosis, , Pain in right toe, pain in left toes  Treatment & Plan Procedures and Treatment: Consent by patient was obtained for treatment procedures.   Debridement of mycotic and hypertrophic toenails, 1 through 5 bilateral and clearing of subungual debris. No ulceration, no infection noted.  Return Visit-Office Procedure: Patient instructed to return to the office for a follow up visit 3 months for continued evaluation and treatment.    Gardiner Barefoot DPM

## 2019-10-24 ENCOUNTER — Ambulatory Visit: Payer: Medicare Other | Admitting: Cardiovascular Disease

## 2019-10-24 ENCOUNTER — Other Ambulatory Visit: Payer: Self-pay

## 2019-10-24 ENCOUNTER — Encounter: Payer: Self-pay | Admitting: Cardiovascular Disease

## 2019-10-24 DIAGNOSIS — I739 Peripheral vascular disease, unspecified: Secondary | ICD-10-CM | POA: Diagnosis not present

## 2019-10-24 NOTE — Progress Notes (Signed)
10/24/2019 [name] [name]   [birth year]  [id number]  Primary Physician [name] Ishihara, MD Primary Cardiologist: [name] Gess MD [name] [name], [place], [place]  HPI:  [name] [name] is a 85 y.o. moderately overweight married Caucasian male accompanied by his wife [name] [name] today.  He was referred by Dr. Elease [name] for peripheral vasodilation because of mildly abnormal Doppler studies.  He has a history of tobacco abuse having quit 20 years ago, treated hypertension hyperlipidemia.  Is never had a heart tach or stroke.  Does have dementia.  He is minimally ambulatory walks with a walker.  He does have normal LV function and a minimally abnormal Myoview stress test.  He had Doppler studies performed in our office 09/27/2019 revealing a right ABI of 1.08 and a left of 0.87.  Mild left SFA disease and an occluded posterior tibial artery.  He really denies claudication.   Current Meds  Medication Sig  . acetaminophen (TYLENOL) 500 MG tablet Take 500 mg by mouth 2 (two) times daily as needed for moderate pain.   Marland Kitchen albuterol (PROVENTIL) (2.5 MG/3ML) 0.083% nebulizer solution Take 2.5 mg by nebulization daily as needed.   . ALPRAZolam (XANAX) 0.5 MG tablet Take 0.5 tablets by mouth 2 (two) times daily.  Marland Kitchen aspirin EC 81 MG tablet Take 1 tablet by mouth daily.  . bisoprolol (ZEBETA) 5 MG tablet Take 1 tablet (5 mg total) by mouth daily.  Marland Kitchen escitalopram (LEXAPRO) 10 MG tablet Take 10 mg by mouth daily.  . finasteride (PROSCAR) 5 MG tablet Take 5 mg by mouth daily.    Marland Kitchen glucose blood (TRUETEST TEST) test strip   . latanoprost (XALATAN) 0.005 % ophthalmic solution Place 1 drop into both eyes daily.  Marland Kitchen levETIRAcetam (KEPPRA) 750 MG tablet TAKE 1 TABLET BY MOUTH TWICE A DAY  . losartan (COZAAR) 25 MG tablet Take 1 tablet (25 mg total) by mouth daily.  . magnesium oxide (MAG-OX) 400 MG tablet Take 400 mg by mouth daily.  . memantine (NAMENDA) 10 MG tablet Take 1 tablet (10 mg total) by mouth 2 (two) times  daily.  . metFORMIN (GLUCOPHAGE) 500 MG tablet Take 1,000 mg by mouth at bedtime.   . montelukast (SINGULAIR) 10 MG tablet Take 10 mg by mouth at bedtime.  . Multiple Vitamins-Minerals (PRESERVISION AREDS 2+MULTI VIT PO) Take 1 tablet by mouth 2 (two) times daily.  . nitroGLYCERIN (NITROSTAT) 0.4 MG SL tablet PLACE 1 TABLET UNDER THE TONGUE EVERY 5 MINS AS NEEDED FOR UP TO 3 DOSES IF PAIN CONTINUES,CALL 911  . pantoprazole (PROTONIX) 40 MG tablet TAKE 1 TABLET BY MOUTH DAILY.  Marland Kitchen potassium chloride SA (K-DUR) 20 MEQ tablet Take 20 mEq by mouth daily.  . rosuvastatin (CRESTOR) 10 MG tablet TAKE 1 TABLET BY MOUTH EVERY DAY  . triamterene-hydrochlorothiazide (DYAZIDE) 37.5-25 MG capsule TAKE 1 CAPSULE BY MOUTH EVERY MORNING     Allergies  Allergen Reactions  . Doxycycline Monohydrate Hives  . Penicillins Hives    Has patient had a PCN reaction causing immediate rash, facial/tongue/throat swelling, SOB or lightheadedness with hypotension: No Has patient had a PCN reaction causing severe rash involving mucus membranes or skin necrosis: Yes Has patient had a PCN reaction that required hospitalization: No Has patient had a PCN reaction occurring within the last 10 years: No If all of the above answers are "NO", then may proceed with Cephalosporin use.   Marland Kitchen Fluticasone-Salmeterol     REACTION: sensation of throat closing REACTION: sensation of  throat closing  . Levetiracetam Other (See Comments)    aggitation  . Lisinopril Cough  . Prednisone     Per NW:GNFAOZH agitation Per YQ:MVHQION agitation  . Shark Cartilage   . Tetracyclines & Related Hives  . Tetracycline Rash    Social History   Socioeconomic History  . Marital status: Married    Spouse name: Not on file  . Number of children: 2  . Years of education: 3 years of college  . Highest education level: Not on file  Occupational History  . Occupation: retired    Comment: Agricultural consultant for [employer]  . Smoking  status: Former Smoker    Packs/day: 1.00    Years: 30.00    Pack years: 30.00    Types: Cigarettes, Pipe    Quit date: 10/19/2012    Years since quitting: 7.0  . Smokeless tobacco: Never Used  . Tobacco comment: 1 pipe every 3 days, quit smoking cig 12 yrs ago 07/06/13  Substance and Sexual Activity  . Alcohol use: No    Comment: no etoh in 12 years  . Drug use: No  . Sexual activity: Not on file  Other Topics Concern  . Not on file  Social History Narrative   Lives at home with his wife.   Right-handed.   3 cups caffeine per day.   Social Determinants of Health   Financial Resource Strain:   . Difficulty of Paying Living Expenses: Not on file  Food Insecurity:   . Worried About Programme researcher, broadcasting/film/video in the Last Year: Not on file  . Ran Out of Food in the Last Year: Not on file  Transportation Needs:   . Lack of Transportation (Medical): Not on file  . Lack of Transportation (Non-Medical): Not on file  Physical Activity:   . Days of Exercise per Week: Not on file  . Minutes of Exercise per Session: Not on file  Stress:   . Feeling of Stress : Not on file  Social Connections:   . Frequency of Communication with Friends and Family: Not on file  . Frequency of Social Gatherings with Friends and Family: Not on file  . Attends Religious Services: Not on file  . Active Member of Clubs or Organizations: Not on file  . Attends Banker Meetings: Not on file  . Marital Status: Not on file  Intimate Partner Violence:   . Fear of Current or Ex-Partner: Not on file  . Emotionally Abused: Not on file  . Physically Abused: Not on file  . Sexually Abused: Not on file     Review of Systems: General: negative for chills, fever, night sweats or weight changes.  Cardiovascular: negative for chest pain, dyspnea on exertion, edema, orthopnea, palpitations, paroxysmal nocturnal dyspnea or shortness of breath Dermatological: negative for rash Respiratory: negative for cough or  wheezing Urologic: negative for hematuria Abdominal: negative for nausea, vomiting, diarrhea, bright red blood per rectum, melena, or hematemesis Neurologic: negative for visual changes, syncope, or dizziness All other systems reviewed and are otherwise negative except as noted above.    Blood pressure 119/72, pulse (!) 59, temperature (!) 97.2 F (36.2 C), height 5\' 10"  (1.778 m), weight 189 lb (85.7 kg), SpO2 98 %.  General appearance: alert and no distress Neck: no adenopathy, no carotid bruit, no JVD, supple, symmetrical, trachea midline and thyroid not enlarged, symmetric, no tenderness/mass/nodules Lungs: clear to auscultation bilaterally Heart: regular rate and rhythm, S1, S2 normal, no murmur,  click, rub or gallop Extremities: extremities normal, atraumatic, no cyanosis or edema Pulses: Diminished pedal pulses bilaterally Skin: Skin color, texture, turgor normal. No rashes or lesions Neurologic: Alert and oriented X 3, normal strength and tone. Normal symmetric reflexes. Normal coordination and gait  EKG not performed today  ASSESSMENT AND PLAN:   Peripheral arterial disease [facility]) Mr. Wanek was referred to me by Dr. Elease [name] for evaluation of PAD.  He has a history of hypertension hyperlipidemia.  Is never had a heart tach or stroke.  He smoked remotely.  He walks with a walker around the house and is minimally ambulatory.  He does have reactive airways disease.  There is no history of ischemic heart disease.  He had lower extremity arterial Doppler studies performed 09/27/2019 revealing a right ABI of 1.08 and a left of 0.87.  He had mild disease in his SFA and occluded posterior tibial in the midportion.  He really denies claudication.  Is no evidence of an open wound suggesting chronic limb ischemia.  No further evaluation or work-up is required at this time.      [name] Gess MD FACP,FACC,FAHA, [facility] 10/24/2019 2:04 PM

## 2019-10-24 NOTE — Patient Instructions (Signed)

## 2019-10-24 NOTE — Assessment & Plan Note (Signed)
[name] [name] was referred to me by Dr. Acie Fredrickson for evaluation of PAD.  He has a history of hypertension hyperlipidemia.  Is never had a heart tach or stroke.  He smoked remotely.  He walks with a walker around the house and is minimally ambulatory.  He does have reactive airways disease.  There is no history of ischemic heart disease.  He had lower extremity arterial Doppler studies performed 09/27/2019 revealing a right ABI of 1.08 and a left of 0.87.  He had mild disease in his SFA and occluded posterior tibial in the midportion.  He really denies claudication.  Is no evidence of an open wound suggesting chronic limb ischemia.  No further evaluation or work-up is required at this time.

## 2019-11-01 ENCOUNTER — Encounter (HOSPITAL_COMMUNITY): Payer: Self-pay

## 2019-11-01 ENCOUNTER — Telehealth: Payer: Self-pay | Admitting: Surgery

## 2019-11-01 ENCOUNTER — Emergency Department (HOSPITAL_COMMUNITY)
Admission: EM | Admit: 2019-11-01 | Discharge: 2019-11-02 | Disposition: A | Discharge status: Home / Self Care | Payer: Medicare Other | Attending: Emergency Medicine | Admitting: Emergency Medicine

## 2019-11-01 ENCOUNTER — Emergency Department (HOSPITAL_COMMUNITY): Payer: Medicare Other

## 2019-11-01 ENCOUNTER — Other Ambulatory Visit: Payer: Self-pay

## 2019-11-01 DIAGNOSIS — Z87891 Personal history of nicotine dependence: Secondary | ICD-10-CM | POA: Insufficient documentation

## 2019-11-01 DIAGNOSIS — Z79899 Other long term (current) drug therapy: Secondary | ICD-10-CM | POA: Insufficient documentation

## 2019-11-01 DIAGNOSIS — J45909 Unspecified asthma, uncomplicated: Secondary | ICD-10-CM | POA: Insufficient documentation

## 2019-11-01 DIAGNOSIS — I252 Old myocardial infarction: Secondary | ICD-10-CM | POA: Insufficient documentation

## 2019-11-01 DIAGNOSIS — Z7984 Long term (current) use of oral hypoglycemic drugs: Secondary | ICD-10-CM | POA: Insufficient documentation

## 2019-11-01 DIAGNOSIS — Z7982 Long term (current) use of aspirin: Secondary | ICD-10-CM | POA: Diagnosis not present

## 2019-11-01 DIAGNOSIS — I714 Abdominal aortic aneurysm, without rupture, unspecified: Secondary | ICD-10-CM

## 2019-11-01 DIAGNOSIS — I10 Essential (primary) hypertension: Secondary | ICD-10-CM | POA: Insufficient documentation

## 2019-11-01 DIAGNOSIS — R42 Dizziness and giddiness: Secondary | ICD-10-CM | POA: Insufficient documentation

## 2019-11-01 DIAGNOSIS — Z8546 Personal history of malignant neoplasm of prostate: Secondary | ICD-10-CM | POA: Diagnosis not present

## 2019-11-01 DIAGNOSIS — R1013 Epigastric pain: Secondary | ICD-10-CM | POA: Diagnosis present

## 2019-11-01 DIAGNOSIS — E119 Type 2 diabetes mellitus without complications: Secondary | ICD-10-CM | POA: Insufficient documentation

## 2019-11-01 DIAGNOSIS — I959 Hypotension, unspecified: Secondary | ICD-10-CM

## 2019-11-01 LAB — CBC WITH DIFFERENTIAL/PLATELET
Abs Immature Granulocytes: 0.05 10*3/uL (ref 0.00–0.07)
Basophils Absolute: 0.1 10*3/uL (ref 0.0–0.1)
Basophils Relative: 1 %
Eosinophils Absolute: 0.1 10*3/uL (ref 0.0–0.5)
Eosinophils Relative: 2 %
HCT: 43 % (ref 39.0–52.0)
Hemoglobin: 14.2 g/dL (ref 13.0–17.0)
Immature Granulocytes: 1 %
Lymphocytes Relative: 15 %
Lymphs Abs: 1.2 10*3/uL (ref 0.7–4.0)
MCH: 29.3 pg (ref 26.0–34.0)
MCHC: 33 g/dL (ref 30.0–36.0)
MCV: 88.8 fL (ref 80.0–100.0)
Monocytes Absolute: 0.5 10*3/uL (ref 0.1–1.0)
Monocytes Relative: 6 %
Neutro Abs: 6.2 10*3/uL (ref 1.7–7.7)
Neutrophils Relative %: 75 %
Platelets: 186 10*3/uL (ref 150–400)
RBC: 4.84 MIL/uL (ref 4.22–5.81)
RDW: 13.1 % (ref 11.5–15.5)
WBC: 8.2 10*3/uL (ref 4.0–10.5)
nRBC: 0 % (ref 0.0–0.2)

## 2019-11-01 LAB — I-STAT CHEM 8, ED
BUN: 14 mg/dL (ref 8–23)
Calcium, Ion: 1.07 mmol/L — ABNORMAL LOW (ref 1.15–1.40)
Chloride: 100 mmol/L (ref 98–111)
Creatinine, Ser: 0.8 mg/dL (ref 0.61–1.24)
Glucose, Bld: 117 mg/dL — ABNORMAL HIGH (ref 70–99)
HCT: 40 % (ref 39.0–52.0)
Hemoglobin: 13.6 g/dL (ref 13.0–17.0)
Potassium: 3.4 mmol/L — ABNORMAL LOW (ref 3.5–5.1)
Sodium: 138 mmol/L (ref 135–145)
TCO2: 27 mmol/L (ref 22–32)

## 2019-11-01 LAB — COMPREHENSIVE METABOLIC PANEL
ALT: 12 U/L (ref 0–44)
AST: 16 U/L (ref 15–41)
Albumin: 3.7 g/dL (ref 3.5–5.0)
Alkaline Phosphatase: 70 U/L (ref 38–126)
Anion gap: 12 (ref 5–15)
BUN: 15 mg/dL (ref 8–23)
CO2: 25 mmol/L (ref 22–32)
Calcium: 8.6 mg/dL — ABNORMAL LOW (ref 8.9–10.3)
Chloride: 101 mmol/L (ref 98–111)
Creatinine, Ser: 0.78 mg/dL (ref 0.61–1.24)
GFR calc Af Amer: >60 mL/min (ref >60)
GFR calc non Af Amer: >60 mL/min (ref >60)
Glucose, Bld: 118 mg/dL — ABNORMAL HIGH (ref 70–99)
Potassium: 3.4 mmol/L — ABNORMAL LOW (ref 3.5–5.1)
Sodium: 138 mmol/L (ref 135–145)
Total Bilirubin: 0.8 mg/dL (ref 0.3–1.2)
Total Protein: 6.7 g/dL (ref 6.5–8.1)

## 2019-11-01 LAB — LIPASE, BLOOD: Lipase: 26 U/L (ref 11–51)

## 2019-11-01 LAB — LACTIC ACID, PLASMA
Lactic Acid, Venous: 2.9 mmol/L (ref 0.5–1.9)
Lactic Acid, Venous: 3 mmol/L (ref 0.5–1.9)

## 2019-11-01 LAB — TROPONIN I (HIGH SENSITIVITY)
Troponin I (High Sensitivity): 3 ng/L (ref <18)
Troponin I (High Sensitivity): 2 ng/L (ref <18)

## 2019-11-01 MED ORDER — IOHEXOL 300 MG/ML  SOLN
100.0000 mL | Freq: Once | INTRAMUSCULAR | Status: AC | PRN
  Administered 2019-11-01: 100 mL via INTRAVENOUS

## 2019-11-01 MED ORDER — SODIUM CHLORIDE 0.9 % IV BOLUS
1000.0000 mL | Freq: Once | INTRAVENOUS | Status: AC
  Administered 2019-11-01: 1000 mL via INTRAVENOUS

## 2019-11-01 NOTE — ED Notes (Signed)
Date and time results received: 11/01/19 21:46 (use smartphrase ".now" to insert current time)  Test: Lactic Acid  Critical Value: 2.9  Name of Provider Notified: Dr. Allie Bossier   Orders Received? Or Actions Taken?: Continue to monitor and await new orders.

## 2019-11-01 NOTE — Telephone Encounter (Signed)
I was called by Dr. Darl Householder about this patient who came to the ER with a syncopal episode.  He initially complained of left sided and epigastric pain which has completely resolved.  He had a CT scan that shows a 4.7 cm AAA without any concerning findings of rupture.  HE has dementia and other chronic medical problems.  I reviewed his CT scan.  He does not have a infrarenal neck and so he is not a stent graft candidate.  Since his pain has completely resolved. I doubt that his aneurysm was the source.  I would not recommend open repair at this time.  I have recommended a follow up with me in 1 month with a CTA.  Annamarie Major

## 2019-11-01 NOTE — ED Notes (Signed)
PT aware of urine sample urinal in hand

## 2019-11-01 NOTE — ED Triage Notes (Signed)
Pt BIB EMS from home. Pt started taking a new antibiotic today around 6pm and after a few hours he had epigastric pain, SHOB, and dizziness. Pt is more alert now than initially with EMS. Pt has hx of dementia.   20G LFA BP 68/56 to 130/72 after 552mL fluid HR 64 96% RA CBG 145

## 2019-11-01 NOTE — ED Provider Notes (Signed)
[facility] DEPT Provider Note   CSN: [id number] Arrival date & time: 11/01/19  2004     History Chief Complaint  Patient presents with  . Dizziness  . Hypotension    [name] [name] is a 85 y.o. male hx of DM, HTN, MI, PAD, presenting with abdominal pain, near syncope. Patient gets recurrent pneumonias and apparently has some cough.  Wife called the doctor and he was prescribed a course of moxifloxacin. He took 1 dose about 2 hours later had left-sided and epigastric abdominal pain. He then felt very lightheaded and dizzy. Patient felt like he was going to pass out. EMS got there his blood pressure was in the 60s. He was given 500 cc bolus and is blood pressure went up to 130/72 per EMS. Patient denies any chest pain or shortness of breath. Patient is demented and unable to give much history.  The history is provided by the patient and a relative.   Level V caveat- dementia     Past Medical History:  Diagnosis Date  . Acid reflux 09/21/2016  . ALLERGIC RHINITIS 11/15/2008  . Asthma   . Bladder outlet obstruction 03/22/2013  . BPH (benign prostatic hypertrophy)   . Chronic airway obstruction (HCC) 11/14/2008   Methacholine challenge test for 08/04/13 while on GERD RX > neg  Overview:  Overview:  Methacholine challenge test for 08/04/13 while on GERD RX > neg  Last Assessment & Plan:  Please continue your pantoprazole 40mg  in the evening Continue your your loratadine daily You could consider increasing your fluticasone nasal spray to twice a day during the allergy months.  Use your albuterol 2 puffs as n  . Diabetes mellitus, type 2 ([place]) 09/21/2016  . Echocardiogram    Echo 12/18: EF 55-60, normal wall motion, grade 1 diastolic dysfunction, mild MR, mildly dilated RV  . ED (erectile dysfunction) of organic origin 09/21/2012  . Essential hypertension 11/15/2008  . Felon of finger of right hand 04/06/2016  . Gait difficulty   . History of hypoxemic  respiratory failure 09/26/2017  . History of MI (myocardial infarction) 11/15/2008   Reported hx per patient - Patient did not have heart cath/PCI  . History of orthostatic hypotension   . Holter Monitor    Holter 8/18:  NSR, Frequent PVCs, Occasional PACs  . Hyperlipidemia   . Increased frequency of urination 05/27/2017  . Leg weakness 04/13/2011  . Musculoskeletal symptoms referable to limbs 04/13/2011  . Nuclear stress test    Nuclear stress test 12/19: EF 57, no ischemia, +bowel atten artifact, Low Risk  . OSA (obstructive sleep apnea) 05/10/2015  . Paronychia of left middle finger 04/06/2016  . Phimosis 10/08/2016   Overview:  Added automatically from request for surgery [id number]  . Prostate cancer ([place]) 01/02/2009  . Rheumatic fever   . Right bundle branch block 03/17/2013  . Sleep apnea   . Syncope 06/01/2017    Patient Active Problem List   Diagnosis Date Noted  . Peripheral arterial disease ([place]) 10/24/2019  . Pain due to onychomycosis of toenails of both feet 04/12/2019  . Frequent falls 03/09/2019  . Partial symptomatic epilepsy with complex partial seizures, not intractable, without status epilepticus ([place]) 09/20/2018  . Coronary artery calcification seen on CT scan 09/06/2018  . Aphasia 08/16/2018  . Gait abnormality 08/16/2018  . Subdural hematoma ([place]) 08/16/2018  . Erroneous encounter - disregard 10/05/2017  . Acute bronchitis 09/26/2017  . Acute hypoxemic respiratory failure ([place]) 09/26/2017  . Syncope 06/01/2017  .  Increased frequency of urination 05/27/2017  . Phimosis 10/08/2016  . Asthma without status asthmaticus 09/21/2016  . Asthma attack 09/21/2016  . Diabetes mellitus, type 2 ([place]) 09/21/2016  . Acid reflux 09/21/2016  . Personal history of prostate cancer 06/18/2016  . Felon of finger of right hand 04/06/2016  . Paronychia of left middle finger 04/06/2016  . OSA (obstructive sleep apnea) 05/10/2015  . Bladder outlet obstruction 03/22/2013  . Right  bundle branch block 03/17/2013  . Incomplete emptying of bladder 09/22/2012  . ED (erectile dysfunction) of organic origin 09/21/2012  . Leg weakness 04/13/2011  . Musculoskeletal symptoms referable to limbs 04/13/2011  . CA of prostate ([place]) 01/02/2009  . Cough 11/20/2008  . Hyperlipidemia 11/15/2008  . Benign essential HTN 11/15/2008  . ALLERGIC RHINITIS 11/15/2008  . Chronic airway obstruction ([place]) 11/14/2008    Past Surgical History:  Procedure Laterality Date  . APPENDECTOMY  1954  . CATARACT EXTRACTION Left 03/25/2017  . HERNIA REPAIR    . INCISION AND DRAINAGE ABSCESS Right 04/02/2016   Procedure: INCISION AND DRAINAGE ABSCESS;  Surgeon: Leanora Cover, MD;  Location: [facility];  Service: Orthopedics;  Laterality: Right;  Marland Kitchen VASECTOMY         Family History  Problem Relation Age of Onset  . Heart failure Mother   . Stroke Father        age 22  . Arthritis Other   . Diabetes Other   . Heart disease Other   . Hyperlipidemia Other   . Kidney disease Other   . Obesity Other   . Stroke Brother     Social History   Tobacco Use  . Smoking status: Former Smoker    Packs/day: 1.00    Years: 30.00    Pack years: 30.00    Types: Cigarettes, Pipe    Quit date: 10/19/2012    Years since quitting: 7.0  . Smokeless tobacco: Never Used  . Tobacco comment: 1 pipe every 3 days, quit smoking cig 12 yrs ago 07/06/13  Substance Use Topics  . Alcohol use: No    Comment: no etoh in 12 years  . Drug use: No    Home Medications Prior to Admission medications   Medication Sig Start Date End Date Taking? Authorizing Provider  acetaminophen (TYLENOL) 500 MG tablet Take 500 mg by mouth 2 (two) times daily as needed for moderate pain.    Yes [provider]  albuterol (PROVENTIL) (2.5 MG/3ML) 0.083% nebulizer solution Take 2.5 mg by nebulization daily as needed.  12/04/18  Yes [provider]  ALPRAZolam Duanne Moron) 0.5 MG tablet Take 0.5 tablets by mouth  2 (two) times daily. 03/07/18  Yes [provider]  aspirin EC 81 MG tablet Take 1 tablet by mouth daily.   Yes [provider]  bisoprolol (ZEBETA) 5 MG tablet Take 1 tablet (5 mg total) by mouth daily. 02/02/19  Yes Nahser, Wonda Cheng, MD  escitalopram (LEXAPRO) 20 MG tablet Take 20 mg by mouth daily. 10/04/19  Yes [provider]  finasteride (PROSCAR) 5 MG tablet Take 5 mg by mouth daily.     Yes [provider]  latanoprost (XALATAN) 0.005 % ophthalmic solution Place 1 drop into both eyes daily. 08/07/16  Yes [provider]  levETIRAcetam (KEPPRA) 750 MG tablet TAKE 1 TABLET BY MOUTH TWICE A DAY Patient taking differently: Take 750 mg by mouth 2 (two) times daily.  08/13/19  Yes Marcial Pacas, MD  losartan (COZAAR) 25 MG tablet  Take 1 tablet (25 mg total) by mouth daily. 09/05/19  Yes Sande Rives E, PA-C  memantine (NAMENDA) 10 MG tablet Take 1 tablet (10 mg total) by mouth 2 (two) times daily. 09/25/19  Yes Suzzanne Cloud, NP  metFORMIN (GLUCOPHAGE) 500 MG tablet Take 1,000 mg by mouth at bedtime.    Yes [provider]  montelukast (SINGULAIR) 10 MG tablet Take 10 mg by mouth at bedtime.   Yes [provider]  moxifloxacin (AVELOX) 400 MG tablet Take 400 mg by mouth daily at 8 pm.   Yes [provider]  Multiple Vitamins-Minerals (PRESERVISION AREDS 2+MULTI VIT PO) Take 1 tablet by mouth 2 (two) times daily.   Yes [provider]  nitroGLYCERIN (NITROSTAT) 0.4 MG SL tablet PLACE 1 TABLET UNDER THE TONGUE EVERY 5 MINS AS NEEDED FOR UP TO 3 DOSES IF PAIN CONTINUES,CALL 911 07/31/19  Yes Nahser, Wonda Cheng, MD  pantoprazole (PROTONIX) 40 MG tablet TAKE 1 TABLET BY MOUTH DAILY. Patient taking differently: Take 40 mg by mouth daily.  01/13/16  Yes Collene Gobble, MD  potassium chloride SA (K-DUR) 20 MEQ tablet Take 20 mEq by mouth daily.   Yes [provider]  rosuvastatin (CRESTOR) 10 MG tablet TAKE 1 TABLET BY  MOUTH EVERY DAY Patient taking differently: Take 10 mg by mouth daily.  05/25/19  Yes Nahser, Wonda Cheng, MD  triamterene-hydrochlorothiazide (DYAZIDE) 37.5-25 MG capsule TAKE 1 CAPSULE BY MOUTH EVERY MORNING Patient taking differently: Take 1 capsule by mouth daily.  04/04/19  Yes Nahser, Wonda Cheng, MD  glucose blood (TRUETEST TEST) test strip  08/04/13   [provider]  magnesium oxide (MAG-OX) 400 MG tablet Take 400 mg by mouth daily.    [provider]    Allergies    Doxycycline monohydrate, Penicillins, Fluticasone-salmeterol, Levetiracetam, Lisinopril, Prednisone, Shark cartilage, Tetracyclines & related, and Tetracycline  Review of Systems   Review of Systems  Neurological: Positive for dizziness.  All other systems reviewed and are negative.   Physical Exam Updated Vital Signs BP 127/74   Pulse 63   Temp (!) 97.3 F (36.3 C) (Oral)   Resp 19   Ht 5\' 10"  (1.778 m)   Wt 85.7 kg   SpO2 98%   BMI 27.11 kg/m   Physical Exam Vitals and nursing note reviewed.  Constitutional:      Comments: Chronically ill   HENT:     Head: Normocephalic.     Mouth/Throat:     Mouth: Mucous membranes are moist.  Eyes:     Extraocular Movements: Extraocular movements intact.     Pupils: Pupils are equal, round, and reactive to light.  Cardiovascular:     Rate and Rhythm: Normal rate and regular rhythm.     Pulses: Normal pulses.     Heart sounds: Normal heart sounds.  Pulmonary:     Effort: Pulmonary effort is normal.     Breath sounds: Normal breath sounds.  Abdominal:     General: Abdomen is flat.     Palpations: Abdomen is soft.     Comments: Mild LUQ tenderness   Musculoskeletal:        General: Normal range of motion.     Cervical back: Normal range of motion.  Skin:    General: Skin is warm.     Capillary Refill: Capillary refill takes less than 2 seconds.  Neurological:     General: No focal deficit present.     Mental Status: He is alert.  Comments: A & O x 2. Nl strength throughout   Psychiatric:        Mood and Affect: Mood normal.        Behavior: Behavior normal.     ED Results / Procedures / Treatments   Labs (all labs ordered are listed, but only abnormal results are displayed) Labs Reviewed  COMPREHENSIVE METABOLIC PANEL - Abnormal; Notable for the following components:      Result Value   Potassium 3.4 (*)    Glucose, Bld 118 (*)    Calcium 8.6 (*)    All other components within normal limits  LACTIC ACID, PLASMA - Abnormal; Notable for the following components:   Lactic Acid, Venous 2.9 (*)    All other components within normal limits  I-STAT CHEM 8, ED - Abnormal; Notable for the following components:   Potassium 3.4 (*)    Glucose, Bld 117 (*)    Calcium, Ion 1.07 (*)    All other components within normal limits  CULTURE, BLOOD (ROUTINE X 2)  CULTURE, BLOOD (ROUTINE X 2)  URINE CULTURE  LIPASE, BLOOD  CBC WITH DIFFERENTIAL/PLATELET  CBC WITH DIFFERENTIAL/PLATELET  LACTIC ACID, PLASMA  URINALYSIS, ROUTINE W REFLEX MICROSCOPIC  TROPONIN I (HIGH SENSITIVITY)  TROPONIN I (HIGH SENSITIVITY)    EKG EKG Interpretation  Date/Time:  Wednesday November 01 2019 20:26:51 EST Ventricular Rate:  59 PR Interval:    QRS Duration: 150 QT Interval:  484 QTC Calculation: 480 R Axis:   -83 Text Interpretation: Sinus or ectopic atrial rhythm RBBB and LAFB No significant change since last tracing Confirmed by Wandra Arthurs [phone]) on 11/01/2019 8:59:41 PM   Radiology CT ABDOMEN PELVIS W CONTRAST  Result Date: 11/01/2019 CLINICAL DATA:  Abdominal distension, epigastric pain EXAM: CT ABDOMEN AND PELVIS WITH CONTRAST TECHNIQUE: Multidetector CT imaging of the abdomen and pelvis was performed using the standard protocol following bolus administration of intravenous contrast. CONTRAST:  1101mL OMNIPAQUE IOHEXOL 300 MG/ML  SOLN COMPARISON:  MRI lumbar spine July 18, 2016 and abdominal CT May 31, 2006 FINDINGS:  Lower chest: The visualized heart size within normal limits. No pericardial fluid/thickening. No hiatal hernia. The visualized portions of the lungs are clear. Hepatobiliary: The liver is normal in density without focal abnormality.The main portal vein is patent. No evidence of calcified gallstones, gallbladder wall thickening or biliary dilatation. Pancreas: Unremarkable. No pancreatic ductal dilatation or surrounding inflammatory changes. Spleen: Normal in size without focal abnormality. Adrenals/Urinary Tract: Both adrenal glands appear normal. Multiple bilateral low-density lesions are seen within both kidneys the largest within the right kidney measuring 3 cm and likely simple renal cyst. No hydronephrosis. No renal or collecting system calculi. Stomach/Bowel: The stomach and small bowel are normal in appearance. There is a moderate amount colonic stool. Scattered colonic diverticula without diverticulitis. The appendix is normal in appearance. Vascular/Lymphatic: There are no enlarged mesenteric, retroperitoneal, or pelvic lymph nodes. There is a focal infrarenal abdominal aortic aneurysm measuring 4.7 cm in maximum diameter and previously 3.7 cm on prior MRI. There is calcified and noncalcified plaque. There is dense atherosclerosis seen throughout the aorta and bilateral iliacs. Reproductive: Radiation prostate seeds are noted. Other: No evidence of abdominal wall mass or hernia. Musculoskeletal: No acute or significant osseous findings. IMPRESSION: 1. Scattered colonic diverticula without diverticulitis. Moderate amount of colonic stool. 2. Infrarenal abdominal aortic aneurysm measuring 4.7 cm Recommend followup by abdomen and pelvis CTA in 6 months,surgery referral/consultation if not already obtained. This recommendation follows ACR consensus guidelines: White  Paper of the ACR Incidental Findings Committee II on Vascular Findings. J Am Coll Radiol 2013; 10:789-794. Aortic aneurysm NOS (ICD10-I71.9) 3.   Aortic Atherosclerosis (ICD10-I70.0). Electronically Signed   By: Prudencio Pair M.D.   On: 11/01/2019 22:14   DG Chest Port 1 View  Result Date: 11/01/2019 CLINICAL DATA:  85 year old male with weakness and near syncope. EXAM: PORTABLE CHEST 1 VIEW COMPARISON:  Chest radiographs 02/14/2019 and earlier. FINDINGS: Portable AP semi upright view at 2112 hours. Low lung volumes. Stable cardiac size and mediastinal contours. Visualized tracheal air column is within normal limits. No pneumothorax, consolidation or pleural effusion. Increased pulmonary vascularity and/or crowding of lung markings. Paucity of bowel gas in the upper abdomen. No acute osseous abnormality identified. IMPRESSION: Lower lung volumes with increased pulmonary interstitial markings which might relate to atelectasis, interstitial edema, or viral/atypical respiratory infection. Electronically Signed   By: Genevie Ann M.D.   On: 11/01/2019 21:30    Procedures Procedures (including critical care time)  Medications Ordered in ED Medications  iohexol (OMNIPAQUE) 300 MG/ML solution 100 mL (100 mLs Intravenous Contrast Given 11/01/19 2150)  sodium chloride 0.9 % bolus 1,000 mL (1,000 mLs Intravenous New Bag/Given (Non-Interop) 11/01/19 2309)    ED Course  I have reviewed the triage vital signs and the nursing notes.  Pertinent labs & imaging results that were available during my care of the patient were reviewed by me and considered in my medical decision making (see chart for details).    MDM Rules/Calculators/A&P                      SHADOW ENSING is a 85 y.o. male who presented with dizziness, epigastric pain, near syncope. Patient does have a history of orthostatic hypotension . I wonder if he had a vasovagal event. Had abdominal pain during that time . Patient did take a dose of antibiotics right before but has no other signs of allergic reaction currently. Will do sepsis work-up and get labs, lactate, cultures, CT abdomen pelvis and  chest x-ray.  10:30 PM CT showed 4.7 cm AAA .  There is no sign of rupture .  I talked to Dr. Trula Slade from vascular surgery .  He reviewed the CT and did not think that this is a contained rupture.  Patient does not have any abdominal pain currently.  Dr. Trula Slade said that if he is not having abdominal pain right now, he does not think that patient is symptomatic from his aneurysm.  He will schedule follow-up for him in the office.  I wonder if he has a vasovagal event.  His white blood cell count is normal.  His chest x-ray is clear with no signs of pneumonia. Initial lactate is 2.9. Will give IVF and recheck lactate.   12:57 AM His repeat lactate is pending. If stable around 3, anticipate discharge with vascular follow up. Or else can get more IVF and recheck. Signed out to Dr. Leonette Monarch in the ED.     Final Clinical Impression(s) / ED Diagnoses Final diagnoses:  None    Rx / DC Orders ED Discharge Orders    None       Drenda Freeze, MD 11/02/19 [phone]

## 2019-11-02 ENCOUNTER — Telehealth: Payer: Self-pay | Admitting: Cardiovascular Disease

## 2019-11-02 LAB — URINALYSIS, ROUTINE W REFLEX MICROSCOPIC
Bilirubin Urine: NEGATIVE
Glucose, UA: NEGATIVE mg/dL
Hgb urine dipstick: NEGATIVE
Ketones, ur: NEGATIVE mg/dL
Leukocytes,Ua: NEGATIVE
Nitrite: NEGATIVE
Protein, ur: NEGATIVE mg/dL
Specific Gravity, Urine: >1.046 — ABNORMAL HIGH (ref 1.005–1.030)
pH: 7 (ref 5.0–8.0)

## 2019-11-02 LAB — LACTIC ACID, PLASMA: Lactic Acid, Venous: 2.5 mmol/L (ref 0.5–1.9)

## 2019-11-02 NOTE — ED Provider Notes (Signed)
I assumed care of this patient from Dr. Darl Householder.  Please see their note for further details of Hx, PE.  Briefly patient is a 84 y.o. male who presented dizziness, abd pain, and near syncope with reassuring work up pending repeat lactic acid.   Repeat lactic acid down trending after IVF. The patient is safe for discharge with strict return precautions.        Fatima Blank, MD 11/02/19 [phone]

## 2019-11-02 NOTE — Telephone Encounter (Signed)
Returned call to patient's wife (and patient is also on the phone) who called to ask if Dr. Acie Fredrickson agrees with ED plan for patient to see someone about his AAA. I reviewed the patient's chart and read the instructions from Dr. Trula Slade to wife. She verbalized understanding and wrote down the appointment date and time. I advised that Dr. Acie Fredrickson would agree with having patient see any of the doctors at [facility]. She thanked me for the call.

## 2019-11-02 NOTE — Discharge Instructions (Signed)
Stop taking your moxifloxacin   You have an aneurysm in your abdomen. See vascular surgery for follow up   Stay hydrated   Return to ER if you have worse abdominal pain, vomiting, passing out, fever, trouble breathing

## 2019-11-02 NOTE — Telephone Encounter (Signed)
New Message   Pts wife is calling to speak with Dr Lanny Hurst nurse.  She says its in regards to his recent hospital visit     Please call back

## 2019-11-03 LAB — URINE CULTURE: Culture: <10000 — AB

## 2019-11-07 LAB — CULTURE, BLOOD (ROUTINE X 2)
Culture: NO GROWTH
Culture: NO GROWTH
Special Requests: ADEQUATE
Special Requests: ADEQUATE

## 2019-11-14 ENCOUNTER — Other Ambulatory Visit: Payer: Self-pay

## 2019-11-14 DIAGNOSIS — I998 Other disorder of circulatory system: Secondary | ICD-10-CM

## 2019-12-05 ENCOUNTER — Other Ambulatory Visit: Payer: Self-pay

## 2019-12-05 ENCOUNTER — Other Ambulatory Visit: Payer: Medicare Other

## 2019-12-05 ENCOUNTER — Ambulatory Visit
Admission: RE | Admit: 2019-12-05 | Discharge: 2019-12-05 | Disposition: A | Discharge status: Home / Self Care | Payer: Medicare Other | Source: Ambulatory Visit | Attending: Surgery | Admitting: Surgery

## 2019-12-05 DIAGNOSIS — I998 Other disorder of circulatory system: Secondary | ICD-10-CM

## 2019-12-05 MED ORDER — IOPAMIDOL (ISOVUE-370) INJECTION 76%
75.0000 mL | Freq: Once | INTRAVENOUS | Status: AC | PRN
  Administered 2019-12-05: 11:00:00 75 mL via INTRAVENOUS

## 2019-12-08 ENCOUNTER — Telehealth (HOSPITAL_COMMUNITY): Payer: Self-pay

## 2019-12-08 NOTE — Telephone Encounter (Signed)

## 2019-12-11 ENCOUNTER — Encounter: Payer: Self-pay | Admitting: Surgery

## 2019-12-11 ENCOUNTER — Encounter: Payer: Medicare Other | Admitting: Surgery

## 2019-12-11 ENCOUNTER — Other Ambulatory Visit: Payer: Self-pay

## 2019-12-11 ENCOUNTER — Ambulatory Visit (INDEPENDENT_AMBULATORY_CARE_PROVIDER_SITE_OTHER): Payer: Medicare Other | Admitting: Surgery

## 2019-12-11 VITALS — BP 118/66 | HR 65 | Temp 97.3°F | Resp 20 | Ht 70.0 in | Wt 188.0 lb

## 2019-12-11 DIAGNOSIS — I714 Abdominal aortic aneurysm, without rupture, unspecified: Secondary | ICD-10-CM

## 2019-12-11 NOTE — Progress Notes (Signed)
[facility] [place]  Patient name: [name] [name] MRN: [id number] DOB: Sep 20, 1935 Sex: male   REASON FOR VISIT:    Follow up  HISOTRY OF PRESENT ILLNESS:    [name] [name] is a 85 y.o. male who presented to the ED on 11-01-2019 with abdominal pain and near syncope.  He was treated with fluids.  Shortly thereafter, his abdominal pain completely resolved.  He did have a CT scan that showed a 4.7 cm aneurysm.  I did not think his aneurysm was the source of his pain.  He is here today for further follow-up.  Patient is medically managed for hypertension.  He takes a statin for hypercholesterolemia.  He does have diabetes as well as dementia.  He has had a heart attack in the past.   PAST MEDICAL HISTORY:   Past Medical History:  Diagnosis Date  . Acid reflux 09/21/2016  . ALLERGIC RHINITIS 11/15/2008  . Asthma   . Bladder outlet obstruction 03/22/2013  . BPH (benign prostatic hypertrophy)   . Chronic airway obstruction (HCC) 11/14/2008   Methacholine challenge test for 08/04/13 while on GERD RX > neg  Overview:  Overview:  Methacholine challenge test for 08/04/13 while on GERD RX > neg  Last Assessment & Plan:  Please continue your pantoprazole 40mg  in the evening Continue your your loratadine daily You could consider increasing your fluticasone nasal spray to twice a day during the allergy months.  Use your albuterol 2 puffs as n  . Diabetes mellitus, type 2 ([place]) 09/21/2016  . Echocardiogram    Echo 12/18: EF 55-60, normal wall motion, grade 1 diastolic dysfunction, mild MR, mildly dilated RV  . ED (erectile dysfunction) of organic origin 09/21/2012  . Essential hypertension 11/15/2008  . Felon of finger of right hand 04/06/2016  . Gait difficulty   . History of hypoxemic respiratory failure 09/26/2017  . History of MI (myocardial infarction) 11/15/2008   Reported hx per patient - Patient did not have heart cath/PCI  . History of  orthostatic hypotension   . Holter Monitor    Holter 8/18:  NSR, Frequent PVCs, Occasional PACs  . Hyperlipidemia   . Increased frequency of urination 05/27/2017  . Leg weakness 04/13/2011  . Musculoskeletal symptoms referable to limbs 04/13/2011  . Nuclear stress test    Nuclear stress test 12/19: EF 57, no ischemia, +bowel atten artifact, Low Risk  . OSA (obstructive sleep apnea) 05/10/2015  . Paronychia of left middle finger 04/06/2016  . Phimosis 10/08/2016   Overview:  Added automatically from request for surgery [id number]  . Prostate cancer ([place]) 01/02/2009  . Rheumatic fever   . Right bundle branch block 03/17/2013  . Sleep apnea   . Syncope 06/01/2017     FAMILY HISTORY:   Family History  Problem Relation Age of Onset  . Heart failure Mother   . Stroke Father        age 110  . Arthritis Other   . Diabetes Other   . Heart disease Other   . Hyperlipidemia Other   . Kidney disease Other   . Obesity Other   . Stroke Brother     SOCIAL HISTORY:   Social History   Tobacco Use  . Smoking status: Former Smoker    Packs/day: 1.00    Years: 30.00    Pack years: 30.00    Types: Cigarettes, Pipe    Quit date: 10/19/2012    Years since quitting: 7.1  . Smokeless tobacco:  Never Used  . Tobacco comment: 1 pipe every 3 days, quit smoking cig 12 yrs ago 07/06/13  Substance Use Topics  . Alcohol use: No    Comment: no etoh in 12 years     ALLERGIES:   Allergies  Allergen Reactions  . Doxycycline Monohydrate Hives  . Penicillins Hives    Has patient had a PCN reaction causing immediate rash, facial/tongue/throat swelling, SOB or lightheadedness with hypotension: No Has patient had a PCN reaction causing severe rash involving mucus membranes or skin necrosis: Yes Has patient had a PCN reaction that required hospitalization: No Has patient had a PCN reaction occurring within the last 10 years: No If all of the above answers are "NO", then may proceed with Cephalosporin use.     Marland Kitchen Fluticasone-Salmeterol     REACTION: sensation of throat closing REACTION: sensation of throat closing  . Levetiracetam Other (See Comments)    aggitation  . Lisinopril Cough  . Prednisone     Per [id number] agitation Per [id number] agitation  . Shark Cartilage   . Tetracyclines & Related Hives  . Tetracycline Rash     CURRENT MEDICATIONS:   Current Outpatient Medications  Medication Sig Dispense Refill  . acetaminophen (TYLENOL) 500 MG tablet Take 500 mg by mouth 2 (two) times daily as needed for moderate pain.     Marland Kitchen albuterol (PROVENTIL) (2.5 MG/3ML) 0.083% nebulizer solution Take 2.5 mg by nebulization daily as needed.     . ALPRAZolam (XANAX) 0.5 MG tablet Take 0.5 tablets by mouth 2 (two) times daily.    Marland Kitchen aspirin EC 81 MG tablet Take 1 tablet by mouth daily.    . bisoprolol (ZEBETA) 5 MG tablet Take 1 tablet (5 mg total) by mouth daily. 90 tablet 1  . escitalopram (LEXAPRO) 20 MG tablet Take 20 mg by mouth daily.    . finasteride (PROSCAR) 5 MG tablet Take 5 mg by mouth daily.      Marland Kitchen glucose blood (TRUETEST TEST) test strip     . latanoprost (XALATAN) 0.005 % ophthalmic solution Place 1 drop into both eyes daily.    Marland Kitchen levETIRAcetam (KEPPRA) 750 MG tablet TAKE 1 TABLET BY MOUTH TWICE A DAY (Patient taking differently: Take 750 mg by mouth 2 (two) times daily. ) 180 tablet 0  . losartan (COZAAR) 25 MG tablet Take 1 tablet (25 mg total) by mouth daily.    . magnesium oxide (MAG-OX) 400 MG tablet Take 400 mg by mouth daily.    . memantine (NAMENDA) 10 MG tablet Take 1 tablet (10 mg total) by mouth 2 (two) times daily. 180 tablet 4  . metFORMIN (GLUCOPHAGE) 500 MG tablet Take 1,000 mg by mouth at bedtime.     . montelukast (SINGULAIR) 10 MG tablet Take 10 mg by mouth at bedtime.    . moxifloxacin (AVELOX) 400 MG tablet Take 400 mg by mouth daily at 8 pm.    . Multiple Vitamins-Minerals (PRESERVISION AREDS 2+MULTI VIT PO) Take 1 tablet by mouth 2 (two) times daily.    .  nitroGLYCERIN (NITROSTAT) 0.4 MG SL tablet PLACE 1 TABLET UNDER THE TONGUE EVERY 5 MINS AS NEEDED FOR UP TO 3 DOSES IF PAIN CONTINUES,CALL 911 25 tablet 10  . pantoprazole (PROTONIX) 40 MG tablet TAKE 1 TABLET BY MOUTH DAILY. (Patient taking differently: Take 40 mg by mouth daily. ) 90 tablet 0  . potassium chloride SA (K-DUR) 20 MEQ tablet Take 20 mEq by mouth daily.    . rosuvastatin (CRESTOR)  10 MG tablet TAKE 1 TABLET BY MOUTH EVERY DAY (Patient taking differently: Take 10 mg by mouth daily. ) 90 tablet 3  . triamterene-hydrochlorothiazide (DYAZIDE) 37.5-25 MG capsule TAKE 1 CAPSULE BY MOUTH EVERY MORNING (Patient taking differently: Take 1 capsule by mouth daily. ) 90 capsule 3   No current facility-administered medications for this visit.    REVIEW OF SYSTEMS:   [X]  denotes positive finding, [ ]  denotes negative finding Cardiac  Comments:  Chest pain or chest pressure:    Shortness of breath upon exertion:    Short of breath when lying flat: x   Irregular heart rhythm:        Vascular    Pain in calf, thigh, or hip brought on by ambulation:    Pain in feet at night that wakes you up from your sleep:     Blood clot in your veins:    Leg swelling:         Pulmonary    Oxygen at home:    Productive cough:     Wheezing:         Neurologic    Sudden weakness in arms or legs:     Sudden numbness in arms or legs:     Sudden onset of difficulty speaking or slurred speech:    Temporary loss of vision in one eye:     Problems with dizziness:         Gastrointestinal    Blood in stool:     Vomited blood:         Genitourinary    Burning when urinating:     Blood in urine:        Psychiatric    Major depression:         Hematologic    Bleeding problems:    Problems with blood clotting too easily:        Skin    Rashes or ulcers:        Constitutional    Fever or chills:      PHYSICAL EXAM:   There were no vitals filed for this visit.  GENERAL: The patient is a  well-nourished male, in no acute distress. The vital signs are documented above. CARDIAC: There is a regular rate and rhythm.  PULMONARY: Non-labored respirations ABDOMEN: Soft and non-tender MUSCULOSKELETAL: There are no major deformities or cyanosis. NEUROLOGIC: No focal weakness or paresthesias are detected. SKIN: There are no ulcers or rashes noted. PSYCHIATRIC: The patient has a normal affect.  STUDIES:   I have reviewed his CT scan with the following findings:  1. 0.9 cm penetrating atheromatous ulcer in the distal arch and proximal descending segment, without complicating features. 2. Bilobed infrarenal aortic aneurysm, 3.9 cm maximum diameter, previously 4 x 3.6 cm. Recommend followup by ultrasound in 2 years. This recommendation follows ACR consensus guidelines: White Paper of the ACR Incidental Findings Committee II on Vascular Findings. J Am Coll Radiol 2013; 10:789-794. 3. Descending and sigmoid diverticulosis. 4. Coronary calcifications. The severity of this disease and any potential stenosis cannot be assessed on this non-gated CT examination. MEDICAL ISSUES:   Abdominal aneurysm: CT angio today shows maximal diameter 4 cm.  He does not have a infrarenal neck and so I do not think he will be a candidate for endovascular repair.  Therefore, I would not consider repair until his aneurysm reaches 5.5 cm.  At that time he will probably not be a surgical candidate.  However we will continue to follow  him.  We will get an ultrasound in 1 year.    Leia Alf, MD, FACS [facility] [phone] Pager [phone]

## 2019-12-12 ENCOUNTER — Other Ambulatory Visit: Payer: Medicare Other

## 2019-12-12 ENCOUNTER — Other Ambulatory Visit: Payer: Self-pay | Admitting: *Deleted

## 2019-12-12 DIAGNOSIS — I714 Abdominal aortic aneurysm, without rupture, unspecified: Secondary | ICD-10-CM

## 2019-12-20 NOTE — Progress Notes (Signed)
I have reviewed and agreed above plan. 

## 2020-01-03 ENCOUNTER — Ambulatory Visit: Payer: Medicare Other | Admitting: Podiatry

## 2020-01-08 ENCOUNTER — Encounter: Payer: Medicare Other | Admitting: Surgery

## 2020-01-11 ENCOUNTER — Other Ambulatory Visit: Payer: Self-pay

## 2020-01-11 ENCOUNTER — Encounter: Payer: Self-pay | Admitting: Cardiovascular Disease

## 2020-01-11 ENCOUNTER — Ambulatory Visit: Payer: Medicare Other | Admitting: Cardiovascular Disease

## 2020-01-11 ENCOUNTER — Encounter (INDEPENDENT_AMBULATORY_CARE_PROVIDER_SITE_OTHER): Payer: Self-pay

## 2020-01-11 VITALS — BP 112/66 | HR 60 | Ht 70.0 in | Wt 185.0 lb

## 2020-01-11 DIAGNOSIS — I10 Essential (primary) hypertension: Secondary | ICD-10-CM

## 2020-01-11 MED ORDER — BISOPROLOL FUMARATE 5 MG PO TABS: 2.5000 mg | ORAL_TABLET | Freq: Every day | ORAL | 3 refills | Status: AC
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
## 2020-01-11 NOTE — Patient Instructions (Addendum)
Medication Instructions:  Your physician has recommended you make the following change in your medication:  DECREASE Bisoprolol (Zebeta) to 2.5 mg once daily STOP Triamterine/HCTZ (Maxzide)  *If you need a refill on your cardiac medications before your next appointment, please call your pharmacy*   Lab Work: None Ordered If you have labs (blood work) drawn today and your tests are completely normal, you will receive your results only by: Marland Kitchen MyChart Message (if you have MyChart) OR . A paper copy in the mail If you have any lab test that is abnormal or we need to change your treatment, we will call you to review the results.   Testing/Procedures: None Ordered   Follow-Up: At [facility], you and your health needs are our priority.  As part of our continuing mission to provide you with exceptional heart care, we have created designated Provider Care Teams.  These Care Teams include your primary Cardiologist (physician) and Advanced Practice Providers (APPs -  Physician Assistants and Nurse Practitioners) who all work together to provide you with the care you need, when you need it.  We recommend signing up for the patient portal called "MyChart".  Sign up information is provided on this After Visit Summary.  MyChart is used to connect with patients for Virtual Visits (Telemedicine).  Patients are able to view lab/test results, encounter notes, upcoming appointments, etc.  Non-urgent messages can be sent to your provider as well.   To learn more about what you can do with MyChart, go to [email or web address].    Your next appointment:   3 month(s) on June 29  The format for your next appointment:   Either In Person or Virtual  Provider:   Richardson Dopp, PA-C

## 2020-02-06 ENCOUNTER — Ambulatory Visit: Payer: Medicare Other | Admitting: Neurology

## 2020-02-06 ENCOUNTER — Encounter: Payer: Self-pay | Admitting: Neurology

## 2020-02-06 ENCOUNTER — Telehealth: Payer: Self-pay | Admitting: Neurology

## 2020-02-06 ENCOUNTER — Other Ambulatory Visit: Payer: Self-pay

## 2020-02-06 VITALS — BP 124/78 | HR 64 | Temp 97.2°F

## 2020-02-06 DIAGNOSIS — F0391 Unspecified dementia with behavioral disturbance: Secondary | ICD-10-CM

## 2020-02-06 DIAGNOSIS — F03918 Unspecified dementia, unspecified severity, with other behavioral disturbance: Secondary | ICD-10-CM | POA: Insufficient documentation

## 2020-02-06 DIAGNOSIS — R269 Unspecified abnormalities of gait and mobility: Secondary | ICD-10-CM | POA: Diagnosis not present

## 2020-02-06 MED ORDER — DIVALPROEX SODIUM 125 MG PO CSDR: 375.0000 mg | DELAYED_RELEASE_CAPSULE | Freq: Two times a day (BID) | ORAL | 11 refills | Status: DC

## 2020-02-06 NOTE — Telephone Encounter (Signed)
[employer] medicare order sent to GI. No auth they will reach out to the patient to schedule.  
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
## 2020-02-17 ENCOUNTER — Emergency Department (HOSPITAL_COMMUNITY)
Admission: EM | Admit: 2020-02-17 | Discharge: 2020-02-17 | Disposition: A | Discharge status: Home / Self Care | Payer: Medicare Other | Attending: Emergency Medicine | Admitting: Emergency Medicine

## 2020-02-17 ENCOUNTER — Emergency Department (HOSPITAL_COMMUNITY): Payer: Medicare Other

## 2020-02-17 ENCOUNTER — Encounter (HOSPITAL_COMMUNITY): Payer: Self-pay

## 2020-02-17 ENCOUNTER — Other Ambulatory Visit: Payer: Self-pay

## 2020-02-17 DIAGNOSIS — Y9301 Activity, walking, marching and hiking: Secondary | ICD-10-CM | POA: Diagnosis not present

## 2020-02-17 DIAGNOSIS — W01198A Fall on same level from slipping, tripping and stumbling with subsequent striking against other object, initial encounter: Secondary | ICD-10-CM | POA: Insufficient documentation

## 2020-02-17 DIAGNOSIS — Z8546 Personal history of malignant neoplasm of prostate: Secondary | ICD-10-CM | POA: Diagnosis not present

## 2020-02-17 DIAGNOSIS — Z79899 Other long term (current) drug therapy: Secondary | ICD-10-CM | POA: Diagnosis not present

## 2020-02-17 DIAGNOSIS — Z7984 Long term (current) use of oral hypoglycemic drugs: Secondary | ICD-10-CM | POA: Insufficient documentation

## 2020-02-17 DIAGNOSIS — R262 Difficulty in walking, not elsewhere classified: Secondary | ICD-10-CM | POA: Diagnosis present

## 2020-02-17 DIAGNOSIS — J45909 Unspecified asthma, uncomplicated: Secondary | ICD-10-CM | POA: Diagnosis not present

## 2020-02-17 DIAGNOSIS — R296 Repeated falls: Secondary | ICD-10-CM | POA: Diagnosis not present

## 2020-02-17 DIAGNOSIS — Y998 Other external cause status: Secondary | ICD-10-CM | POA: Diagnosis not present

## 2020-02-17 DIAGNOSIS — I252 Old myocardial infarction: Secondary | ICD-10-CM | POA: Insufficient documentation

## 2020-02-17 DIAGNOSIS — Y92012 Bathroom of single-family (private) house as the place of occurrence of the external cause: Secondary | ICD-10-CM | POA: Diagnosis not present

## 2020-02-17 DIAGNOSIS — F0390 Unspecified dementia without behavioral disturbance: Secondary | ICD-10-CM | POA: Diagnosis not present

## 2020-02-17 DIAGNOSIS — Z87891 Personal history of nicotine dependence: Secondary | ICD-10-CM | POA: Insufficient documentation

## 2020-02-17 DIAGNOSIS — E119 Type 2 diabetes mellitus without complications: Secondary | ICD-10-CM | POA: Insufficient documentation

## 2020-02-17 DIAGNOSIS — Z7982 Long term (current) use of aspirin: Secondary | ICD-10-CM | POA: Diagnosis not present

## 2020-02-17 DIAGNOSIS — I10 Essential (primary) hypertension: Secondary | ICD-10-CM | POA: Insufficient documentation

## 2020-02-17 LAB — COMPREHENSIVE METABOLIC PANEL
ALT: 15 U/L (ref 0–44)
AST: 15 U/L (ref 15–41)
Albumin: 3.6 g/dL (ref 3.5–5.0)
Alkaline Phosphatase: 76 U/L (ref 38–126)
Anion gap: 8 (ref 5–15)
BUN: 9 mg/dL (ref 8–23)
CO2: 30 mmol/L (ref 22–32)
Calcium: 8.7 mg/dL — ABNORMAL LOW (ref 8.9–10.3)
Chloride: 102 mmol/L (ref 98–111)
Creatinine, Ser: 0.64 mg/dL (ref 0.61–1.24)
GFR calc Af Amer: >60 mL/min (ref >60)
GFR calc non Af Amer: >60 mL/min (ref >60)
Glucose, Bld: 110 mg/dL — ABNORMAL HIGH (ref 70–99)
Potassium: 4.1 mmol/L (ref 3.5–5.1)
Sodium: 140 mmol/L (ref 135–145)
Total Bilirubin: 0.9 mg/dL (ref 0.3–1.2)
Total Protein: 6.6 g/dL (ref 6.5–8.1)

## 2020-02-17 LAB — URINALYSIS, ROUTINE W REFLEX MICROSCOPIC
Bilirubin Urine: NEGATIVE
Glucose, UA: NEGATIVE mg/dL
Hgb urine dipstick: NEGATIVE
Ketones, ur: NEGATIVE mg/dL
Leukocytes,Ua: NEGATIVE
Nitrite: NEGATIVE
Protein, ur: NEGATIVE mg/dL
Specific Gravity, Urine: 1.009 (ref 1.005–1.030)
pH: 9 — ABNORMAL HIGH (ref 5.0–8.0)

## 2020-02-17 LAB — CBC WITH DIFFERENTIAL/PLATELET
Abs Immature Granulocytes: 0.03 10*3/uL (ref 0.00–0.07)
Basophils Absolute: 0.1 10*3/uL (ref 0.0–0.1)
Basophils Relative: 1 %
Eosinophils Absolute: 0.2 10*3/uL (ref 0.0–0.5)
Eosinophils Relative: 3 %
HCT: 42.3 % (ref 39.0–52.0)
Hemoglobin: 13.4 g/dL (ref 13.0–17.0)
Immature Granulocytes: 1 %
Lymphocytes Relative: 22 %
Lymphs Abs: 1.4 10*3/uL (ref 0.7–4.0)
MCH: 28.5 pg (ref 26.0–34.0)
MCHC: 31.7 g/dL (ref 30.0–36.0)
MCV: 90 fL (ref 80.0–100.0)
Monocytes Absolute: 0.6 10*3/uL (ref 0.1–1.0)
Monocytes Relative: 9 %
Neutro Abs: 3.9 10*3/uL (ref 1.7–7.7)
Neutrophils Relative %: 64 %
Platelets: 197 10*3/uL (ref 150–400)
RBC: 4.7 MIL/uL (ref 4.22–5.81)
RDW: 13.4 % (ref 11.5–15.5)
WBC: 6.1 10*3/uL (ref 4.0–10.5)
nRBC: 0 % (ref 0.0–0.2)

## 2020-02-17 LAB — VALPROIC ACID LEVEL: Valproic Acid Lvl: 20 ug/mL — ABNORMAL LOW (ref 50.0–100.0)

## 2020-02-17 MED ORDER — SODIUM CHLORIDE 0.9 % IV BOLUS
500.0000 mL | Freq: Once | INTRAVENOUS | Status: AC
  Administered 2020-02-17: 500 mL via INTRAVENOUS

## 2020-02-17 NOTE — ED Notes (Signed)
Pt has gone to CT SCAN WILL GET VITALS ON RETURN

## 2020-02-17 NOTE — ED Triage Notes (Signed)
Patient here for wittiness mechanical fall this morning. No LOC per family. Patient hit his head and tail bone pain. Patient is on asprin. Per family patient had total four falls this week. This is the first time he is being seen this week for falls. Hx. Dementia.

## 2020-02-17 NOTE — ED Provider Notes (Signed)
[facility] DEPT Provider Note   CSN: [id number] Arrival date & time: 02/17/20  1248     History No chief complaint on file.   [name] [name] is a 85 y.o. male.  Pt presents to the ED today with fall.  Pt has a hx of ambulatory dysfunction and has multiple falls.  He is followed by Dr. Krista Blue (neurology).  Pt saw her on 4/20.  She ordered a MRI which is scheduled for 5/19 and home PT.  Pt said he was in the bathroom today and was walking to the sink to wash his hands.  He tripped and fell and hit his head.  The pt said he had his walker in the bathroom, but it fell when he tried to grab it.        Past Medical History:  Diagnosis Date  . Acid reflux 09/21/2016  . ALLERGIC RHINITIS 11/15/2008  . Asthma   . Bladder outlet obstruction 03/22/2013  . BPH (benign prostatic hypertrophy)   . Chronic airway obstruction (HCC) 11/14/2008   Methacholine challenge test for 08/04/13 while on GERD RX > neg  Overview:  Overview:  Methacholine challenge test for 08/04/13 while on GERD RX > neg  Last Assessment & Plan:  Please continue your pantoprazole 40mg  in the evening Continue your your loratadine daily You could consider increasing your fluticasone nasal spray to twice a day during the allergy months.  Use your albuterol 2 puffs as n  . Diabetes mellitus, type 2 ([place]) 09/21/2016  . Echocardiogram    Echo 12/18: EF 55-60, normal wall motion, grade 1 diastolic dysfunction, mild MR, mildly dilated RV  . ED (erectile dysfunction) of organic origin 09/21/2012  . Essential hypertension 11/15/2008  . Felon of finger of right hand 04/06/2016  . Gait difficulty   . History of hypoxemic respiratory failure 09/26/2017  . History of MI (myocardial infarction) 11/15/2008   Reported hx per patient - Patient did not have heart cath/PCI  . History of orthostatic hypotension   . Holter Monitor    Holter 8/18:  NSR, Frequent PVCs, Occasional PACs  . Hyperlipidemia   . Increased  frequency of urination 05/27/2017  . Leg weakness 04/13/2011  . Musculoskeletal symptoms referable to limbs 04/13/2011  . Nuclear stress test    Nuclear stress test 12/19: EF 57, no ischemia, +bowel atten artifact, Low Risk  . OSA (obstructive sleep apnea) 05/10/2015  . Paronychia of left middle finger 04/06/2016  . Phimosis 10/08/2016   Overview:  Added automatically from request for surgery [id number]  . Prostate cancer ([place]) 01/02/2009  . Rheumatic fever   . Right bundle branch block 03/17/2013  . Sleep apnea   . Syncope 06/01/2017    Patient Active Problem List   Diagnosis Date Noted  . Dementia with behavioral disturbance ([place]) 02/06/2020  . Peripheral arterial disease ([place]) 10/24/2019  . Pain due to onychomycosis of toenails of both feet 04/12/2019  . Frequent falls 03/09/2019  . Partial symptomatic epilepsy with complex partial seizures, not intractable, without status epilepticus ([place]) 09/20/2018  . Coronary artery calcification seen on CT scan 09/06/2018  . Aphasia 08/16/2018  . Gait abnormality 08/16/2018  . Subdural hematoma ([place]) 08/16/2018  . Erroneous encounter - disregard 10/05/2017  . Acute bronchitis 09/26/2017  . Acute hypoxemic respiratory failure ([place]) 09/26/2017  . Syncope 06/01/2017  . Increased frequency of urination 05/27/2017  . Phimosis 10/08/2016  . Asthma without status asthmaticus 09/21/2016  . Asthma attack 09/21/2016  . Diabetes  mellitus, type 2 ([place]) 09/21/2016  . Acid reflux 09/21/2016  . Personal history of prostate cancer 06/18/2016  . Felon of finger of right hand 04/06/2016  . Paronychia of left middle finger 04/06/2016  . OSA (obstructive sleep apnea) 05/10/2015  . Bladder outlet obstruction 03/22/2013  . Right bundle branch block 03/17/2013  . Incomplete emptying of bladder 09/22/2012  . ED (erectile dysfunction) of organic origin 09/21/2012  . Leg weakness 04/13/2011  . Musculoskeletal symptoms referable to limbs 04/13/2011  . CA of prostate  ([place]) 01/02/2009  . Cough 11/20/2008  . Hyperlipidemia 11/15/2008  . Benign essential HTN 11/15/2008  . ALLERGIC RHINITIS 11/15/2008  . Chronic airway obstruction ([place]) 11/14/2008    Past Surgical History:  Procedure Laterality Date  . APPENDECTOMY  1954  . CATARACT EXTRACTION Left 03/25/2017  . HERNIA REPAIR    . INCISION AND DRAINAGE ABSCESS Right 04/02/2016   Procedure: INCISION AND DRAINAGE ABSCESS;  Surgeon: Leanora Cover, MD;  Location: [facility];  Service: Orthopedics;  Laterality: Right;  Marland Kitchen VASECTOMY         Family History  Problem Relation Age of Onset  . Heart failure Mother   . Stroke Father        age 44  . Arthritis Other   . Diabetes Other   . Heart disease Other   . Hyperlipidemia Other   . Kidney disease Other   . Obesity Other   . Stroke Brother     Social History   Tobacco Use  . Smoking status: Former Smoker    Packs/day: 1.00    Years: 30.00    Pack years: 30.00    Types: Cigarettes, Pipe    Quit date: 10/19/2012    Years since quitting: 7.3  . Smokeless tobacco: Never Used  . Tobacco comment: 1 pipe every 3 days, quit smoking cig 12 yrs ago 07/06/13  Substance Use Topics  . Alcohol use: No    Comment: no etoh in 12 years  . Drug use: No    Home Medications Prior to Admission medications   Medication Sig Start Date End Date Taking? Authorizing Provider  acetaminophen (TYLENOL) 500 MG tablet Take 500 mg by mouth 2 (two) times daily as needed for moderate pain.     [provider]  albuterol (PROVENTIL) (2.5 MG/3ML) 0.083% nebulizer solution Take 2.5 mg by nebulization daily as needed.  12/04/18   [provider]  ALPRAZolam Duanne Moron) 0.5 MG tablet Take 0.5 tablets by mouth 2 (two) times daily. 03/07/18   [provider]  aspirin EC 81 MG tablet Take 1 tablet by mouth daily.    [provider]  bisoprolol (ZEBETA) 5 MG tablet Take 0.5 tablets (2.5 mg total) by mouth daily. 01/11/20   Nahser, Wonda Cheng, MD  divalproex (DEPAKOTE SPRINKLES) 125 MG capsule Take 3 capsules (375 mg total) by mouth 2 (two) times daily. 02/06/20   Marcial Pacas, MD  escitalopram (LEXAPRO) 20 MG tablet Take 20 mg by mouth daily. 10/04/19   [provider]  finasteride (PROSCAR) 5 MG tablet Take 5 mg by mouth daily.      [provider]  glucose blood (TRUETEST TEST) test strip  08/04/13   [provider]  latanoprost (XALATAN) 0.005 % ophthalmic solution Place 1 drop into both eyes daily. 08/07/16   [provider]  levETIRAcetam (KEPPRA) 750 MG tablet TAKE 1 TABLET BY MOUTH TWICE A DAY 08/13/19   Marcial Pacas, MD  losartan (COZAAR) 25  MG tablet Take 1 tablet (25 mg total) by mouth daily. 09/05/19   Sande Rives E, PA-C  magnesium oxide (MAG-OX) 400 MG tablet Take 400 mg by mouth daily.    [provider]  memantine (NAMENDA) 10 MG tablet Take 1 tablet (10 mg total) by mouth 2 (two) times daily. 09/25/19   Suzzanne Cloud, NP  metFORMIN (GLUCOPHAGE) 500 MG tablet Take 500 mg by mouth at bedtime.     [provider]  montelukast (SINGULAIR) 10 MG tablet Take 10 mg by mouth at bedtime.    [provider]  Multiple Vitamins-Minerals (PRESERVISION AREDS 2+MULTI VIT PO) Take 1 tablet by mouth 2 (two) times daily.    [provider]  nitroGLYCERIN (NITROSTAT) 0.4 MG SL tablet PLACE 1 TABLET UNDER THE TONGUE EVERY 5 MINS AS NEEDED FOR UP TO 3 DOSES IF PAIN CONTINUES,CALL 911 07/31/19   Nahser, Wonda Cheng, MD  pantoprazole (PROTONIX) 40 MG tablet TAKE 1 TABLET BY MOUTH DAILY. 01/13/16   Collene Gobble, MD  potassium chloride SA (K-DUR) 20 MEQ tablet Take 20 mEq by mouth daily.    [provider]  rosuvastatin (CRESTOR) 10 MG tablet TAKE 1 TABLET BY MOUTH EVERY DAY 05/25/19   Nahser, Wonda Cheng, MD    Allergies    Doxycycline monohydrate, Penicillins, Fluticasone-salmeterol, Levetiracetam, Lisinopril, Prednisone, Shark cartilage, Tetracyclines & related,  and Tetracycline  Review of Systems   Review of Systems  Neurological: Positive for headaches.  All other systems reviewed and are negative.   Physical Exam Updated Vital Signs BP (!) 149/115 (BP Location: Right Arm)   Pulse 63   Temp 97.7 F (36.5 C) (Oral)   Resp 18   SpO2 100%   Physical Exam Vitals and nursing note reviewed.  Constitutional:      Appearance: Normal appearance.  HENT:     Head: Normocephalic and atraumatic.      Right Ear: External ear normal.     Left Ear: External ear normal.     Nose: Nose normal.     Mouth/Throat:     Mouth: Mucous membranes are moist.     Pharynx: Oropharynx is clear.  Eyes:     Extraocular Movements: Extraocular movements intact.     Conjunctiva/sclera: Conjunctivae normal.     Pupils: Pupils are equal, round, and reactive to light.  Cardiovascular:     Rate and Rhythm: Normal rate and regular rhythm.     Pulses: Normal pulses.     Heart sounds: Normal heart sounds.  Pulmonary:     Effort: Pulmonary effort is normal.     Breath sounds: Normal breath sounds.  Abdominal:     General: Abdomen is flat. Bowel sounds are normal.     Palpations: Abdomen is soft.  Musculoskeletal:        General: Normal range of motion.     Cervical back: Normal range of motion and neck supple.  Skin:    General: Skin is warm.     Capillary Refill: Capillary refill takes less than 2 seconds.  Neurological:     General: No focal deficit present.     Mental Status: He is alert and oriented to person, place, and time.  Psychiatric:        Mood and Affect: Mood normal.        Behavior: Behavior normal.        Thought Content: Thought content normal.        Judgment: Judgment normal.  ED Results / Procedures / Treatments   Labs (all labs ordered are listed, but only abnormal results are displayed) Labs Reviewed  COMPREHENSIVE METABOLIC PANEL - Abnormal; Notable for the following components:      Result Value   Glucose, Bld 110 (*)     Calcium 8.7 (*)    All other components within normal limits  URINALYSIS, ROUTINE W REFLEX MICROSCOPIC - Abnormal; Notable for the following components:   pH 9.0 (*)    All other components within normal limits  CBC WITH DIFFERENTIAL/PLATELET  VALPROIC ACID LEVEL  LEVETIRACETAM LEVEL    EKG None  Radiology DG Lumbar Spine Complete  Result Date: 02/17/2020 CLINICAL DATA:  Fall, low back pain EXAM: LUMBAR SPINE - COMPLETE 4+ VIEW COMPARISON:  CT abdomen pelvis, 11/01/2019 FINDINGS: There is no evidence of lumbar spine fracture. Alignment is normal. Mild multilevel disc space height loss and osteophytosis. Aortic atherosclerosis. IMPRESSION: No fracture or dislocation of the lumbar spine. Electronically Signed   By: Eddie Candle M.D.   On: 02/17/2020 14:53   CT Head Wo Contrast  Result Date: 02/17/2020 CLINICAL DATA:  85 year old male with a history of headache EXAM: CT HEAD WITHOUT CONTRAST TECHNIQUE: Contiguous axial images were obtained from the base of the skull through the vertex without intravenous contrast. COMPARISON:  None. FINDINGS: Brain: No acute intracranial hemorrhage. No midline shift or mass effect. Gray-white differentiation maintained. Senescent volume loss, similar to the comparison. Unremarkable appearance of the ventricular system. Vascular: Calcifications of the intracranial vasculature. Skull: No acute fracture.  No aggressive bone lesion identified. Sinuses/Orbits: Unremarkable appearance of the orbits. Mastoid air cells clear. No middle ear effusion. No significant sinus disease. Other: None IMPRESSION: No acute intracranial abnormality. Chronic white matter disease and associated intracranial atherosclerosis. Electronically Signed   By: Corrie Mckusick D.O.   On: 02/17/2020 14:39    Procedures Procedures (including critical care time)  Medications Ordered in ED Medications  sodium chloride 0.9 % bolus 500 mL (500 mLs Intravenous New Bag/Given 02/17/20 1402)    ED  Course  I have reviewed the triage vital signs and the nursing notes.  Pertinent labs & imaging results that were available during my care of the patient were reviewed by me and considered in my medical decision making (see chart for details).    MDM Rules/Calculators/A&P                      Pt was able to stand with assistance.  Pt's wife said his right leg has been weak for a long time and this is chronic.  Pt wants to go home.  MRI outpatient has been scheduled by his neurologist.  I don't think I need to do one sooner.  Pt's wife just wanted to make sure he did not injure himself in his fall today.  Keppra and Depakote level ordered as well.  Pending at d/c.   Final Clinical Impression(s) / ED Diagnoses Final diagnoses:  Ambulatory dysfunction    Rx / DC Orders ED Discharge Orders    None       Isla Pence, MD 02/17/20 1531

## 2020-02-20 ENCOUNTER — Other Ambulatory Visit: Payer: Self-pay

## 2020-02-20 ENCOUNTER — Ambulatory Visit: Payer: Medicare Other | Admitting: Podiatry

## 2020-02-20 ENCOUNTER — Encounter: Payer: Self-pay | Admitting: Podiatry

## 2020-02-20 VITALS — Temp 97.6°F

## 2020-02-20 DIAGNOSIS — B351 Tinea unguium: Secondary | ICD-10-CM | POA: Diagnosis not present

## 2020-02-20 DIAGNOSIS — M79675 Pain in left toe(s): Secondary | ICD-10-CM

## 2020-02-20 DIAGNOSIS — E119 Type 2 diabetes mellitus without complications: Secondary | ICD-10-CM | POA: Diagnosis not present

## 2020-02-20 DIAGNOSIS — M79674 Pain in right toe(s): Secondary | ICD-10-CM

## 2020-02-20 NOTE — Progress Notes (Signed)
This patient returns to the office for evaluation and treatment of long thick painful nails .  This patient is unable to trim his own nails since the patient cannot reach the feet.  Patient says the nails are painful walking and wearing his shoes.  He returns for preventive foot care services.  General Appearance  Alert, conversant and in no acute stress.  Vascular  Dorsalis pedis and posterior tibial  pulses are palpable  bilaterally.  Capillary return is within normal limits  bilaterally. Temperature is within normal limits  bilaterally.  Neurologic  Senn-Weinstein monofilament wire test within normal limits  bilaterally. Muscle power within normal limits bilaterally.  Nails Thick disfigured discolored nails with subungual debris  from hallux to fifth toes bilaterally. No evidence of bacterial infection or drainage bilaterally.  Orthopedic  No limitations of motion  feet .  No crepitus or effusions noted.  No bony pathology or digital deformities noted.  Skin  normotropic skin with no porokeratosis noted bilaterally.  No signs of infections or ulcers noted.     Onychomycosis  Pain in toes right foot  Pain in toes left foot  Debridement  of nails  1-5  B/L with a nail nipper.  Nails were then filed using a dremel tool with no incidents.    RTC 3 months    Tauheed Mcfayden DPM  

## 2020-02-21 ENCOUNTER — Telehealth: Payer: Self-pay | Admitting: Neurology

## 2020-02-21 MED ORDER — DIVALPROEX SODIUM 500 MG PO DR TAB: 500.0000 mg | DELAYED_RELEASE_TABLET | Freq: Two times a day (BID) | ORAL | 11 refills | Status: DC

## 2020-02-21 NOTE — Telephone Encounter (Signed)
I was able to speak to the patient's daughter, [name] [name]. I inquired about his medications. She does not feel his fall was caused by dizziness related to his medication. States that they were confused at the last appointment on 02/06/20 and stopped the Keppra completely. He has only been taking Depakote Sprinkles 125mg , 3 capsules BID. Reports her mother has been unable to break open the capsules and has been allowing the patient to just swallow them. He does okay on most days. Says he has only had one other blanking out spell but he was also not feeling well at all that day. In fact, he and his wife have both been ill. Pam told me she has also been sick and is going for a COVID test today.   This information has been discussed with Dr. Krista Blue. Per vo, she ordered the following: 1) stay off Keppra 2) stop Depakote Sprinkles 3) start Depakote DR 500mg , one tab BID - they may crush the tab if the pt has difficulty swallowing whole 4) call our office to report any seizure-like activity  His daughter verbalized understanding of the new plan and will review with her mother. Provided our number to call back with any questions.

## 2020-02-21 NOTE — Addendum Note (Signed)
Addended by: Noberto Retort C on: 02/21/2020 11:40 AM   Modules accepted: Orders

## 2020-02-21 NOTE — Telephone Encounter (Signed)
Please call patient, I was able to review his emergency room presentation on Feb 17, 2020, he fell at bathroom,   I reviewed CT head without contrast, no acute abnormality,  Laboratory evaluation showed low Depakote level 20, normal CBC, no significant abnormality on CMP,  Please check with his family, is he tolerating current combination of Depakote sprinkle  125 mg 3 tablets twice a day plus Keppra 750 mg twice a day,  Make sure he does not have recurrent seizure-like spells, if you experience any increased dizziness, unsteady gait, may consider lower Keppra dose to 500 mg twice daily

## 2020-02-22 IMAGING — CT CT HEAD W/O CM
4 series · 17 of 47 positions shown, 19 images · non-contrast
Comparison: 10/28/2017 and prior CTs

CLINICAL DATA: 83-year-old male with slurred speech and ataxia
today.

EXAM:
CT HEAD WITHOUT CONTRAST
TECHNIQUE: Contiguous axial images were obtained from the base of the skull
through the vertex without intravenous contrast.

[Series 3: head without · axial · non-contrast · 0.42mm/px · z∈[-91,+34]mm · 7 of 35 slices shown, 9 images]
[im 5/35  brain]
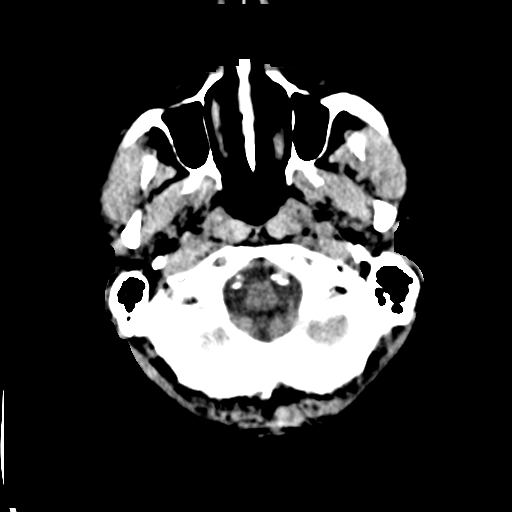
[im 5/35  bone]
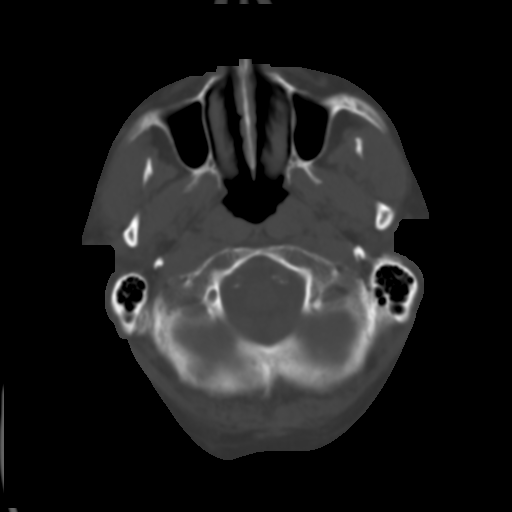
[im 9/35  brain]
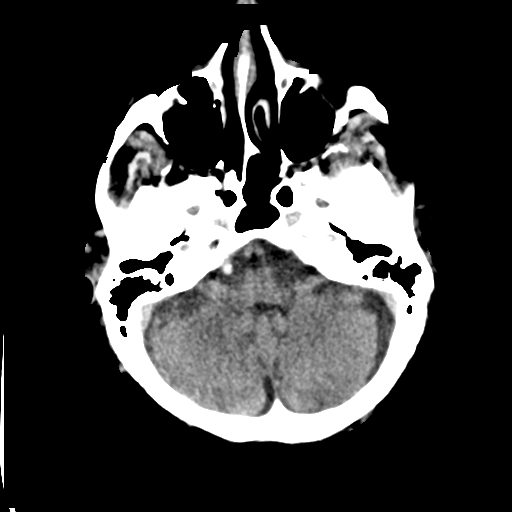
[im 13/35  brain]
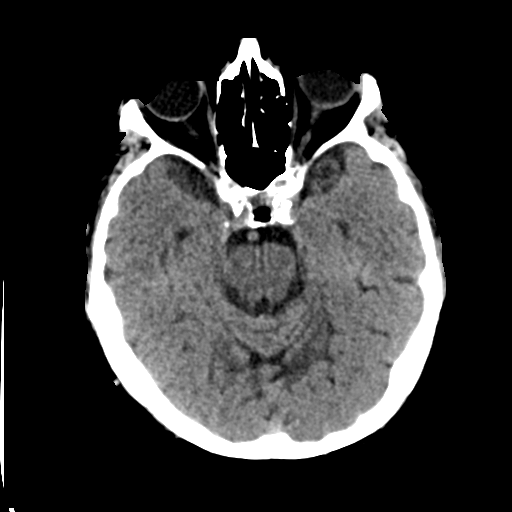
[im 18/35  brain]
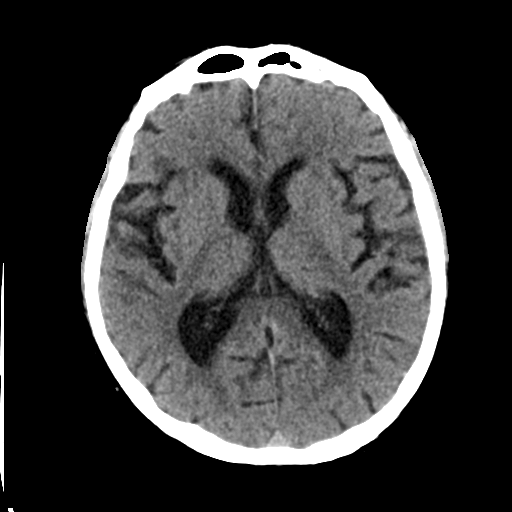
[im 22/35  brain]
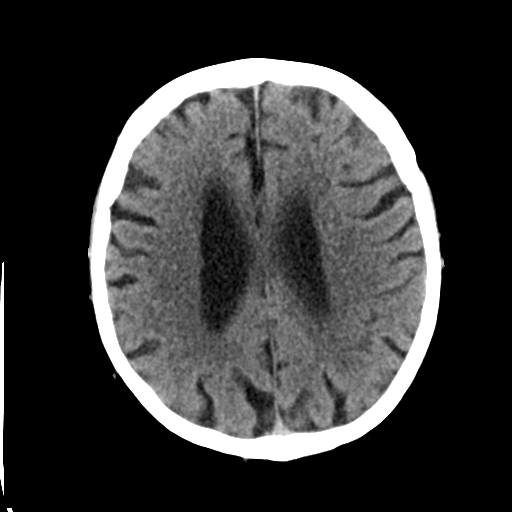
[im 22/35  bone]
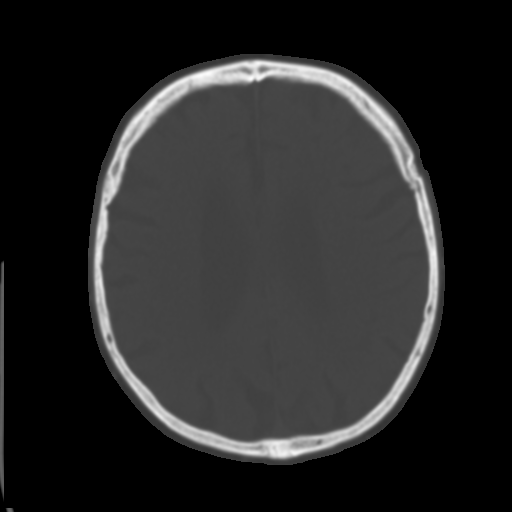
[im 26/35  brain]
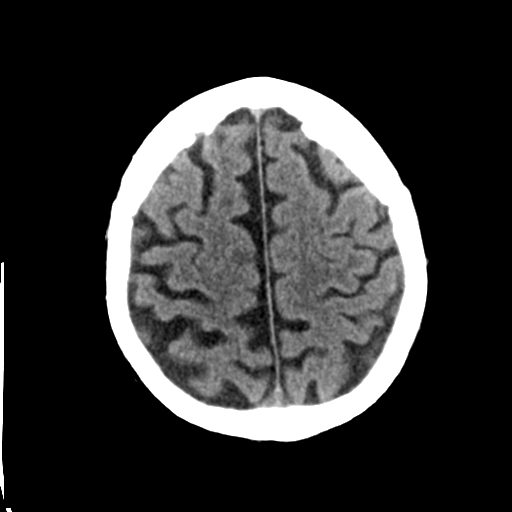
[im 30/35  brain]
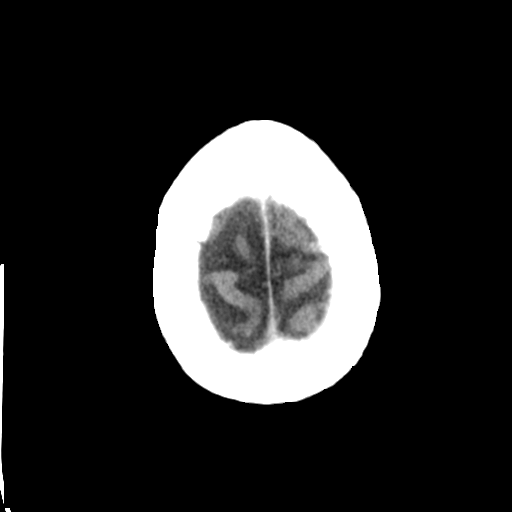

[Series 4: head bone · axial · 0.42mm/px · z∈[-95,-35]mm · 4 of 87 slices shown]
[im 9/87  bone]
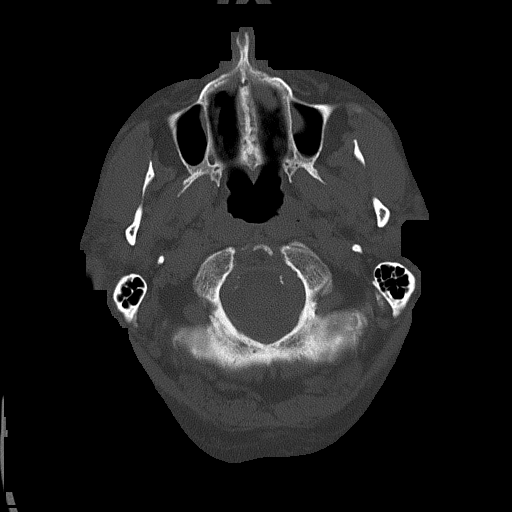
[im 18/87  bone]
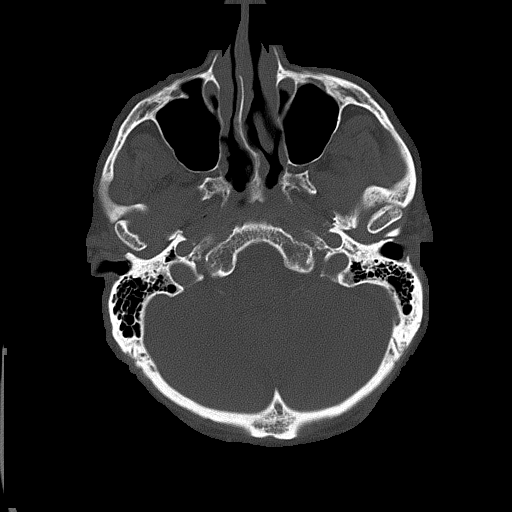
[im 26/87  bone]
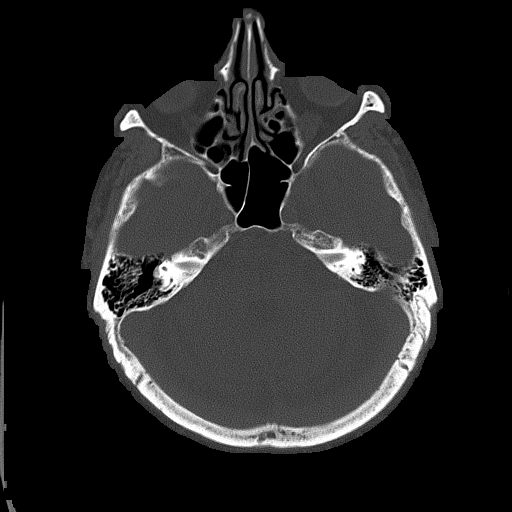
[im 39/87  bone]
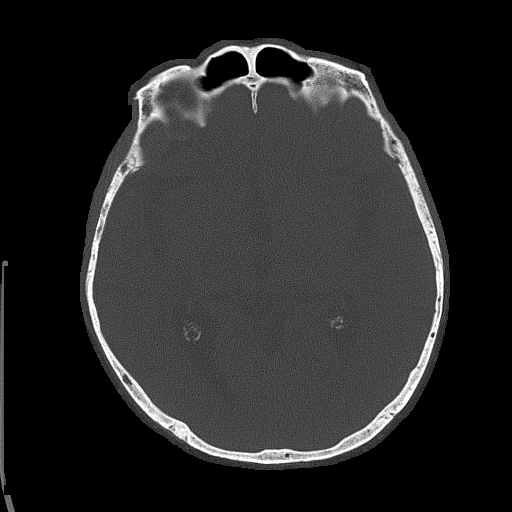

[Series 5: head without cor · coronal · non-contrast · 0.34mm/px · 3 of 67 slices shown]
[im 23/67  brain]
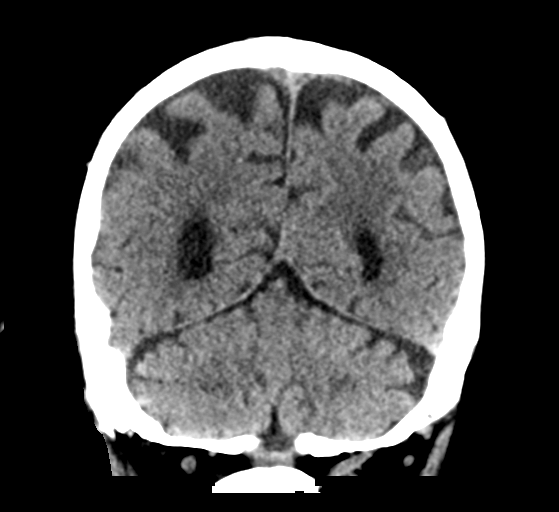
[im 30/67  brain]
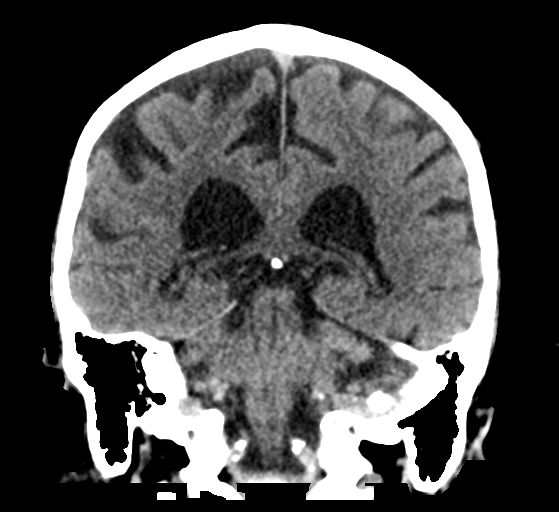
[im 37/67  brain]
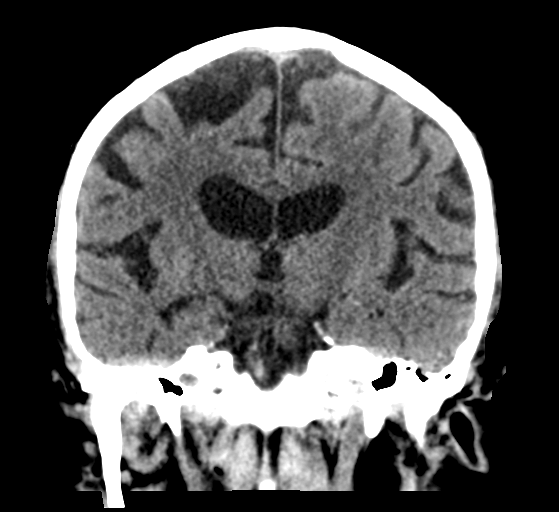

[Series 6: head without sag · sagittal · non-contrast · 0.34mm/px · 3 of 61 slices shown]
[im 21/61  brain]
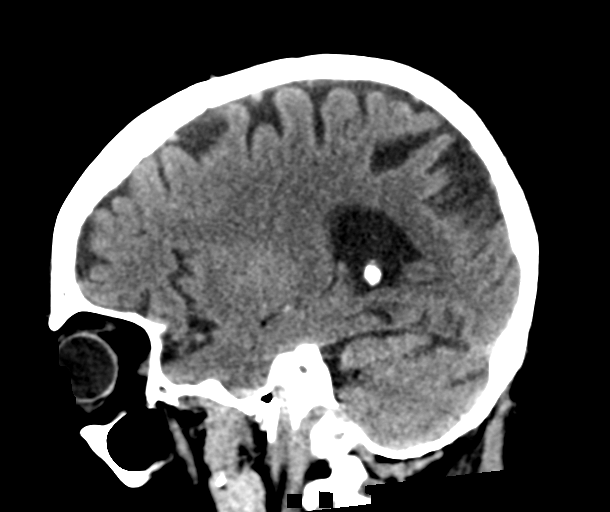
[im 31/61  brain]
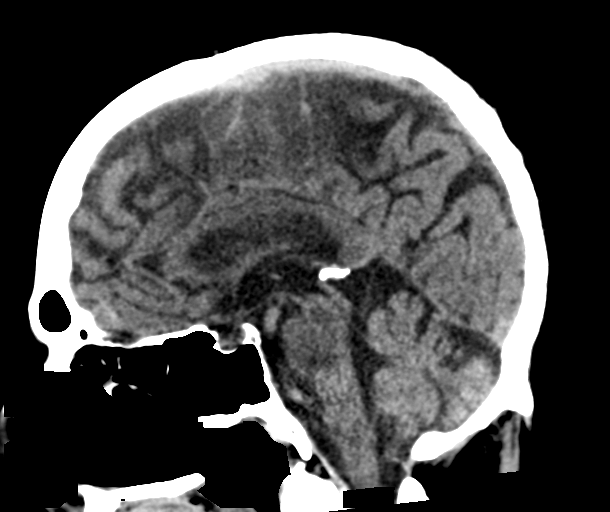
[im 41/61  brain]
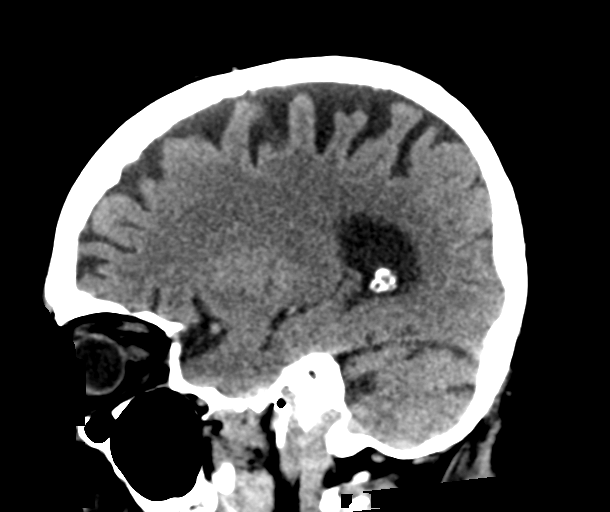

[17 of 47 positions shown; findings below may reference images not displayed]

FINDINGS: Brain: No evidence of acute infarction, hemorrhage, hydrocephalus,
extra-axial collection or mass lesion/mass effect.

Atrophy and mild chronic small-vessel white matter ischemic changes
again noted.

Vascular: Atherosclerotic calcifications again noted

Skull: Normal. Negative for fracture or focal lesion.

Sinuses/Orbits: No acute finding.

Other: None.
IMPRESSION: 1. No evidence of acute intracranial abnormality
2. Atrophy and mild chronic small-vessel white matter ischemic
changes

## 2020-03-06 ENCOUNTER — Ambulatory Visit
Admission: RE | Admit: 2020-03-06 | Discharge: 2020-03-06 | Disposition: A | Discharge status: Home / Self Care | Payer: Medicare Other | Source: Ambulatory Visit | Attending: Neurology | Admitting: Neurology

## 2020-03-06 ENCOUNTER — Other Ambulatory Visit: Payer: Self-pay

## 2020-03-06 DIAGNOSIS — R269 Unspecified abnormalities of gait and mobility: Secondary | ICD-10-CM

## 2020-03-06 DIAGNOSIS — F0391 Unspecified dementia with behavioral disturbance: Secondary | ICD-10-CM

## 2020-03-11 ENCOUNTER — Telehealth: Payer: Self-pay | Admitting: Neurology

## 2020-03-11 NOTE — Telephone Encounter (Signed)
I was unable to get in touch with his daughter (no answer and voicemail full). I did speak to the patient's wife who verbalized understanding of the results. Provided her with our number to call back if they have any questions.

## 2020-03-11 NOTE — Telephone Encounter (Signed)
Please call patient, MRI of the brain showed no acute abnormality, evidence of generalized atrophy, enlarged ventricle, consistent with his complaints of memory loss, gait abnormalities. IMPRESSION:   MRI brain (without) demonstrating: -Moderate perisylvian and mesial temporal atrophy.  Moderate ventriculomegaly on ex vacuo basis. -No acute findings.

## 2020-03-27 ENCOUNTER — Other Ambulatory Visit: Payer: Self-pay | Admitting: Internal Medicine

## 2020-03-27 DIAGNOSIS — I998 Other disorder of circulatory system: Secondary | ICD-10-CM

## 2020-04-01 ENCOUNTER — Other Ambulatory Visit: Payer: Medicare Other

## 2020-04-04 ENCOUNTER — Ambulatory Visit
Admission: RE | Admit: 2020-04-04 | Discharge: 2020-04-04 | Disposition: A | Discharge status: Home / Self Care | Payer: Medicare Other | Source: Ambulatory Visit | Attending: Internal Medicine | Admitting: Internal Medicine

## 2020-04-04 ENCOUNTER — Other Ambulatory Visit: Payer: Self-pay | Admitting: Internal Medicine

## 2020-04-04 DIAGNOSIS — I998 Other disorder of circulatory system: Secondary | ICD-10-CM
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
## 2020-04-16 ENCOUNTER — Other Ambulatory Visit: Payer: Self-pay

## 2020-04-16 ENCOUNTER — Encounter: Payer: Self-pay | Admitting: Physician Assistant

## 2020-04-16 ENCOUNTER — Ambulatory Visit: Payer: Medicare Other | Admitting: Physician Assistant

## 2020-04-16 VITALS — BP 140/80 | HR 76 | Ht 70.0 in | Wt 172.0 lb

## 2020-04-16 DIAGNOSIS — I739 Peripheral vascular disease, unspecified: Secondary | ICD-10-CM | POA: Diagnosis not present

## 2020-04-16 DIAGNOSIS — R634 Abnormal weight loss: Secondary | ICD-10-CM | POA: Diagnosis not present

## 2020-04-16 DIAGNOSIS — I10 Essential (primary) hypertension: Secondary | ICD-10-CM

## 2020-04-16 NOTE — Patient Instructions (Signed)
Medication Instructions:  Your physician recommends that you continue on your current medications as directed. Please refer to the Current Medication list given to you today.  *If you need a refill on your cardiac medications before your next appointment, please call your pharmacy*   Lab Work: None If you have labs (blood work) drawn today and your tests are completely normal, you will receive your results only by: Marland Kitchen MyChart Message (if you have MyChart) OR . A paper copy in the mail If you have any lab test that is abnormal or we need to change your treatment, we will call you to review the results.   Testing/Procedures: None   Follow-Up: At [facility], you and your health needs are our priority.  As part of our continuing mission to provide you with exceptional heart care, we have created designated Provider Care Teams.  These Care Teams include your primary Cardiologist (physician) and Advanced Practice Providers (APPs -  Physician Assistants and Nurse Practitioners) who all work together to provide you with the care you need, when you need it.  We recommend signing up for the patient portal called "MyChart".  Sign up information is provided on this After Visit Summary.  MyChart is used to connect with patients for Virtual Visits (Telemedicine).  Patients are able to view lab/test results, encounter notes, upcoming appointments, etc.  Non-urgent messages can be sent to your provider as well.   To learn more about what you can do with MyChart, go to [email or web address].    Your next appointment:   6 month(s)  The format for your next appointment:   In Person  Provider:   You may see Mertie Moores, MD or one of the following Advanced Practice Providers on your designated Care Team:    Richardson Dopp, PA-C  Vin McGill, [place]    Other Instructions Check blood pressure once daily for 2 weeks and call us with the readings or send through [facility].

## 2020-05-20 ENCOUNTER — Other Ambulatory Visit: Payer: Self-pay | Admitting: Cardiovascular Disease

## 2020-05-20 ENCOUNTER — Ambulatory Visit (INDEPENDENT_AMBULATORY_CARE_PROVIDER_SITE_OTHER): Payer: Medicare Other | Admitting: Surgery

## 2020-05-20 ENCOUNTER — Other Ambulatory Visit: Payer: Self-pay

## 2020-05-20 ENCOUNTER — Encounter: Payer: Self-pay | Admitting: Surgery

## 2020-05-20 VITALS — BP 129/67 | HR 56 | Temp 97.5°F | Resp 20 | Ht 70.0 in | Wt 172.0 lb

## 2020-05-20 DIAGNOSIS — I70213 Atherosclerosis of native arteries of extremities with intermittent claudication, bilateral legs: Secondary | ICD-10-CM

## 2020-05-20 NOTE — Progress Notes (Signed)
[facility] [place]  Patient name: [name] [name] MRN: [id number] DOB: 15-Feb-1935 Sex: male   REASON FOR VISIT:    Follow up  HISOTRY OF PRESENT ILLNESS:    [name] [name] is a 85 y.o. male that I have seen for abdominal aneurysm.  He presented to the emergency department in January 2021 with abdominal pain and near syncope.  He had a CT scan that showed a 4.0 cm aneurysm without a infrarenal neck.  Marland Kitchen  He responded to IV fluids.  I last saw him in February,  I recommended repair at 5.5 cm.  He was scheduled for u/s in 1 year.  He is sent back over for abnormal lower extremity Doppler studies.  He denies any rest pain.  He does not have any open wounds.  He does not ambulate much.  He denies claudication.  Patient is medically managed for hypertension.  He takes a statin for hypercholesterolemia.  He does have diabetes as well as dementia.  He has had a heart attack in the past. PAST MEDICAL HISTORY:   Past Medical History:  Diagnosis Date  . Acid reflux 09/21/2016  . ALLERGIC RHINITIS 11/15/2008  . Asthma   . Bladder outlet obstruction 03/22/2013  . BPH (benign prostatic hypertrophy)   . Chronic airway obstruction (HCC) 11/14/2008   Methacholine challenge test for 08/04/13 while on GERD RX > neg  Overview:  Overview:  Methacholine challenge test for 08/04/13 while on GERD RX > neg  Last Assessment & Plan:  Please continue your pantoprazole 40mg  in the evening Continue your your loratadine daily You could consider increasing your fluticasone nasal spray to twice a day during the allergy months.  Use your albuterol 2 puffs as n  . Diabetes mellitus, type 2 ([place]) 09/21/2016  . Echocardiogram    Echo 12/18: EF 55-60, normal wall motion, grade 1 diastolic dysfunction, mild MR, mildly dilated RV  . ED (erectile dysfunction) of organic origin 09/21/2012  . Essential hypertension 11/15/2008  . Felon of finger of right hand 04/06/2016  .  Gait difficulty   . History of hypoxemic respiratory failure 09/26/2017  . History of MI (myocardial infarction) 11/15/2008   Reported hx per patient - Patient did not have heart cath/PCI  . History of orthostatic hypotension   . Holter Monitor    Holter 8/18:  NSR, Frequent PVCs, Occasional PACs  . Hyperlipidemia   . Increased frequency of urination 05/27/2017  . Leg weakness 04/13/2011  . Musculoskeletal symptoms referable to limbs 04/13/2011  . Nuclear stress test    Nuclear stress test 12/19: EF 57, no ischemia, +bowel atten artifact, Low Risk  . OSA (obstructive sleep apnea) 05/10/2015  . Paronychia of left middle finger 04/06/2016  . Phimosis 10/08/2016   Overview:  Added automatically from request for surgery [id number]  . Prostate cancer ([place]) 01/02/2009  . Rheumatic fever   . Right bundle branch block 03/17/2013  . Sleep apnea   . Syncope 06/01/2017     FAMILY HISTORY:   Family History  Problem Relation Age of Onset  . Heart failure Mother   . Stroke Father        age 48  . Arthritis Other   . Diabetes Other   . Heart disease Other   . Hyperlipidemia Other   . Kidney disease Other   . Obesity Other   . Stroke Brother     SOCIAL HISTORY:   Social History   Tobacco Use  .  Smoking status: Former Smoker    Packs/day: 1.00    Years: 30.00    Pack years: 30.00    Types: Cigarettes, Pipe    Quit date: 10/19/2012    Years since quitting: 7.5  . Smokeless tobacco: Never Used  . Tobacco comment: 1 pipe every 3 days, quit smoking cig 12 yrs ago 07/06/13  Substance Use Topics  . Alcohol use: No    Comment: no etoh in 12 years     ALLERGIES:   Allergies  Allergen Reactions  . Doxycycline Monohydrate Hives  . Penicillins Hives    Has patient had a PCN reaction causing immediate rash, facial/tongue/throat swelling, SOB or lightheadedness with hypotension: No Has patient had a PCN reaction causing severe rash involving mucus membranes or skin necrosis: Yes Has patient  had a PCN reaction that required hospitalization: No Has patient had a PCN reaction occurring within the last 10 years: No If all of the above answers are "NO", then may proceed with Cephalosporin use.   Marland Kitchen Fluticasone-Salmeterol     REACTION: sensation of throat closing REACTION: sensation of throat closing  . Levetiracetam Other (See Comments)    aggitation  . Lisinopril Cough  . Prednisone     Per WU:[id number] agitation Per NW:GNFAOZH agitation  . Shark Cartilage   . Tetracyclines & Related Hives  . Tetracycline Rash     CURRENT MEDICATIONS:   Current Outpatient Medications  Medication Sig Dispense Refill  . acetaminophen (TYLENOL) 500 MG tablet Take 500 mg by mouth 2 (two) times daily as needed for moderate pain.     Marland Kitchen albuterol (PROVENTIL) (2.5 MG/3ML) 0.083% nebulizer solution Take 2.5 mg by nebulization daily as needed.     . ALPRAZolam (XANAX) 0.5 MG tablet Take 0.5 tablets by mouth 2 (two) times daily.    . bisoprolol (ZEBETA) 5 MG tablet Take 0.5 tablets (2.5 mg total) by mouth daily. 45 tablet 3  . divalproex (DEPAKOTE) 500 MG DR tablet Take 1 tablet (500 mg total) by mouth 2 (two) times daily. 60 tablet 11  . escitalopram (LEXAPRO) 20 MG tablet Take 20 mg by mouth daily.    . finasteride (PROSCAR) 5 MG tablet Take 5 mg by mouth daily.      Marland Kitchen glucose blood (TRUETEST TEST) test strip     . losartan (COZAAR) 25 MG tablet Take 1 tablet (25 mg total) by mouth daily.    . magnesium oxide (MAG-OX) 400 MG tablet Take 400 mg by mouth daily.    . memantine (NAMENDA) 10 MG tablet Take 1 tablet (10 mg total) by mouth 2 (two) times daily. 180 tablet 4  . metFORMIN (GLUCOPHAGE) 500 MG tablet Take 500 mg by mouth at bedtime.     . montelukast (SINGULAIR) 10 MG tablet Take 10 mg by mouth at bedtime.    . Multiple Vitamins-Minerals (PRESERVISION AREDS 2+MULTI VIT PO) Take 1 tablet by mouth 2 (two) times daily.    . nitroGLYCERIN (NITROSTAT) 0.4 MG SL tablet PLACE 1 TABLET UNDER THE  TONGUE EVERY 5 MINS AS NEEDED FOR UP TO 3 DOSES IF PAIN CONTINUES,CALL 911 25 tablet 10  . pantoprazole (PROTONIX) 40 MG tablet TAKE 1 TABLET BY MOUTH DAILY. 90 tablet 0  . potassium chloride SA (K-DUR) 20 MEQ tablet Take 20 mEq by mouth daily.    . rosuvastatin (CRESTOR) 10 MG tablet TAKE 1 TABLET BY MOUTH EVERY DAY 90 tablet 3  . aspirin EC 81 MG tablet Take 1 tablet by mouth daily. (  Patient not taking: Reported on 04/16/2020)    . latanoprost (XALATAN) 0.005 % ophthalmic solution Place 1 drop into both eyes daily. (Patient not taking: Reported on 04/16/2020)     No current facility-administered medications for this visit.    REVIEW OF SYSTEMS:   [X]  denotes positive finding, [ ]  denotes negative finding Cardiac  Comments:  Chest pain or chest pressure:    Shortness of breath upon exertion:    Short of breath when lying flat:    Irregular heart rhythm:        Vascular    Pain in calf, thigh, or hip brought on by ambulation:    Pain in feet at night that wakes you up from your sleep:     Blood clot in your veins:    Leg swelling:         Pulmonary    Oxygen at home:    Productive cough:     Wheezing:         Neurologic    Sudden weakness in arms or legs:     Sudden numbness in arms or legs:     Sudden onset of difficulty speaking or slurred speech:    Temporary loss of vision in one eye:     Problems with dizziness:         Gastrointestinal    Blood in stool:     Vomited blood:         Genitourinary    Burning when urinating:     Blood in urine:        Psychiatric    Major depression:         Hematologic    Bleeding problems:    Problems with blood clotting too easily:        Skin    Rashes or ulcers:        Constitutional    Fever or chills:      PHYSICAL EXAM:   Vitals:   05/20/20 0948  BP: (!) 129/67  Pulse: 56  Resp: 20  Temp: (!) 97.5 F (36.4 C)  SpO2: 96%  Weight: 172 lb (78 kg)  Height: 5\' 10"  (1.778 m)    GENERAL: The patient is a  well-nourished male, in no acute distress. The vital signs are documented above. CARDIAC: There is a regular rate and rhythm.  VASCULAR: Nonpalpable pedal pulses PULMONARY: Non-labored respirations MUSCULOSKELETAL: There are no major deformities or cyanosis. NEUROLOGIC: No focal weakness or paresthesias are detected. SKIN: There are no ulcers or rashes noted. PSYCHIATRIC: The patient has a normal affect.  STUDIES:   I have reviewed the following:  09/2019: ABI/TBIToday's ABIToday's TBIPrevious ABIPrevious TBI  +-------+-----------+-----------+------------+------------+  Right 1.08    0.51    1.0             +-------+-----------+-----------+------------+------------+  Left  0.87    0.49    0.96            +-------+-----------+-----------+------------+------------+   03/2020 Right:  Resting ABI in the mild range arterial occlusive disease which is a slight decreased from the comparison study.  Segmental exam demonstrates developing iliac and tibial arterial disease. The ABI value might decrease after exercise exam.  Left:  Resting ABI in the normal range.  Segmental exam demonstrates developing distal femoropopliteal disease, and the left ABI would likely decrease after exercise exam. MEDICAL ISSUES:   PAD: The patient has had a slight drop in his ABIs.  However, he has no symptoms of claudication and  no open wounds.  Therefore, medical management is recommended, which consists of an aspirin, statin, and blood pressure control, all of which he is taking.  AAA: The patient is scheduled for ultrasound evaluation and 6 months.  He does not have active abdominal pain.    Leia Alf, MD, FACS [facility] [phone] Pager [phone]

## 2020-05-22 ENCOUNTER — Encounter: Payer: Self-pay | Admitting: Podiatry

## 2020-05-22 ENCOUNTER — Ambulatory Visit: Payer: Medicare Other | Admitting: Podiatry

## 2020-05-22 ENCOUNTER — Other Ambulatory Visit: Payer: Self-pay

## 2020-05-22 DIAGNOSIS — E119 Type 2 diabetes mellitus without complications: Secondary | ICD-10-CM

## 2020-05-22 DIAGNOSIS — M79674 Pain in right toe(s): Secondary | ICD-10-CM

## 2020-05-22 DIAGNOSIS — M79675 Pain in left toe(s): Secondary | ICD-10-CM

## 2020-05-22 DIAGNOSIS — B351 Tinea unguium: Secondary | ICD-10-CM

## 2020-05-22 NOTE — Progress Notes (Signed)
This patient returns to the office for evaluation and treatment of long thick painful nails .  This patient is unable to trim his own nails since the patient cannot reach the feet.  Patient says the nails are painful walking and wearing his shoes.  He returns for preventive foot care services.  General Appearance  Alert, conversant and in no acute stress.  Vascular  Dorsalis pedis and posterior tibial  pulses are palpable  bilaterally.  Capillary return is within normal limits  bilaterally. Temperature is within normal limits  bilaterally.  Neurologic  Senn-Weinstein monofilament wire test within normal limits  bilaterally. Muscle power within normal limits bilaterally.  Nails Thick disfigured discolored nails with subungual debris  from hallux to fifth toes bilaterally. No evidence of bacterial infection or drainage bilaterally.  Orthopedic  No limitations of motion  feet .  No crepitus or effusions noted.  No bony pathology or digital deformities noted.  Skin  normotropic skin with no porokeratosis noted bilaterally.  No signs of infections or ulcers noted.     Onychomycosis  Pain in toes right foot  Pain in toes left foot  Debridement  of nails  1-5  B/L with a nail nipper.  Nails were then filed using a dremel tool with no incidents.    RTC 3 months    Daaiyah Baumert DPM  

## 2020-05-28 ENCOUNTER — Telehealth: Payer: Self-pay | Admitting: Neurology

## 2020-05-28 DIAGNOSIS — F0391 Unspecified dementia with behavioral disturbance: Secondary | ICD-10-CM

## 2020-05-28 NOTE — Telephone Encounter (Signed)
Summers,Pam(daughter on DPR) has called to report pt's current medication is not working at all on him.  Dr is asking for a call as to what can be done while waiting for Sept appointment.  Please call

## 2020-05-28 NOTE — Telephone Encounter (Signed)
I called the [name] [name]'s daughter Jeannene Patella ( ok per dpr).  She reports [name] [name] has not been his normal self over the last few weeks.   She sts the [name] [name] is yelling at his wife more, threatening to leave the house, argumentative and restless. She report the [name] [name] is only sleeping for a few hours at night. She reports he has been waking up around 2 am and will be up till 6 or 7 am. [name] [name] does not have access to a vehicle.   [name] [name] is currently taking Depakote 500 mg 1 tab bid and Namenda 10 mg 1 tab bid.  PCP also rx's lexapro and xanax.  Daughter wanted to know if any med changes could be made to help with theses sx. He is scheduled for a f/u in September.

## 2020-05-29 MED ORDER — QUETIAPINE FUMARATE 25 MG PO TABS
25.0000 mg | ORAL_TABLET | Freq: Every day | ORAL | 6 refills | Status: DC
Start: 2020-05-29 — End: 2020-06-20

## 2020-05-29 NOTE — Addendum Note (Signed)
Addended by: Marcial Pacas on: 05/29/2020 08:45 AM   Modules accepted: Orders

## 2020-05-29 NOTE — Telephone Encounter (Signed)
Pt's wife was advised of Dr. Rhea Belton recommendations and verbalized understanding.

## 2020-05-29 NOTE — Telephone Encounter (Signed)
Pt's daughter would like the RN to call the pt's wife [phone] to discuss a confusion she has on the medications that were discussed in the previous conversation.

## 2020-05-29 NOTE — Telephone Encounter (Signed)
I was able to talk with his wife, patient has difficulty sleeping, up frequently during nighttime to use bathroom, this morning, he is very agitated, blaming his wife  He is no longer on Keppra, tolerating Depakote ER 500 mg twice a day, also taking escitalopram 20 mg every morning from his primary care physician,  Add on Seroquel 25 mg every night

## 2020-06-06 ENCOUNTER — Ambulatory Visit: Payer: Medicare Other | Admitting: Neurology

## 2020-06-20 ENCOUNTER — Other Ambulatory Visit: Payer: Self-pay | Admitting: Neurology

## 2020-06-25 ENCOUNTER — Other Ambulatory Visit: Payer: Self-pay

## 2020-06-25 ENCOUNTER — Encounter: Payer: Self-pay | Admitting: Dermatology

## 2020-06-25 ENCOUNTER — Ambulatory Visit: Payer: Medicare Other | Admitting: Dermatology

## 2020-06-25 DIAGNOSIS — Z9181 History of falling: Secondary | ICD-10-CM

## 2020-06-25 DIAGNOSIS — Z8661 Personal history of infections of the central nervous system: Secondary | ICD-10-CM

## 2020-06-25 DIAGNOSIS — L299 Pruritus, unspecified: Secondary | ICD-10-CM

## 2020-06-25 NOTE — Progress Notes (Signed)
cerave itch relif otc with pramoxine

## 2020-06-25 NOTE — Patient Instructions (Addendum)
First visit for [name] date of birth [birth year].  He has had sometimes generalized itching for many months or perhaps years.  This will come and go rather randomly and neither he nor his wife [name] [name] (who was present throughout the visit) ever sees a rash preceding the itch.  No close contacts are itching.  His grandchildren are adults and the first great grandchild will soon appear, but there is no known exposure to itchy people or scabies.  Examination was remarkable for an absence of cutaneous findings.  There is no sign of scabies or hives or dermatitis.  There is no dermatographia.  This may be primary pruritus and not from his skin.  We discussed 3 possible treatment options: #1 topical pramoxine.  They will apply CeraVe  itch relief to all areas prone to itch daily immediately after bathing and this may be applied anywhere on the skin as many times as they would like when there is an active itch.  There are no known side effects.  Should this fail, it will be up to his primary care doctor whether he is a potential candidate to try a neuroactive agent like gabapentin (off label).  Although this may decrease itching similar to decreasing pain with peripheral neuropathy, the doses required concern me because of his history of cognitive dysfunction and falling.  The family will contact me in 2 weeks to give me a follow-up.  Finally #3 we discussed the potential for general body narrowband UVB treatment, but this requires being able to stand in an ultraviolet unit for up to 15 minutes twice a week and this simply would not be practical for [name].  Initial follow-up by phone in 2 weeks.

## 2020-07-07 ENCOUNTER — Emergency Department (HOSPITAL_COMMUNITY): Payer: Medicare Other

## 2020-07-07 ENCOUNTER — Other Ambulatory Visit: Payer: Self-pay

## 2020-07-07 ENCOUNTER — Emergency Department (HOSPITAL_COMMUNITY)
Admission: EM | Admit: 2020-07-07 | Discharge: 2020-07-07 | Disposition: A | Discharge status: Home / Self Care | Payer: Medicare Other | Attending: Emergency Medicine | Admitting: Emergency Medicine

## 2020-07-07 DIAGNOSIS — I10 Essential (primary) hypertension: Secondary | ICD-10-CM | POA: Insufficient documentation

## 2020-07-07 DIAGNOSIS — R079 Chest pain, unspecified: Secondary | ICD-10-CM

## 2020-07-07 DIAGNOSIS — E119 Type 2 diabetes mellitus without complications: Secondary | ICD-10-CM | POA: Insufficient documentation

## 2020-07-07 DIAGNOSIS — Z7984 Long term (current) use of oral hypoglycemic drugs: Secondary | ICD-10-CM | POA: Insufficient documentation

## 2020-07-07 DIAGNOSIS — Z7982 Long term (current) use of aspirin: Secondary | ICD-10-CM | POA: Insufficient documentation

## 2020-07-07 DIAGNOSIS — R0789 Other chest pain: Secondary | ICD-10-CM | POA: Insufficient documentation

## 2020-07-07 DIAGNOSIS — F0390 Unspecified dementia without behavioral disturbance: Secondary | ICD-10-CM | POA: Diagnosis not present

## 2020-07-07 DIAGNOSIS — Z8546 Personal history of malignant neoplasm of prostate: Secondary | ICD-10-CM | POA: Insufficient documentation

## 2020-07-07 DIAGNOSIS — J45909 Unspecified asthma, uncomplicated: Secondary | ICD-10-CM | POA: Diagnosis not present

## 2020-07-07 DIAGNOSIS — Z79899 Other long term (current) drug therapy: Secondary | ICD-10-CM | POA: Insufficient documentation

## 2020-07-07 DIAGNOSIS — Z7951 Long term (current) use of inhaled steroids: Secondary | ICD-10-CM | POA: Diagnosis not present

## 2020-07-07 DIAGNOSIS — Z87891 Personal history of nicotine dependence: Secondary | ICD-10-CM | POA: Insufficient documentation

## 2020-07-07 LAB — CBC WITH DIFFERENTIAL/PLATELET
Abs Immature Granulocytes: 0.04 10*3/uL (ref 0.00–0.07)
Basophils Absolute: 0.1 10*3/uL (ref 0.0–0.1)
Basophils Relative: 1 %
Eosinophils Absolute: 0.4 10*3/uL (ref 0.0–0.5)
Eosinophils Relative: 5 %
HCT: 40.8 % (ref 39.0–52.0)
Hemoglobin: 13.3 g/dL (ref 13.0–17.0)
Immature Granulocytes: 1 %
Lymphocytes Relative: 27 %
Lymphs Abs: 2 10*3/uL (ref 0.7–4.0)
MCH: 29 pg (ref 26.0–34.0)
MCHC: 32.6 g/dL (ref 30.0–36.0)
MCV: 88.9 fL (ref 80.0–100.0)
Monocytes Absolute: 0.7 10*3/uL (ref 0.1–1.0)
Monocytes Relative: 9 %
Neutro Abs: 4.2 10*3/uL (ref 1.7–7.7)
Neutrophils Relative %: 57 %
Platelets: 191 10*3/uL (ref 150–400)
RBC: 4.59 MIL/uL (ref 4.22–5.81)
RDW: 13.9 % (ref 11.5–15.5)
WBC: 7.3 10*3/uL (ref 4.0–10.5)
nRBC: 0 % (ref 0.0–0.2)

## 2020-07-07 LAB — COMPREHENSIVE METABOLIC PANEL
ALT: 18 U/L (ref 0–44)
AST: 22 U/L (ref 15–41)
Albumin: 3.4 g/dL — ABNORMAL LOW (ref 3.5–5.0)
Alkaline Phosphatase: 59 U/L (ref 38–126)
Anion gap: 8 (ref 5–15)
BUN: 18 mg/dL (ref 8–23)
CO2: 29 mmol/L (ref 22–32)
Calcium: 8.9 mg/dL (ref 8.9–10.3)
Chloride: 101 mmol/L (ref 98–111)
Creatinine, Ser: 0.74 mg/dL (ref 0.61–1.24)
GFR calc Af Amer: >60 mL/min (ref >60)
GFR calc non Af Amer: >60 mL/min (ref >60)
Glucose, Bld: 76 mg/dL (ref 70–99)
Potassium: 4.1 mmol/L (ref 3.5–5.1)
Sodium: 138 mmol/L (ref 135–145)
Total Bilirubin: 0.4 mg/dL (ref 0.3–1.2)
Total Protein: 6.9 g/dL (ref 6.5–8.1)

## 2020-07-07 LAB — TROPONIN I (HIGH SENSITIVITY)
Troponin I (High Sensitivity): 7 ng/L (ref <18)
Troponin I (High Sensitivity): 6 ng/L (ref <18)

## 2020-07-07 MED ORDER — ACETAMINOPHEN 500 MG PO TABS
1000.0000 mg | ORAL_TABLET | Freq: Once | ORAL | Status: AC
  Administered 2020-07-07: 1000 mg via ORAL
  Filled 2020-07-07: qty 2

## 2020-07-07 NOTE — ED Notes (Signed)
Discharge instructions reviewed with patient and wife. Verbalized understanding, no further questions for care team at this time.

## 2020-07-07 NOTE — ED Provider Notes (Signed)
[name] Provider Note   CSN: [id number] Arrival date & time: 07/07/20  0028     History Chief Complaint  Patient presents with  . Chest Pain    [name] is a 85 y.o. male.  Patient has a history of restless leg syndrome for years and continues to have that now but that is not new.  Patient states that he has had some chest pain recently.  Worse with movement.  Worse with certain positions.  States it feels like it sharp but also pressure like someone might be sit on it.  No other associated symptoms besides shortness of breath, nausea, vomiting, lightheadedness or diaphoresis.  No recent sick contacts.  No fever or cough.        Past Medical History:  Diagnosis Date  . Acid reflux 09/21/2016  . ALLERGIC RHINITIS 11/15/2008  . Asthma   . Bladder outlet obstruction 03/22/2013  . BPH (benign prostatic hypertrophy)   . Chronic airway obstruction (HCC) 11/14/2008   Methacholine challenge test for 08/04/13 while on GERD RX > neg  Overview:  Overview:  Methacholine challenge test for 08/04/13 while on GERD RX > neg  Last Assessment & Plan:  Please continue your pantoprazole 40mg  in the evening Continue your your loratadine daily You could consider increasing your fluticasone nasal spray to twice a day during the allergy months.  Use your albuterol 2 puffs as n  . Diabetes mellitus, type 2 ([place]) 09/21/2016  . Echocardiogram    Echo 12/18: EF 55-60, normal wall motion, grade 1 diastolic dysfunction, mild MR, mildly dilated RV  . ED (erectile dysfunction) of organic origin 09/21/2012  . Essential hypertension 11/15/2008  . Felon of finger of right hand 04/06/2016  . Gait difficulty   . History of hypoxemic respiratory failure 09/26/2017  . History of MI (myocardial infarction) 11/15/2008   Reported hx per patient - Patient did not have heart cath/PCI  . History of orthostatic hypotension   . Holter Monitor    Holter 8/18:  NSR, Frequent  PVCs, Occasional PACs  . Hyperlipidemia   . Increased frequency of urination 05/27/2017  . Leg weakness 04/13/2011  . Musculoskeletal symptoms referable to limbs 04/13/2011  . Nuclear stress test    Nuclear stress test 12/19: EF 57, no ischemia, +bowel atten artifact, Low Risk  . OSA (obstructive sleep apnea) 05/10/2015  . Paronychia of left middle finger 04/06/2016  . Phimosis 10/08/2016   Overview:  Added automatically from request for surgery [id number]  . Prostate cancer ([place]) 01/02/2009  . Rheumatic fever   . Right bundle branch block 03/17/2013  . Sleep apnea   . Syncope 06/01/2017    Patient Active Problem List   Diagnosis Date Noted  . Dementia with behavioral disturbance ([place]) 02/06/2020  . Peripheral arterial disease ([place]) 10/24/2019  . Pain due to onychomycosis of toenails of both feet 04/12/2019  . Frequent falls 03/09/2019  . Partial symptomatic epilepsy with complex partial seizures, not intractable, without status epilepticus ([place]) 09/20/2018  . Coronary artery calcification seen on CT scan 09/06/2018  . Aphasia 08/16/2018  . Gait abnormality 08/16/2018  . Subdural hematoma ([place]) 08/16/2018  . Erroneous encounter - disregard 10/05/2017  . Acute bronchitis 09/26/2017  . Acute hypoxemic respiratory failure ([place]) 09/26/2017  . Syncope 06/01/2017  . Increased frequency of urination 05/27/2017  . Phimosis 10/08/2016  . Asthma without status asthmaticus 09/21/2016  . Asthma attack 09/21/2016  . Diabetes mellitus, type 2 ([place]) 09/21/2016  .  Acid reflux 09/21/2016  . Personal history of prostate cancer 06/18/2016  . Felon of finger of right hand 04/06/2016  . Paronychia of left middle finger 04/06/2016  . OSA (obstructive sleep apnea) 05/10/2015  . Bladder outlet obstruction 03/22/2013  . Right bundle branch block 03/17/2013  . Incomplete emptying of bladder 09/22/2012  . ED (erectile dysfunction) of organic origin 09/21/2012  . Leg weakness 04/13/2011  . Musculoskeletal  symptoms referable to limbs 04/13/2011  . CA of prostate ([place]) 01/02/2009  . Cough 11/20/2008  . Hyperlipidemia 11/15/2008  . Benign essential HTN 11/15/2008  . ALLERGIC RHINITIS 11/15/2008  . Chronic airway obstruction ([place]) 11/14/2008    Past Surgical History:  Procedure Laterality Date  . APPENDECTOMY  1954  . CATARACT EXTRACTION Left 03/25/2017  . HERNIA REPAIR    . INCISION AND DRAINAGE ABSCESS Right 04/02/2016   Procedure: INCISION AND DRAINAGE ABSCESS;  Surgeon: Leanora Cover, MD;  Location: [facility];  Service: Orthopedics;  Laterality: Right;  Marland Kitchen VASECTOMY         Family History  Problem Relation Age of Onset  . Heart failure Mother   . Stroke Father        age 82  . Arthritis Other   . Diabetes Other   . Heart disease Other   . Hyperlipidemia Other   . Kidney disease Other   . Obesity Other   . Stroke Brother     Social History   Tobacco Use  . Smoking status: Former Smoker    Packs/day: 1.00    Years: 30.00    Pack years: 30.00    Types: Cigarettes, Pipe    Quit date: 10/19/2012    Years since quitting: 7.7  . Smokeless tobacco: Never Used  . Tobacco comment: 1 pipe every 3 days, quit smoking cig 12 yrs ago 07/06/13  Vaping Use  . Vaping Use: Never used  Substance Use Topics  . Alcohol use: No    Comment: no etoh in 12 years  . Drug use: No    Home Medications Prior to Admission medications   Medication Sig Start Date End Date Taking? Authorizing Provider  acetaminophen (TYLENOL) 500 MG tablet Take 500 mg by mouth 2 (two) times daily as needed for moderate pain.     [provider]  albuterol (PROVENTIL) (2.5 MG/3ML) 0.083% nebulizer solution Take 2.5 mg by nebulization daily as needed.  12/04/18   [provider]  ALPRAZolam Duanne Moron) 0.5 MG tablet Take 0.5 tablets by mouth 2 (two) times daily. 03/07/18   [provider]  aspirin EC 81 MG tablet Take 1 tablet by mouth daily.     [provider]    bisoprolol (ZEBETA) 5 MG tablet Take 0.5 tablets (2.5 mg total) by mouth daily. 01/11/20   Nahser, Wonda Cheng, MD  divalproex (DEPAKOTE) 500 MG DR tablet Take 1 tablet (500 mg total) by mouth 2 (two) times daily. 02/21/20   Marcial Pacas, MD  escitalopram (LEXAPRO) 20 MG tablet Take 20 mg by mouth daily. 10/04/19   [provider]  finasteride (PROSCAR) 5 MG tablet Take 5 mg by mouth daily.      [provider]  glucose blood (TRUETEST TEST) test strip  08/04/13   [provider]  latanoprost (XALATAN) 0.005 % ophthalmic solution Place 1 drop into both eyes daily.  08/07/16   [provider]  losartan (COZAAR) 25 MG tablet Take 1 tablet (25 mg total) by mouth daily. 09/05/19   Sarajane Jews,  Callie E, PA-C  magnesium oxide (MAG-OX) 400 MG tablet Take 400 mg by mouth daily.    [provider]  memantine (NAMENDA) 10 MG tablet Take 1 tablet (10 mg total) by mouth 2 (two) times daily. 09/25/19   Suzzanne Cloud, NP  metFORMIN (GLUCOPHAGE) 500 MG tablet Take 500 mg by mouth at bedtime.     [provider]  montelukast (SINGULAIR) 10 MG tablet Take 10 mg by mouth at bedtime.    [provider]  Multiple Vitamins-Minerals (PRESERVISION AREDS 2+MULTI VIT PO) Take 1 tablet by mouth 2 (two) times daily.    [provider]  nitroGLYCERIN (NITROSTAT) 0.4 MG SL tablet PLACE 1 TABLET UNDER THE TONGUE EVERY 5 MINS AS NEEDED FOR UP TO 3 DOSES IF PAIN CONTINUES,CALL 911 07/31/19   Nahser, Wonda Cheng, MD  pantoprazole (PROTONIX) 40 MG tablet TAKE 1 TABLET BY MOUTH DAILY. 01/13/16   Collene Gobble, MD  potassium chloride SA (K-DUR) 20 MEQ tablet Take 20 mEq by mouth daily.    [provider]  QUEtiapine (SEROQUEL) 25 MG tablet TAKE 1 TABLET BY MOUTH EVERYDAY AT BEDTIME 06/20/20   Marcial Pacas, MD  rosuvastatin (CRESTOR) 10 MG tablet TAKE 1 TABLET BY MOUTH EVERY DAY 05/25/19   Nahser, Wonda Cheng, MD  triamterene-hydrochlorothiazide (DYAZIDE) 37.5-25 MG capsule  Take 1 capsule by mouth every morning. 02/22/20   [provider]    Allergies    Doxycycline monohydrate, Penicillins, Fluticasone-salmeterol, Levetiracetam, Lisinopril, Prednisone, Shark cartilage, Tetracyclines & related, and Tetracycline  Review of Systems   Review of Systems  Constitutional: Negative for activity change, chills and fever.  HENT: Negative for congestion and rhinorrhea.   Eyes: Negative for visual disturbance.  Respiratory: Negative for cough and shortness of breath.   Cardiovascular: Negative for chest pain.  Gastrointestinal: Negative for abdominal pain, constipation, diarrhea and vomiting.  Endocrine: Negative for polyuria.  Genitourinary: Negative for dysuria and flank pain.  Musculoskeletal: Negative for back pain and neck pain.  Skin: Negative for wound.  Neurological: Negative for headaches.  All other systems reviewed and are negative.   Physical Exam Updated Vital Signs BP (!) 165/80   Pulse 62   Temp 97.7 F (36.5 C) (Oral)   Resp 18   Ht 5\' 10"  (1.778 m)   Wt 78 kg   SpO2 99%   BMI 24.68 kg/m   Physical Exam Vitals and nursing note reviewed.  Constitutional:      Appearance: He is well-developed.  HENT:     Head: Normocephalic and atraumatic.  Cardiovascular:     Rate and Rhythm: Normal rate.  Pulmonary:     Effort: Pulmonary effort is normal. No respiratory distress.     Breath sounds: No decreased breath sounds or wheezing.  Chest:     Chest wall: No mass, deformity, tenderness or crepitus.  Abdominal:     General: There is no distension.     Palpations: Abdomen is soft.  Musculoskeletal:        General: Normal range of motion.     Cervical back: Normal range of motion.  Skin:    General: Skin is warm and dry.  Neurological:     General: No focal deficit present.     Mental Status: He is alert.     Comments: Intermittent restless legs     ED Results / Procedures / Treatments   Labs (all labs ordered are listed,  but only abnormal results are displayed) Labs Reviewed  COMPREHENSIVE  METABOLIC PANEL - Abnormal; Notable for the following components:      Result Value   Albumin 3.4 (*)    All other components within normal limits  CBC WITH DIFFERENTIAL/PLATELET  TROPONIN I (HIGH SENSITIVITY)  TROPONIN I (HIGH SENSITIVITY)    EKG EKG Interpretation  Date/Time:  Sunday July 07 2020 01:14:42 EDT Ventricular Rate:  62 PR Interval:    QRS Duration: 143 QT Interval:  470 QTC Calculation: 478 R Axis:   -87 Text Interpretation: Ectopic atrial rhythm RBBB and LAFB No significant change since last tracing Confirmed by Merrily Pew [phone]) on 07/07/2020 2:13:04 AM   Radiology DG Chest Portable 1 View  Result Date: 07/07/2020 CLINICAL DATA:  Left-sided chest pain EXAM: PORTABLE CHEST 1 VIEW COMPARISON:  11/01/2019 FINDINGS: Single frontal view of the chest demonstrates an unremarkable cardiac silhouette. No airspace disease, effusion, or pneumothorax. No acute bony abnormalities. IMPRESSION: 1. No acute intrathoracic process. Electronically Signed   By: Randa Ngo M.D.   On: 07/07/2020 01:31    Procedures Procedures (including critical care time)  Medications Ordered in ED Medications  acetaminophen (TYLENOL) tablet 1,000 mg (1,000 mg Oral Given 07/07/20 0326)    ED Course  I have reviewed the triage vital signs and the nursing notes.  Pertinent labs & imaging results that were available during my care of the patient were reviewed by me and considered in my medical decision making (see chart for details).    MDM Rules/Calculators/A&P                          Nonspecific chest pain. Low concern for acs at this time. Low suspicion for PE. Workup otherwise unremarkable. Stable for dc w/ cardiology follow up.   Final Clinical Impression(s) / ED Diagnoses Final diagnoses:  Nonspecific chest pain    Rx / DC Orders ED Discharge Orders    None       Damin Salido, Corene Cornea, MD 07/08/20  0021

## 2020-07-07 NOTE — ED Triage Notes (Signed)
Pt arrived via [facility] with chestpain. Pt states chest pain started 10pm est time. Pt states he feels its in the left of his chest and feels like someone is sitting on him. Pt took aspirin prior to arriving to ED. Pt also has hx of restless leg sydrome.

## 2020-07-08 ENCOUNTER — Telehealth: Payer: Self-pay | Admitting: Cardiovascular Disease

## 2020-07-08 NOTE — Telephone Encounter (Signed)
I agree that patient needs to be seen by his PCP. This does not sound cardiac

## 2020-07-08 NOTE — Telephone Encounter (Signed)
I spoke with patient's daughter.  Patient was seen in ED over the weekend for chest pain and left side of torso pain.  Daughter reports patient continues to have the same type pain.  Pain never went away.  He cannot be left alone and is yelling out in pain at times.  Tylenol helped him sleep for about an hour last night. Patient also complains it hurts to use his left arm.  No numbness or weakness.  Does have a small bruise on arm.  No abdominal pain. Unable to get appointment with PCP today.  Cannot do video visit due to no cell service at the house. Daughter reports patient does have dementia but yelling out and carrying on is new since Saturday. Will review with Dr Acie Fredrickson.

## 2020-07-08 NOTE — Telephone Encounter (Signed)
I returned call and spoke with patient's wife.  I asked them to contact PCP again to see if patient could be seen today.  Wife thinks they will be able to see PCP tomorrow.  She will contact PCP office to schedule appointment.  Patient continues to yell out at times but there has been no worsening of his symptoms.

## 2020-07-08 NOTE — Telephone Encounter (Signed)
New message:    Patient daughter calling concerning her father. Patient went to the ER on Saturday night in a lot of pain. Please call patient.

## 2020-07-13 ENCOUNTER — Ambulatory Visit (INDEPENDENT_AMBULATORY_CARE_PROVIDER_SITE_OTHER): Payer: Medicare Other

## 2020-07-13 ENCOUNTER — Ambulatory Visit
Admission: RE | Admit: 2020-07-13 | Discharge: 2020-07-13 | Disposition: A | Discharge status: Home / Self Care | Payer: Medicare Other | Source: Ambulatory Visit | Attending: Emergency Medicine | Admitting: Emergency Medicine

## 2020-07-13 ENCOUNTER — Other Ambulatory Visit: Payer: Self-pay

## 2020-07-13 DIAGNOSIS — M25552 Pain in left hip: Secondary | ICD-10-CM

## 2020-07-13 DIAGNOSIS — W19XXXA Unspecified fall, initial encounter: Secondary | ICD-10-CM | POA: Diagnosis not present

## 2020-07-13 NOTE — Discharge Instructions (Signed)
take Tylenol as needed for pain Follow-up with PCP/orthopedic Follow RICE instruction days attached Return or go to ED for worsening of symptoms

## 2020-07-13 NOTE — ED Triage Notes (Signed)
Fall x 2 weeks ago, c/o LT hip pain

## 2020-07-13 NOTE — ED Provider Notes (Signed)
[place]   [id number] 07/13/20 Arrival Time: 3329   Chief Complaint  Patient presents with  . Hip Pain     SUBJECTIVE: History from: patient.  JEORGE REISTER is a 85 y.o. male presents to the urgent care with a complaint of left knee pain for the past 2 weeks.  I patient states she has a fall 2 weeks ago. Localized the pain to the left hip.  He describes the pain as constant and achy.  He has tried OTC medications without relief.  His symptoms are made worse with ROM.  He denies similar symptoms in the past. Denies chills, fever, nausea, vomiting, diarrhea      ROS: As per HPI.  All other pertinent ROS negative.     Past Medical History:  Diagnosis Date  . Acid reflux 09/21/2016  . ALLERGIC RHINITIS 11/15/2008  . Asthma   . Bladder outlet obstruction 03/22/2013  . BPH (benign prostatic hypertrophy)   . Chronic airway obstruction (HCC) 11/14/2008   Methacholine challenge test for 08/04/13 while on GERD RX > neg  Overview:  Overview:  Methacholine challenge test for 08/04/13 while on GERD RX > neg  Last Assessment & Plan:  Please continue your pantoprazole 40mg  in the evening Continue your your loratadine daily You could consider increasing your fluticasone nasal spray to twice a day during the allergy months.  Use your albuterol 2 puffs as n  . Diabetes mellitus, type 2 ([place]) 09/21/2016  . Echocardiogram    Echo 12/18: EF 55-60, normal wall motion, grade 1 diastolic dysfunction, mild MR, mildly dilated RV  . ED (erectile dysfunction) of organic origin 09/21/2012  . Essential hypertension 11/15/2008  . Felon of finger of right hand 04/06/2016  . Gait difficulty   . History of hypoxemic respiratory failure 09/26/2017  . History of MI (myocardial infarction) 11/15/2008   Reported hx per patient - Patient did not have heart cath/PCI  . History of orthostatic hypotension   . Holter Monitor    Holter 8/18:  NSR, Frequent PVCs, Occasional PACs  . Hyperlipidemia   .  Increased frequency of urination 05/27/2017  . Leg weakness 04/13/2011  . Musculoskeletal symptoms referable to limbs 04/13/2011  . Nuclear stress test    Nuclear stress test 12/19: EF 57, no ischemia, +bowel atten artifact, Low Risk  . OSA (obstructive sleep apnea) 05/10/2015  . Paronychia of left middle finger 04/06/2016  . Phimosis 10/08/2016   Overview:  Added automatically from request for surgery [id number]  . Prostate cancer ([place]) 01/02/2009  . Rheumatic fever   . Right bundle branch block 03/17/2013  . Sleep apnea   . Syncope 06/01/2017   Past Surgical History:  Procedure Laterality Date  . APPENDECTOMY  1954  . CATARACT EXTRACTION Left 03/25/2017  . HERNIA REPAIR    . INCISION AND DRAINAGE ABSCESS Right 04/02/2016   Procedure: INCISION AND DRAINAGE ABSCESS;  Surgeon: Leanora Cover, MD;  Location: [facility];  Service: Orthopedics;  Laterality: Right;  Marland Kitchen VASECTOMY     Allergies  Allergen Reactions  . Doxycycline Monohydrate Hives  . Penicillins Hives    Has patient had a PCN reaction causing immediate rash, facial/tongue/throat swelling, SOB or lightheadedness with hypotension: No Has patient had a PCN reaction causing severe rash involving mucus membranes or skin necrosis: Yes Has patient had a PCN reaction that required hospitalization: No Has patient had a PCN reaction occurring within the last 10 years: No If all of the above answers are "  NO", then may proceed with Cephalosporin use.   Marland Kitchen Fluticasone-Salmeterol     REACTION: sensation of throat closing REACTION: sensation of throat closing  . Levetiracetam Other (See Comments)    aggitation  . Lisinopril Cough  . Prednisone     Per DX:IPJASNK agitation Per NL:ZJQBHAL agitation  . Shark Cartilage   . Tetracyclines & Related Hives  . Tetracycline Rash   No current facility-administered medications on file prior to encounter.   Current Outpatient Medications on File Prior to Encounter  Medication Sig  Dispense Refill  . acetaminophen (TYLENOL) 500 MG tablet Take 500 mg by mouth 2 (two) times daily as needed for moderate pain.     Marland Kitchen albuterol (PROVENTIL) (2.5 MG/3ML) 0.083% nebulizer solution Take 2.5 mg by nebulization daily as needed.     . ALPRAZolam (XANAX) 0.5 MG tablet Take 0.5 tablets by mouth 2 (two) times daily.    Marland Kitchen aspirin EC 81 MG tablet Take 1 tablet by mouth daily.     . bisoprolol (ZEBETA) 5 MG tablet Take 0.5 tablets (2.5 mg total) by mouth daily. 45 tablet 3  . divalproex (DEPAKOTE) 500 MG DR tablet Take 1 tablet (500 mg total) by mouth 2 (two) times daily. 60 tablet 11  . escitalopram (LEXAPRO) 20 MG tablet Take 20 mg by mouth daily.    . finasteride (PROSCAR) 5 MG tablet Take 5 mg by mouth daily.      Marland Kitchen glucose blood (TRUETEST TEST) test strip     . latanoprost (XALATAN) 0.005 % ophthalmic solution Place 1 drop into both eyes daily.     Marland Kitchen losartan (COZAAR) 25 MG tablet Take 1 tablet (25 mg total) by mouth daily.    . magnesium oxide (MAG-OX) 400 MG tablet Take 400 mg by mouth daily.    . memantine (NAMENDA) 10 MG tablet Take 1 tablet (10 mg total) by mouth 2 (two) times daily. 180 tablet 4  . metFORMIN (GLUCOPHAGE) 500 MG tablet Take 500 mg by mouth at bedtime.     . montelukast (SINGULAIR) 10 MG tablet Take 10 mg by mouth at bedtime.    . Multiple Vitamins-Minerals (PRESERVISION AREDS 2+MULTI VIT PO) Take 1 tablet by mouth 2 (two) times daily.    . nitroGLYCERIN (NITROSTAT) 0.4 MG SL tablet PLACE 1 TABLET UNDER THE TONGUE EVERY 5 MINS AS NEEDED FOR UP TO 3 DOSES IF PAIN CONTINUES,CALL 911 25 tablet 10  . pantoprazole (PROTONIX) 40 MG tablet TAKE 1 TABLET BY MOUTH DAILY. 90 tablet 0  . potassium chloride SA (K-DUR) 20 MEQ tablet Take 20 mEq by mouth daily.    . QUEtiapine (SEROQUEL) 25 MG tablet TAKE 1 TABLET BY MOUTH EVERYDAY AT BEDTIME 90 tablet 1  . rosuvastatin (CRESTOR) 10 MG tablet TAKE 1 TABLET BY MOUTH EVERY DAY 90 tablet 3  . triamterene-hydrochlorothiazide  (DYAZIDE) 37.5-25 MG capsule Take 1 capsule by mouth every morning.     Social History   Socioeconomic History  . Marital status: Married    Spouse name: Not on file  . Number of children: 2  . Years of education: 3 years of college  . Highest education level: Not on file  Occupational History  . Occupation: retired    Comment: Chartered certified accountant for [employer]  . Smoking status: Former Smoker    Packs/day: 1.00    Years: 30.00    Pack years: 30.00    Types: Cigarettes, Pipe    Quit date: 10/19/2012    Years  since quitting: 7.7  . Smokeless tobacco: Never Used  . Tobacco comment: 1 pipe every 3 days, quit smoking cig 12 yrs ago 07/06/13  Vaping Use  . Vaping Use: Never used  Substance and Sexual Activity  . Alcohol use: No    Comment: no etoh in 12 years  . Drug use: No  . Sexual activity: Not on file  Other Topics Concern  . Not on file  Social History Narrative   Lives at home with his wife.   Right-handed.   3 cups caffeine per day.   Social Determinants of Health   Financial Resource Strain:   . Difficulty of Paying Living Expenses: Not on file  Food Insecurity:   . Worried About Charity fundraiser in the Last Year: Not on file  . Ran Out of Food in the Last Year: Not on file  Transportation Needs:   . Lack of Transportation (Medical): Not on file  . Lack of Transportation (Non-Medical): Not on file  Physical Activity:   . Days of Exercise per Week: Not on file  . Minutes of Exercise per Session: Not on file  Stress:   . Feeling of Stress : Not on file  Social Connections:   . Frequency of Communication with Friends and Family: Not on file  . Frequency of Social Gatherings with Friends and Family: Not on file  . Attends Religious Services: Not on file  . Active Member of Clubs or Organizations: Not on file  . Attends Archivist Meetings: Not on file  . Marital Status: Not on file  Intimate Partner Violence:   . Fear of Current or  Ex-Partner: Not on file  . Emotionally Abused: Not on file  . Physically Abused: Not on file  . Sexually Abused: Not on file   Family History  Problem Relation Age of Onset  . Heart failure Mother   . Stroke Father        age 70  . Arthritis Other   . Diabetes Other   . Heart disease Other   . Hyperlipidemia Other   . Kidney disease Other   . Obesity Other   . Stroke Brother     OBJECTIVE:  There were no vitals filed for this visit.   Physical Exam Vitals and nursing note reviewed.  Constitutional:      General: He is not in acute distress.    Appearance: Normal appearance. He is normal weight. He is not ill-appearing, toxic-appearing or diaphoretic.  Cardiovascular:     Rate and Rhythm: Normal rate and regular rhythm.     Pulses: Normal pulses.     Heart sounds: Normal heart sounds. No murmur heard.  No friction rub. No gallop.   Pulmonary:     Effort: Pulmonary effort is normal. No respiratory distress.     Breath sounds: Normal breath sounds. No stridor. No wheezing, rhonchi or rales.  Chest:     Chest wall: No tenderness.  Musculoskeletal:        General: Tenderness present.     Right hip: Normal.     Left hip: Tenderness present.     Comments: Patient is unable to bear weight. The left knee is without obvious asymmetry or deformity compared to the right knees. Unable to assess range of motion. Neurovascular status intact.  Neurological:     Mental Status: He is alert and oriented to person, place, and time.     LABS:  No results found for this or  any previous visit (from the past 24 hour(s)).   RADIOLOGY:  DG Hip Unilat W or Wo Pelvis 1 View Left  Result Date: 07/13/2020 CLINICAL DATA:  Fall today with history of fall 2 weeks ago with persistent left hip pain EXAM: DG HIP (WITH OR WITHOUT PELVIS) 3V*L* COMPARISON:  None. FINDINGS: Pelvic ring is intact. Prostate therapy seeds are seen. Degenerative changes of the hip joints are noted. No acute fracture or  dislocation is noted. No soft tissue changes are seen. IMPRESSION: No acute abnormality noted. Electronically Signed   By: Inez Catalina M.D.   On: 07/13/2020 15:26   Left hip x-ray is negative for bony abnormality including fracture or dislocation.  I have reviewed the x-ray myself and the radiologist interpretation.  I am in agreement with the radiologist interpretation.  ASSESSMENT & PLAN:  1. Left hip pain   2. Fall, initial encounter     No orders of the defined types were placed in this encounter.   Discharge Instructions  take Tylenol as needed for pain Follow-up with PCP/orthopedic Follow RICE instruction days attached Return or go to ED for worsening of symptoms   Reviewed expectations re: course of current medical issues. Questions answered. Outlined signs and symptoms indicating need for more acute intervention. Patient verbalized understanding. After Visit Summary given.         Emerson Monte, [place] 07/13/20 1601

## 2020-07-18 ENCOUNTER — Ambulatory Visit (INDEPENDENT_AMBULATORY_CARE_PROVIDER_SITE_OTHER): Payer: Medicare Other | Admitting: Neurology

## 2020-07-18 ENCOUNTER — Encounter: Payer: Self-pay | Admitting: Neurology

## 2020-07-18 VITALS — BP 123/68 | HR 87

## 2020-07-18 DIAGNOSIS — F0391 Unspecified dementia with behavioral disturbance: Secondary | ICD-10-CM

## 2020-07-18 DIAGNOSIS — S065XAA Traumatic subdural hemorrhage with loss of consciousness status unknown, initial encounter: Secondary | ICD-10-CM

## 2020-07-18 DIAGNOSIS — R269 Unspecified abnormalities of gait and mobility: Secondary | ICD-10-CM | POA: Diagnosis not present

## 2020-07-18 DIAGNOSIS — G40209 Localization-related (focal) (partial) symptomatic epilepsy and epileptic syndromes with complex partial seizures, not intractable, without status epilepticus: Secondary | ICD-10-CM | POA: Diagnosis not present

## 2020-07-18 DIAGNOSIS — S065X9A Traumatic subdural hemorrhage with loss of consciousness of unspecified duration, initial encounter: Secondary | ICD-10-CM | POA: Diagnosis not present

## 2020-07-18 MED ORDER — QUETIAPINE FUMARATE 25 MG PO TABS: 50.0000 mg | ORAL_TABLET | Freq: Every day | ORAL | 11 refills | Status: DC

## 2020-07-18 MED ORDER — ARIPIPRAZOLE 5 MG PO TABS
5.0000 mg | ORAL_TABLET | Freq: Every day | ORAL | 11 refills | Status: DC
Start: 2020-07-18 — End: 2020-08-06

## 2020-07-18 MED ORDER — DIVALPROEX SODIUM 500 MG PO DR TAB: 500.0000 mg | DELAYED_RELEASE_TABLET | Freq: Two times a day (BID) | ORAL | 4 refills | Status: AC
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
## 2020-07-20 ENCOUNTER — Encounter: Payer: Self-pay | Admitting: Dermatology

## 2020-07-20 NOTE — Progress Notes (Signed)
   New Patient   Subjective  [name] [name] is a 85 y.o. male who presents for the following: Establish Care and Skin Problem (itching all over--noticed for about 4 weeks).  Itching Location: All over Duration: At least many months Quality:  Associated Signs/Symptoms: Modifying Factors:  Severity:  Timing: No precipitating factors Context: No close contacts similarly affected   The following portions of the chart were reviewed this encounter and updated as appropriate: Tobacco  Allergies  Meds  Problems  Med Hx  Surg Hx  Fam Hx      Objective  Well appearing patient in no apparent distress; mood and affect are within normal limits.  A full examination was performed including scalp, head, eyes, ears, nose, lips, neck, chest, axillae, abdomen, back, buttocks, bilateral upper extremities, bilateral lower extremities, hands, feet, fingers, toes, fingernails, and toenails. All findings within normal limits unless otherwise noted below. Also mucosa, nail beds, lymph nodes.   Assessment & Plan   First visit for [name] date of birth 03/22/1935.  He has had sometimes generalized itching for many months or perhaps years.  This will come and go rather randomly and neither he nor his wife [name] [name] (who was present throughout the visit) ever sees a rash preceding the itch.  No close contacts are itching.  His grandchildren are adults and the first great grandchild will soon appear, but there is no known exposure to itchy people or scabies.  Examination was remarkable for an absence of cutaneous findings.  There is no sign of scabies or hives or dermatitis.  There is no dermatographia.  This may be primary pruritus and not from his skin.  We discussed 3 possible treatment options: #1 topical pramoxine.  They will apply CeraVe  itch relief to all areas prone to itch daily immediately after bathing and this may be applied anywhere on the skin as many times as they would like when there  is an active itch.  There are no known side effects.  Should this fail, it will be up to his primary care doctor whether he is a potential candidate to try a neuroactive agent like gabapentin (off label).  Although this may decrease itching similar to decreasing pain with peripheral neuropathy, the doses required concern me because of his history of cognitive dysfunction and falling.  The family will contact me in 2 weeks to give me a follow-up.  Finally #3 we discussed the potential for general body narrowband UVB treatment, but this requires being able to stand in an ultraviolet unit for up to 15 minutes twice a week and this simply would not be practical for Mr. Budney.  Initial follow-up by phone in 2 weeks.

## 2020-07-24 ENCOUNTER — Ambulatory Visit: Payer: Medicare Other | Admitting: Podiatry

## 2020-07-24 ENCOUNTER — Other Ambulatory Visit: Payer: Self-pay

## 2020-07-24 ENCOUNTER — Encounter: Payer: Self-pay | Admitting: Podiatry

## 2020-07-24 DIAGNOSIS — B351 Tinea unguium: Secondary | ICD-10-CM

## 2020-07-24 DIAGNOSIS — M79674 Pain in right toe(s): Secondary | ICD-10-CM | POA: Diagnosis not present

## 2020-07-24 DIAGNOSIS — E119 Type 2 diabetes mellitus without complications: Secondary | ICD-10-CM | POA: Diagnosis not present

## 2020-07-24 DIAGNOSIS — M79675 Pain in left toe(s): Secondary | ICD-10-CM

## 2020-07-24 NOTE — Progress Notes (Signed)
This patient returns to the office for evaluation and treatment of long thick painful nails .  This patient is unable to trim his own nails since the patient cannot reach the feet.  Patient says the nails are painful walking and wearing his shoes.  He returns for preventive foot care services.  General Appearance  Alert, conversant and in no acute stress.  Vascular  Dorsalis pedis and posterior tibial  pulses are palpable  bilaterally.  Capillary return is within normal limits  bilaterally. Temperature is within normal limits  bilaterally.  Neurologic  Senn-Weinstein monofilament wire test within normal limits  bilaterally. Muscle power within normal limits bilaterally.  Nails Thick disfigured discolored nails with subungual debris  from hallux to fifth toes bilaterally. No evidence of bacterial infection or drainage bilaterally.  Orthopedic  No limitations of motion  feet .  No crepitus or effusions noted.  No bony pathology or digital deformities noted.  Skin  normotropic skin with no porokeratosis noted bilaterally.  No signs of infections or ulcers noted.     Onychomycosis  Pain in toes right foot  Pain in toes left foot  Debridement  of nails  1-5  B/L with a nail nipper.  Nails were then filed using a dremel tool with no incidents.    RTC 3 months    Precious Gilchrest DPM  

## 2020-08-05 ENCOUNTER — Telehealth: Payer: Self-pay | Admitting: Neurology

## 2020-08-05 NOTE — Telephone Encounter (Signed)
Pt's daughter called wanting to know if the nurse can call the pt's wife to discuss his QUEtiapine (SEROQUEL) 25 MG tablet. She states that he started off well on it but now he is back to cursing, agitation etc. Please advise.

## 2020-08-05 NOTE — Telephone Encounter (Signed)
I spoke to the patient's dgt on DPR Sallee Lange - contact [phone]). Reports her father is having more outbursts than when he started Seroquel 25mg  tablets. The prescription was written for two tablets at bedtime. Her mother has only been giving him one tablet at bedtime. They are going to try to increase it to the prescribed amount to see if it will be helpful. If not, I instructed her to call us back. She was in agreement with this plan.

## 2020-08-06 MED ORDER — ARIPIPRAZOLE 10 MG PO TABS
10.0000 mg | ORAL_TABLET | Freq: Every day | ORAL | 11 refills | Status: DC
Start: 2020-08-06 — End: 2020-09-05

## 2020-08-06 NOTE — Telephone Encounter (Signed)
The patient's family is reporting increased daytime/evening outbursts - agitation, raising voice, cursing, etc. His wife had only been giving him one tablet of Seroquel 25mg  at bedtime until last night. She tried two tablets with no improvement in behavior.

## 2020-08-06 NOTE — Telephone Encounter (Signed)
Pt's wife called stating that she gave him the 2 tablets and the pt is worse today than he was yesterday. She would like to speak to RN. Please advise.

## 2020-08-06 NOTE — Telephone Encounter (Signed)
From reviewing the chart, he is also taking Depakote ER 500 mg twice a day, Abilify 5 mg daily, Seroquel 25 mg up to 2 tablets every night, Xanax 0.5 mg twice a day  From last office visit on July 18, 2020, we have made some modification adjustment, stop Lexapro, start Abilify, increase Seroquel, Xanax as needed  Please check with family current medication he is taking, and also verify that any of the change make any difference in his agitations, and the timing of the his behavior issues  The choices are  1, increase Abilify to 10 mg daily  2, keep Seroquel 25 mg at evening time, keep Seroquel 2 tablets at bedtime  3, also check to see if Xanax as needed was helpful

## 2020-08-06 NOTE — Addendum Note (Signed)
Addended by: Noberto Retort C on: 08/06/2020 02:45 PM   Modules accepted: Orders

## 2020-08-06 NOTE — Telephone Encounter (Addendum)
The patient's wife is in agreement to increasing his Abilify to 10mg  daily and continuing Seroquel 25mg , two tablets at bedtime. The patient has some days that are better than others. The medications are some helpful. Recently, he has been having more outburst during the day. At times, he has issues at night.  States his PCP is prescribing his Xanax 0.5mg , 0.5 tablets BID PRN. She uses it sparingly and it works well when he is having a bad day. She will also continue this plan.  Additionally, she reports the patient is going to see a psychiatrist. States this was recommended previously by Dr. Krista Blue and she is now able to get him to go. His PCP is helping with the referral.

## 2020-08-11 ENCOUNTER — Ambulatory Visit
Admission: RE | Admit: 2020-08-11 | Discharge: 2020-08-11 | Disposition: A | Discharge status: Home / Self Care | Payer: Medicare Other | Source: Ambulatory Visit | Attending: Emergency Medicine | Admitting: Emergency Medicine

## 2020-08-11 ENCOUNTER — Other Ambulatory Visit: Payer: Self-pay

## 2020-08-11 ENCOUNTER — Ambulatory Visit (INDEPENDENT_AMBULATORY_CARE_PROVIDER_SITE_OTHER): Payer: Medicare Other

## 2020-08-11 VITALS — BP 112/76 | HR 92 | Temp 98.3°F | Resp 19

## 2020-08-11 DIAGNOSIS — M25521 Pain in right elbow: Secondary | ICD-10-CM | POA: Diagnosis not present

## 2020-08-11 DIAGNOSIS — M533 Sacrococcygeal disorders, not elsewhere classified: Secondary | ICD-10-CM

## 2020-08-11 DIAGNOSIS — W19XXXA Unspecified fall, initial encounter: Secondary | ICD-10-CM | POA: Diagnosis not present

## 2020-08-11 NOTE — ED Provider Notes (Signed)
[place]   [id number] 08/11/20 Arrival Time: 5  Chief Complaint  Patient presents with   Fall     SUBJECTIVE: History from: patient and family. [name] [name] is a 85 y.o. male who presents to the urgent care for complaint of fall for the past 1 week.  Patient is reporting pain on right elbow and coccyx area.  Report history of frequent falling. Does not recall hitting head, losing consciousness or passing out.  Denies LOC . Denies sensation changes, motor weakness, neurological impairment, amaurosis, diplopia, dysphasia, severe HA, loss of balance, slurred speech, facial asymmetry, chest pain, SOB, flank pain, abdominal pain, changes in bowel or bladder habits   ROS: As per HPI.  All other pertinent ROS negative.     Past Medical History:  Diagnosis Date   Acid reflux 09/21/2016   ALLERGIC RHINITIS 11/15/2008   Asthma    Bladder outlet obstruction 03/22/2013   BPH (benign prostatic hypertrophy)    Chronic airway obstruction ([place]) 11/14/2008   Methacholine challenge test for 08/04/13 while on GERD RX > neg  Overview:  Overview:  Methacholine challenge test for 08/04/13 while on GERD RX > neg  Last Assessment & Plan:  Please continue your pantoprazole 40mg  in the evening Continue your your loratadine daily You could consider increasing your fluticasone nasal spray to twice a day during the allergy months.  Use your albuterol 2 puffs as n   Diabetes mellitus, type 2 ([place]) 09/21/2016   Echocardiogram    Echo 12/18: EF 55-60, normal wall motion, grade 1 diastolic dysfunction, mild MR, mildly dilated RV   ED (erectile dysfunction) of organic origin 09/21/2012   Essential hypertension 11/15/2008   Felon of finger of right hand 04/06/2016   Gait difficulty    History of hypoxemic respiratory failure 09/26/2017   History of MI (myocardial infarction) 11/15/2008   Reported hx per patient - Patient did not have heart cath/PCI   History of orthostatic hypotension      Holter Monitor    Holter 8/18:  NSR, Frequent PVCs, Occasional PACs   Hyperlipidemia    Increased frequency of urination 05/27/2017   Leg weakness 04/13/2011   Musculoskeletal symptoms referable to limbs 04/13/2011   Nuclear stress test    Nuclear stress test 12/19: EF 57, no ischemia, +bowel atten artifact, Low Risk   OSA (obstructive sleep apnea) 05/10/2015   Paronychia of left middle finger 04/06/2016   Phimosis 10/08/2016   Overview:  Added automatically from request for surgery [id number]   Prostate cancer ([place]) 01/02/2009   Rheumatic fever    Right bundle branch block 03/17/2013   Sleep apnea    Syncope 06/01/2017   Past Surgical History:  Procedure Laterality Date   APPENDECTOMY  1954   CATARACT EXTRACTION Left 03/25/2017   HERNIA REPAIR     INCISION AND DRAINAGE ABSCESS Right 04/02/2016   Procedure: INCISION AND DRAINAGE ABSCESS;  Surgeon: Leanora Cover, MD;  Location: [facility];  Service: Orthopedics;  Laterality: Right;   VASECTOMY     Allergies  Allergen Reactions   Doxycycline Monohydrate Hives   Penicillins Hives    Has patient had a PCN reaction causing immediate rash, facial/tongue/throat swelling, SOB or lightheadedness with hypotension: No Has patient had a PCN reaction causing severe rash involving mucus membranes or skin necrosis: Yes Has patient had a PCN reaction that required hospitalization: No Has patient had a PCN reaction occurring within the last 10 years: No If all of the above answers  are "NO", then may proceed with Cephalosporin use.    Fluticasone-Salmeterol     REACTION: sensation of throat closing REACTION: sensation of throat closing   Levetiracetam Other (See Comments)    aggitation   Lisinopril Cough   Prednisone     Per HK:VQQVZDG agitation Per LO:VFIEPPI agitation   Shark Cartilage    Tetracyclines & Related Hives   Tetracycline Rash   No current facility-administered medications on file prior to  encounter.   Current Outpatient Medications on File Prior to Encounter  Medication Sig Dispense Refill   acetaminophen (TYLENOL) 500 MG tablet Take 500 mg by mouth 2 (two) times daily as needed for moderate pain.      albuterol (PROVENTIL) (2.5 MG/3ML) 0.083% nebulizer solution Take 2.5 mg by nebulization daily as needed.      ALPRAZolam (XANAX) 0.5 MG tablet Take 0.5 tablets by mouth 2 (two) times daily.     ARIPiprazole (ABILIFY) 10 MG tablet Take 1 tablet (10 mg total) by mouth daily. 30 tablet 11   aspirin EC 81 MG tablet Take 1 tablet by mouth daily.      bisoprolol (ZEBETA) 5 MG tablet Take 0.5 tablets (2.5 mg total) by mouth daily. 45 tablet 3   divalproex (DEPAKOTE) 500 MG DR tablet Take 1 tablet (500 mg total) by mouth 2 (two) times daily. 180 tablet 4   finasteride (PROSCAR) 5 MG tablet Take 5 mg by mouth daily.       glucose blood (TRUETEST TEST) test strip      latanoprost (XALATAN) 0.005 % ophthalmic solution Place 1 drop into both eyes daily.      losartan (COZAAR) 25 MG tablet Take 1 tablet (25 mg total) by mouth daily.     magnesium oxide (MAG-OX) 400 MG tablet Take 400 mg by mouth daily.     memantine (NAMENDA) 10 MG tablet Take 1 tablet (10 mg total) by mouth 2 (two) times daily. 180 tablet 4   montelukast (SINGULAIR) 10 MG tablet Take 10 mg by mouth at bedtime.     Multiple Vitamins-Minerals (PRESERVISION AREDS 2+MULTI VIT PO) Take 1 tablet by mouth 2 (two) times daily.     nitroGLYCERIN (NITROSTAT) 0.4 MG SL tablet PLACE 1 TABLET UNDER THE TONGUE EVERY 5 MINS AS NEEDED FOR UP TO 3 DOSES IF PAIN CONTINUES,CALL 911 25 tablet 10   pantoprazole (PROTONIX) 40 MG tablet TAKE 1 TABLET BY MOUTH DAILY. 90 tablet 0   potassium chloride SA (K-DUR) 20 MEQ tablet Take 20 mEq by mouth daily.     QUEtiapine (SEROQUEL) 25 MG tablet Take 2 tablets (50 mg total) by mouth at bedtime. 60 tablet 11   rosuvastatin (CRESTOR) 10 MG tablet TAKE 1 TABLET BY MOUTH EVERY DAY 90  tablet 3   tiZANidine (ZANAFLEX) 4 MG tablet Take 4 mg by mouth daily.     triamterene-hydrochlorothiazide (DYAZIDE) 37.5-25 MG capsule Take 1 capsule by mouth every morning.     Social History   Socioeconomic History   Marital status: Married    Spouse name: Not on file   Number of children: 2   Years of education: 3 years of college   Highest education level: Not on file  Occupational History   Occupation: retired    Comment: Chartered certified accountant for [employer]   Smoking status: Former Smoker    Packs/day: 1.00    Years: 30.00    Pack years: 30.00    Types: Cigarettes, Pipe    Quit date:  10/19/2012    Years since quitting: 7.8   Smokeless tobacco: Never Used   Tobacco comment: 1 pipe every 3 days, quit smoking cig 12 yrs ago 07/06/13  Vaping Use   Vaping Use: Never used  Substance and Sexual Activity   Alcohol use: No    Comment: no etoh in 12 years   Drug use: No   Sexual activity: Not on file  Other Topics Concern   Not on file  Social History Narrative   Lives at home with his wife.   Right-handed.   3 cups caffeine per day.   Social Determinants of Health   Financial Resource Strain:    Difficulty of Paying Living Expenses: Not on file  Food Insecurity:    Worried About Charity fundraiser in the Last Year: Not on file   [employer] of Food in the Last Year: Not on file  Transportation Needs:    Lack of Transportation (Medical): Not on file   Lack of Transportation (Non-Medical): Not on file  Physical Activity:    Days of Exercise per Week: Not on file   Minutes of Exercise per Session: Not on file  Stress:    Feeling of Stress : Not on file  Social Connections:    Frequency of Communication with Friends and Family: Not on file   Frequency of Social Gatherings with Friends and Family: Not on file   Attends Religious Services: Not on file   Active Member of Clubs or Organizations: Not on file   Attends Archivist  Meetings: Not on file   Marital Status: Not on file  Intimate Partner Violence:    Fear of Current or Ex-Partner: Not on file   Emotionally Abused: Not on file   Physically Abused: Not on file   Sexually Abused: Not on file   Family History  Problem Relation Age of Onset   Heart failure Mother    Stroke Father        age 29   Arthritis Other    Diabetes Other    Heart disease Other    Hyperlipidemia Other    Kidney disease Other    Obesity Other    Stroke Brother     OBJECTIVE:  Vitals:   08/11/20 1206  BP: 112/76  Pulse: 92  Resp: 19  Temp: 98.3 F (36.8 C)  TempSrc: Oral  SpO2: 95%     Physical Exam Vitals and nursing note reviewed.  Constitutional:      General: He is not in acute distress.    Appearance: Normal appearance. He is normal weight. He is not ill-appearing, toxic-appearing or diaphoretic.  Cardiovascular:     Rate and Rhythm: Normal rate and regular rhythm.     Pulses: Normal pulses.     Heart sounds: Normal heart sounds. No murmur heard.  No friction rub. No gallop.   Pulmonary:     Effort: Pulmonary effort is normal. No respiratory distress.     Breath sounds: Normal breath sounds. No stridor. No wheezing, rhonchi or rales.  Chest:     Chest wall: No tenderness.  Musculoskeletal:        General: Tenderness present.     Right elbow: No swelling, deformity, effusion or lacerations. Tenderness present.     Left elbow: Normal.     Lumbar back: Tenderness present. No deformity or spasms.     Comments: Right elbow is without any obvious deformity when compared to the left elbow.  There is  no swelling, ecchymosis, open wound, warmth or lesion present.  Normal range of motion with pain neurovascular status intact.  Neurological:     Mental Status: He is alert and oriented to person, place, and time.     Results for orders placed or performed during the hospital encounter of 07/07/20  CBC with Differential  Result Value Ref Range    WBC 7.3 4.0 - 10.5 K/uL   RBC 4.59 4.22 - 5.81 MIL/uL   Hemoglobin 13.3 13.0 - 17.0 g/dL   HCT 40.8 39 - 52 %   MCV 88.9 80.0 - 100.0 fL   MCH 29.0 26.0 - 34.0 pg   MCHC 32.6 30.0 - 36.0 g/dL   RDW 13.9 11.5 - 15.5 %   Platelets 191 150 - 400 K/uL   nRBC 0.0 0.0 - 0.2 %   Neutrophils Relative % 57 %   Neutro Abs 4.2 1.7 - 7.7 K/uL   Lymphocytes Relative 27 %   Lymphs Abs 2.0 0.7 - 4.0 K/uL   Monocytes Relative 9 %   Monocytes Absolute 0.7 0.1 - 1.0 K/uL   Eosinophils Relative 5 %   Eosinophils Absolute 0.4 0.0 - 0.5 K/uL   Basophils Relative 1 %   Basophils Absolute 0.1 0.0 - 0.1 K/uL   Immature Granulocytes 1 %   Abs Immature Granulocytes 0.04 0.00 - 0.07 K/uL  Comprehensive metabolic panel  Result Value Ref Range   Sodium 138 135 - 145 mmol/L   Potassium 4.1 3.5 - 5.1 mmol/L   Chloride 101 98 - 111 mmol/L   CO2 29 22 - 32 mmol/L   Glucose, Bld 76 70 - 99 mg/dL   BUN 18 8 - 23 mg/dL   Creatinine, Ser 0.74 0.61 - 1.24 mg/dL   Calcium 8.9 8.9 - 10.3 mg/dL   Total Protein 6.9 6.5 - 8.1 g/dL   Albumin 3.4 (L) 3.5 - 5.0 g/dL   AST 22 15 - 41 U/L   ALT 18 0 - 44 U/L   Alkaline Phosphatase 59 38 - 126 U/L   Total Bilirubin 0.4 0.3 - 1.2 mg/dL   GFR calc non Af Amer >60 >60 mL/min   GFR calc Af Amer >60 >60 mL/min   Anion gap 8 5 - 15  Troponin I (High Sensitivity)  Result Value Ref Range   Troponin I (High Sensitivity) 7 <18 ng/L  Troponin I (High Sensitivity)  Result Value Ref Range   Troponin I (High Sensitivity) 6 <18 ng/L    Labs Reviewed - No data to display  No results found.  RADIOLOGY:  DG Sacrum/Coccyx  Result Date: 08/11/2020 CLINICAL DATA:  Fall 1 week ago with tailbone pain. EXAM: SACRUM AND COCCYX - 2+ VIEW COMPARISON:  Abdomen and pelvis CT 11/01/2019 FINDINGS: Posterior positioning of the coccyx relative to the sacrum, alignment unchanged from comparison CT. No visible fracture or bone lesion. No sacroiliac joints or symphysis pubis diastasis.  Prostate brachytherapy seeds. IMPRESSION: No acute finding when compared to prior CT. Electronically Signed   By: Monte Fantasia M.D.   On: 08/11/2020 13:10     Coccyx x-ray is negative for bony abnormality including fracture or dislocation.  I have reviewed the x-ray myself and the radiologist interpretation.  I am in agreement with the radiologist interpretation.   ASSESSMENT & PLAN:  1. Fall, initial encounter   2. Coccyx pain   3. Right elbow pain     No orders of the defined types were placed in this encounter.  Discharge instructions  Rest, ice and heat as needed Ensure adequate range of motion as tolerated. Continue to take Tylenol as needed for pain  Follow-up with PCP Return here or go to ER if you have any new or worsening symptoms such as numbness/tingling of the inner thighs, loss of bladder or bowel control, headache/blurry vision, nausea/vomiting, confusion/altered mental status, dizziness, weakness, passing out, imbalance, etc...   Reviewed expectations re: course of current medical issues. Questions answered. Outlined signs and symptoms indicating need for more acute intervention. Patient verbalized understanding. After Visit Summary given.        Emerson Monte, FNP 08/11/20 1325

## 2020-08-11 NOTE — ED Triage Notes (Signed)
Pt fell approx x 1 week ago, c/o tailbone and RT elbow pain.  Pt reports hx of frequent falls, states he did not pass out.

## 2020-08-11 NOTE — Discharge Instructions (Addendum)
Rest, ice and heat as needed Ensure adequate range of motion as tolerated. Continue to take Tylenol as needed for pain  Follow-up with PCP Return here or go to ER if you have any new or worsening symptoms such as numbness/tingling of the inner thighs, loss of bladder or bowel control, headache/blurry vision, nausea/vomiting, confusion/altered mental status, dizziness, weakness, passing out, imbalance, etc..Marland Kitchen

## 2020-08-25 ENCOUNTER — Other Ambulatory Visit: Payer: Self-pay | Admitting: Neurology

## 2020-09-05 ENCOUNTER — Other Ambulatory Visit: Payer: Self-pay | Admitting: Neurology

## 2020-09-24 ENCOUNTER — Ambulatory Visit: Payer: Medicare Other | Admitting: Neurology

## 2020-10-01 ENCOUNTER — Ambulatory Visit: Payer: Medicare Other | Admitting: Neurology

## 2020-10-01 ENCOUNTER — Encounter: Payer: Self-pay | Admitting: Neurology

## 2020-10-01 ENCOUNTER — Other Ambulatory Visit: Payer: Self-pay

## 2020-10-01 VITALS — BP 116/70 | HR 72 | Ht 70.0 in | Wt 154.0 lb

## 2020-10-01 DIAGNOSIS — F0391 Unspecified dementia with behavioral disturbance: Secondary | ICD-10-CM | POA: Diagnosis not present

## 2020-10-01 DIAGNOSIS — R269 Unspecified abnormalities of gait and mobility: Secondary | ICD-10-CM | POA: Diagnosis not present

## 2020-10-01 MED ORDER — MEMANTINE HCL 10 MG PO TABS: 10.0000 mg | ORAL_TABLET | Freq: Two times a day (BID) | ORAL | 4 refills | Status: AC
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
## 2020-10-01 NOTE — Patient Instructions (Signed)
Please see psychiatry  Continue with current medications  See you back in 6 months
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
## 2020-10-09 ENCOUNTER — Ambulatory Visit: Payer: Medicare Other | Admitting: Podiatry

## 2020-10-21 ENCOUNTER — Telehealth: Payer: Self-pay

## 2020-10-21 ENCOUNTER — Other Ambulatory Visit: Payer: Self-pay

## 2020-10-21 DIAGNOSIS — I70213 Atherosclerosis of native arteries of extremities with intermittent claudication, bilateral legs: Secondary | ICD-10-CM

## 2020-10-21 NOTE — Telephone Encounter (Signed)
Patient's wife [name] called stating she has concerns regarding patient being unable to walk on his left foot. She states he had a recent fall and afterwards patient said he was unable to walk on his left foot. She states he is not in any pain, but he does have swelling and discoloration that she feels is abnormal. Denies cold feelings of his foot and/or wounds. Wife requested an appt for Tuesday or Thursday due to transportation and daughters work schedule. Patient provided with an appt on 10/31/20. I advised the [place] is at 10:45 and appt with PA will be at 1 pm. Wife voiced her understanding.
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
## 2020-10-24 ENCOUNTER — Ambulatory Visit: Payer: Medicare Other | Admitting: Cardiovascular Disease

## 2020-10-24 ENCOUNTER — Other Ambulatory Visit: Payer: Self-pay

## 2020-10-24 ENCOUNTER — Encounter: Payer: Self-pay | Admitting: Cardiovascular Disease

## 2020-10-24 VITALS — BP 102/68 | HR 80 | Ht 70.0 in | Wt 155.0 lb

## 2020-10-24 DIAGNOSIS — I10 Essential (primary) hypertension: Secondary | ICD-10-CM

## 2020-10-24 NOTE — Patient Instructions (Signed)

## 2020-10-31 ENCOUNTER — Ambulatory Visit: Payer: Medicare Other | Admitting: Physician Assistant

## 2020-10-31 ENCOUNTER — Other Ambulatory Visit: Payer: Self-pay

## 2020-10-31 ENCOUNTER — Ambulatory Visit (INDEPENDENT_AMBULATORY_CARE_PROVIDER_SITE_OTHER)
Admission: RE | Admit: 2020-10-31 | Discharge: 2020-10-31 | Disposition: A | Discharge status: Home / Self Care | Payer: Medicare Other | Source: Ambulatory Visit | Attending: Internal Medicine | Admitting: Internal Medicine

## 2020-10-31 ENCOUNTER — Ambulatory Visit (HOSPITAL_COMMUNITY)
Admission: RE | Admit: 2020-10-31 | Discharge: 2020-10-31 | Disposition: A | Discharge status: Home / Self Care | Payer: Medicare Other | Source: Ambulatory Visit | Attending: Vascular Surgery | Admitting: Vascular Surgery

## 2020-10-31 ENCOUNTER — Other Ambulatory Visit (HOSPITAL_COMMUNITY): Payer: Self-pay | Admitting: Physician Assistant

## 2020-10-31 VITALS — BP 139/83 | HR 101 | Temp 98.6°F | Resp 20 | Ht 70.0 in | Wt 155.0 lb

## 2020-10-31 DIAGNOSIS — R29898 Other symptoms and signs involving the musculoskeletal system: Secondary | ICD-10-CM | POA: Diagnosis not present

## 2020-10-31 DIAGNOSIS — F3340 Major depressive disorder, recurrent, in remission, unspecified: Secondary | ICD-10-CM | POA: Insufficient documentation

## 2020-10-31 DIAGNOSIS — H409 Unspecified glaucoma: Secondary | ICD-10-CM | POA: Insufficient documentation

## 2020-10-31 DIAGNOSIS — I2510 Atherosclerotic heart disease of native coronary artery without angina pectoris: Secondary | ICD-10-CM | POA: Insufficient documentation

## 2020-10-31 DIAGNOSIS — N400 Enlarged prostate without lower urinary tract symptoms: Secondary | ICD-10-CM | POA: Insufficient documentation

## 2020-10-31 DIAGNOSIS — M47817 Spondylosis without myelopathy or radiculopathy, lumbosacral region: Secondary | ICD-10-CM | POA: Insufficient documentation

## 2020-10-31 DIAGNOSIS — E1142 Type 2 diabetes mellitus with diabetic polyneuropathy: Secondary | ICD-10-CM | POA: Insufficient documentation

## 2020-10-31 DIAGNOSIS — M5430 Sciatica, unspecified side: Secondary | ICD-10-CM | POA: Insufficient documentation

## 2020-10-31 DIAGNOSIS — I70213 Atherosclerosis of native arteries of extremities with intermittent claudication, bilateral legs: Secondary | ICD-10-CM | POA: Diagnosis not present

## 2020-10-31 DIAGNOSIS — G8929 Other chronic pain: Secondary | ICD-10-CM | POA: Insufficient documentation

## 2020-10-31 DIAGNOSIS — I714 Abdominal aortic aneurysm, without rupture, unspecified: Secondary | ICD-10-CM | POA: Insufficient documentation

## 2020-10-31 DIAGNOSIS — G40209 Localization-related (focal) (partial) symptomatic epilepsy and epileptic syndromes with complex partial seizures, not intractable, without status epilepticus: Secondary | ICD-10-CM | POA: Insufficient documentation

## 2020-10-31 DIAGNOSIS — F419 Anxiety disorder, unspecified: Secondary | ICD-10-CM | POA: Insufficient documentation

## 2020-10-31 DIAGNOSIS — I739 Peripheral vascular disease, unspecified: Secondary | ICD-10-CM | POA: Insufficient documentation

## 2020-10-31 DIAGNOSIS — H903 Sensorineural hearing loss, bilateral: Secondary | ICD-10-CM | POA: Insufficient documentation

## 2020-10-31 DIAGNOSIS — Z87828 Personal history of other (healed) physical injury and trauma: Secondary | ICD-10-CM | POA: Insufficient documentation

## 2020-10-31 DIAGNOSIS — R911 Solitary pulmonary nodule: Secondary | ICD-10-CM | POA: Insufficient documentation

## 2020-10-31 NOTE — Progress Notes (Signed)
Office Note     CC:  LLE discoloration  HPI: [name] [name] is a 86 y.o. ([birth year]) male who presents with reports by family and daughter of inability to bear weight on his left foot since falling 2 weeks ago.  The patient ambulates with a rolling walker.  His wife accompanies him today.  She states when he tries to get up out of bed at night that he crosses his left foot over in front of his right and his very unsteady on his feet.  He currently complains of low back pain, denies lower extremity pain or rest pain.  He has had no prior lower extremity intervention. ABIs one year ago: Right = 1.0; Left = 0.87  Known history of 4.0 cm AAA. History of DM-2, hypertension, dementia, seizure disorder, frequent falls. On statin Quits cigs in 2014 Past Medical History:  Diagnosis Date  . Acid reflux 09/21/2016  . ALLERGIC RHINITIS 11/15/2008  . Asthma   . Bladder outlet obstruction 03/22/2013  . BPH (benign prostatic hypertrophy)   . Chronic airway obstruction (HCC) 11/14/2008   Methacholine challenge test for 08/04/13 while on GERD RX > neg  Overview:  Overview:  Methacholine challenge test for 08/04/13 while on GERD RX > neg  Last Assessment & Plan:  Please continue your pantoprazole 40mg  in the evening Continue your your loratadine daily You could consider increasing your fluticasone nasal spray to twice a day during the allergy months.  Use your albuterol 2 puffs as n  . Diabetes mellitus, type 2 ([place]) 09/21/2016  . Echocardiogram    Echo 12/18: EF 55-60, normal wall motion, grade 1 diastolic dysfunction, mild MR, mildly dilated RV  . ED (erectile dysfunction) of organic origin 09/21/2012  . Essential hypertension 11/15/2008  . Felon of finger of right hand 04/06/2016  . Gait difficulty   . History of hypoxemic respiratory failure 09/26/2017  . History of MI (myocardial infarction) 11/15/2008   Reported hx per patient - Patient did not have heart cath/PCI  . History of orthostatic  hypotension   . Holter Monitor    Holter 8/18:  NSR, Frequent PVCs, Occasional PACs  . Hyperlipidemia   . Increased frequency of urination 05/27/2017  . Leg weakness 04/13/2011  . Musculoskeletal symptoms referable to limbs 04/13/2011  . Nuclear stress test    Nuclear stress test 12/19: EF 57, no ischemia, +bowel atten artifact, Low Risk  . OSA (obstructive sleep apnea) 05/10/2015  . Paronychia of left middle finger 04/06/2016  . Phimosis 10/08/2016   Overview:  Added automatically from request for surgery [id number]  . Prostate cancer ([place]) 01/02/2009  . Rheumatic fever   . Right bundle branch block 03/17/2013  . Sleep apnea   . Syncope 06/01/2017    Past Surgical History:  Procedure Laterality Date  . APPENDECTOMY  1954  . CATARACT EXTRACTION Left 03/25/2017  . HERNIA REPAIR    . INCISION AND DRAINAGE ABSCESS Right 04/02/2016   Procedure: INCISION AND DRAINAGE ABSCESS;  Surgeon: Leanora Cover, MD;  Location: [facility];  Service: Orthopedics;  Laterality: Right;  Marland Kitchen VASECTOMY      Social History   Socioeconomic History  . Marital status: Married    Spouse name: Not on file  . Number of children: 2  . Years of education: 3 years of college  . Highest education level: Not on file  Occupational History  . Occupation: retired    Comment: Chartered certified accountant for [employer]  . Smoking  status: Former Smoker    Packs/day: 1.00    Years: 30.00    Pack years: 30.00    Types: Cigarettes, Pipe    Quit date: 10/19/2012    Years since quitting: 8.0  . Smokeless tobacco: Never Used  . Tobacco comment: 1 pipe every 3 days, quit smoking cig 12 yrs ago 07/06/13  Vaping Use  . Vaping Use: Never used  Substance and Sexual Activity  . Alcohol use: No    Comment: no etoh in 12 years  . Drug use: No  . Sexual activity: Not on file  Other Topics Concern  . Not on file  Social History Narrative   Lives at home with his wife.   Right-handed.   3 cups caffeine per day.    Social Determinants of Health   Financial Resource Strain: Not on file  Food Insecurity: Not on file  Transportation Needs: Not on file  Physical Activity: Not on file  Stress: Not on file  Social Connections: Not on file  Intimate Partner Violence: Not on file   Family History  Problem Relation Age of Onset  . Heart failure Mother   . Stroke Father        age 30  . Arthritis Other   . Diabetes Other   . Heart disease Other   . Hyperlipidemia Other   . Kidney disease Other   . Obesity Other   . Stroke Brother     Current Outpatient Medications  Medication Sig Dispense Refill  . acetaminophen (TYLENOL) 500 MG tablet Take 500 mg by mouth 2 (two) times daily as needed for moderate pain.     Marland Kitchen albuterol (PROVENTIL) (2.5 MG/3ML) 0.083% nebulizer solution Take 2.5 mg by nebulization daily as needed.     . ALPRAZolam (XANAX) 0.5 MG tablet Take 0.5 tablets by mouth 2 (two) times daily.    . ARIPiprazole (ABILIFY) 10 MG tablet TAKE 1 TABLET BY MOUTH EVERY DAY 90 tablet 4  . bisoprolol (ZEBETA) 5 MG tablet Take 0.5 tablets (2.5 mg total) by mouth daily. 45 tablet 3  . BREO ELLIPTA 100-25 MCG/INH AEPB Inhale 1 puff into the lungs daily.    . divalproex (DEPAKOTE) 500 MG DR tablet Take 1 tablet (500 mg total) by mouth 2 (two) times daily. 180 tablet 4  . finasteride (PROSCAR) 5 MG tablet Take 5 mg by mouth daily.    Marland Kitchen glucose blood test strip     . latanoprost (XALATAN) 0.005 % ophthalmic solution Place 1 drop into both eyes daily.     Marland Kitchen losartan (COZAAR) 25 MG tablet Take 1 tablet (25 mg total) by mouth daily.    . magnesium oxide (MAG-OX) 400 MG tablet Take 400 mg by mouth daily.    . memantine (NAMENDA) 10 MG tablet Take 1 tablet (10 mg total) by mouth 2 (two) times daily. 180 tablet 4  . mirtazapine (REMERON) 15 MG tablet Take 15 mg by mouth at bedtime.    . montelukast (SINGULAIR) 10 MG tablet Take 10 mg by mouth at bedtime.    . Multiple Vitamins-Minerals (PRESERVISION AREDS  2+MULTI VIT PO) Take 1 tablet by mouth 2 (two) times daily.    . nitroGLYCERIN (NITROSTAT) 0.4 MG SL tablet PLACE 1 TABLET UNDER THE TONGUE EVERY 5 MINS AS NEEDED FOR UP TO 3 DOSES IF PAIN CONTINUES,CALL 911 25 tablet 10  . pantoprazole (PROTONIX) 40 MG tablet TAKE 1 TABLET BY MOUTH DAILY. 90 tablet 0  . potassium chloride SA (K-DUR)  20 MEQ tablet Take 20 mEq by mouth daily.    . QUEtiapine (SEROQUEL) 25 MG tablet TAKE 2 TABLETS (50 MG TOTAL) BY MOUTH AT BEDTIME. 180 tablet 3  . rosuvastatin (CRESTOR) 10 MG tablet TAKE 1 TABLET BY MOUTH EVERY DAY 90 tablet 3  . tamsulosin (FLOMAX) 0.4 MG CAPS capsule Take 0.4 mg by mouth daily.    Marland Kitchen tiZANidine (ZANAFLEX) 4 MG tablet Take 4 mg by mouth daily.    Marland Kitchen triamterene-hydrochlorothiazide (DYAZIDE) 37.5-25 MG capsule Take 1 capsule by mouth every morning.     No current facility-administered medications for this visit.    Allergies  Allergen Reactions  . Doxycycline Monohydrate Hives  . Penicillins Hives    Has patient had a PCN reaction causing immediate rash, facial/tongue/throat swelling, SOB or lightheadedness with hypotension: No Has patient had a PCN reaction causing severe rash involving mucus membranes or skin necrosis: Yes Has patient had a PCN reaction that required hospitalization: No Has patient had a PCN reaction occurring within the last 10 years: No If all of the above answers are "NO", then may proceed with Cephalosporin use.   Marland Kitchen Fluticasone-Salmeterol     REACTION: sensation of throat closing REACTION: sensation of throat closing  . Levetiracetam Other (See Comments)    aggitation  . Lisinopril Cough  . Prednisone     Per ZO:[id number] agitation Per WU:[id number] agitation  . Shark Cartilage   . Tetracyclines & Related Hives  . Tetracycline Rash     REVIEW OF SYSTEMS:   [X]  denotes positive finding, [ ]  denotes negative finding Cardiac  Comments:  Chest pain or chest pressure:    Shortness of breath upon exertion:    Short  of breath when lying flat:    Irregular heart rhythm:        Vascular    Pain in calf, thigh, or hip brought on by ambulation:    Pain in feet at night that wakes you up from your sleep:     Blood clot in your veins:    Leg swelling:         Pulmonary    Oxygen at home:    Productive cough:     Wheezing:         Neurologic    Sudden weakness in arms or legs:     Sudden numbness in arms or legs:     Sudden onset of difficulty speaking or slurred speech:    Temporary loss of vision in one eye:     Problems with dizziness:         Gastrointestinal    Blood in stool:     Vomited blood:         Genitourinary    Burning when urinating:     Blood in urine:        Psychiatric    Major depression:         Hematologic    Bleeding problems:    Problems with blood clotting too easily:        Skin    Rashes or ulcers:        Constitutional    Fever or chills:      PHYSICAL EXAMINATION:  Vitals:   10/31/20 1316  BP: 139/83  Pulse: (!) 101  Resp: 20  Temp: 98.6 F (37 C)  SpO2: 99%    General:  WDWN in NAD; vital signs documented above Gait: Not observed HENT: WNL, normocephalic Pulmonary: normal non-labored breathing , without Rales,  rhonchi,  wheezing Cardiac: regular HR, slight increased rate Skin: without rashes Vascular Exam/Pulses: no palpable pedal pulses. Brisk left DP, PT and peroneal Doppler signals Extremities:  left foot is slightly cool to touch as compared to right, purple discolration.  without Gangrene , without cellulitis; without open wounds;  Musculoskeletal: no muscle wasting or atrophy  Neurologic: A&O X 3;  No focal weakness or paresthesias are detected Psychiatric:  The pt has Normal affect. Has dementia, but is alert, oriented   Non-Invasive Vascular Imaging:   10/31/2020 Summary:  RIGHT:  - No evidence of deep vein thrombosis in the lower extremity. No indirect  evidence of obstruction proximal to the inguinal ligament.    LEFT:  -  There is no evidence of deep vein thrombosis in the lower extremity.  - There is no evidence of superficial venous thrombosis.    - No cystic structure found in the popliteal fossa.   ABI/TBIToday's ABIToday's TBIPrevious ABIPrevious TBI  +-------+-----------+-----------+------------+------------+  Right 0.94    0.35    0.89    NA       +-------+-----------+-----------+------------+------------+  Left  1.09    0.53    0.97    NA       +-------+-----------+-----------+------------+------------+   Biphasic waveforms bilaterally.  Previous ABI 04/04/20 at [facility] rule out falsely elevated pedal pressures due to medial  calcification.    Summary:  Right: Resting right ankle-brachial index indicates mild right lower  extremity arterial disease. The right toe-brachial index is abnormal. RT  great toe pressure = 47 mmHg.   Left: Resting left ankle-brachial index is within normal range. No  evidence of significant left lower extremity arterial disease. The left  toe-brachial index is abnormal. LT Great toe pressure = 71 mmHg.  ASSESSMENT/PLAN:: 86 y.o. male here for acute left lower extremity weakness with slightly cool skin temperature and intact Doppler signals. ABIs and toe pressures not indicative of acute event.  I do not think the LLE weakness is due to vascular disease.  I advised he and his wife to contact their PCP and arrange referral as soon as possible to neurologist and/or orthopedic surgeon as the weakness could be consequence of fall; due to fracture, dislocation or neurologic etiology. They verbalize understanding.  Has appointment in February for aorta ultrasound and follow-up with Dr. Trula Slade.  Barbie Banner, PA-C [facility] [phone]  Clinic MD:   Scot Dock

## 2020-11-05 ENCOUNTER — Ambulatory Visit: Payer: Medicare Other | Admitting: Podiatry

## 2020-11-08 DIAGNOSIS — M533 Sacrococcygeal disorders, not elsewhere classified: Secondary | ICD-10-CM | POA: Diagnosis not present

## 2020-11-08 DIAGNOSIS — W19XXXA Unspecified fall, initial encounter: Secondary | ICD-10-CM | POA: Diagnosis not present

## 2020-11-08 DIAGNOSIS — I10 Essential (primary) hypertension: Secondary | ICD-10-CM | POA: Diagnosis not present

## 2020-11-28 DIAGNOSIS — E1169 Type 2 diabetes mellitus with other specified complication: Secondary | ICD-10-CM | POA: Diagnosis not present

## 2020-11-28 DIAGNOSIS — E1342 Other specified diabetes mellitus with diabetic polyneuropathy: Secondary | ICD-10-CM | POA: Diagnosis not present

## 2020-11-28 DIAGNOSIS — I10 Essential (primary) hypertension: Secondary | ICD-10-CM | POA: Diagnosis not present

## 2020-12-04 ENCOUNTER — Ambulatory Visit (INDEPENDENT_AMBULATORY_CARE_PROVIDER_SITE_OTHER): Payer: Medicare Other

## 2020-12-04 ENCOUNTER — Ambulatory Visit
Admission: EM | Admit: 2020-12-04 | Discharge: 2020-12-04 | Disposition: A | Discharge status: Home / Self Care | Payer: Medicare Other | Attending: Emergency Medicine | Admitting: Emergency Medicine

## 2020-12-04 ENCOUNTER — Other Ambulatory Visit: Payer: Self-pay

## 2020-12-04 ENCOUNTER — Encounter: Payer: Self-pay | Admitting: Emergency Medicine

## 2020-12-04 ENCOUNTER — Ambulatory Visit: Payer: Self-pay

## 2020-12-04 DIAGNOSIS — M25512 Pain in left shoulder: Secondary | ICD-10-CM | POA: Diagnosis not present

## 2020-12-04 DIAGNOSIS — S42035A Nondisplaced fracture of lateral end of left clavicle, initial encounter for closed fracture: Secondary | ICD-10-CM | POA: Diagnosis not present

## 2020-12-04 DIAGNOSIS — W19XXXA Unspecified fall, initial encounter: Secondary | ICD-10-CM

## 2020-12-04 NOTE — ED Provider Notes (Signed)
[name] [name]    CSN: [id number] Arrival date & time: 12/04/20  1437      History   Chief Complaint Chief Complaint  Patient presents with  . Fall  . Shoulder Pain    left    HPI [name] [name] is a 86 y.o. male.   Patient 86 year old male here with left shoulder pain following fall yesterday.  Patient is history of frequent falls and history of dementia.  Wife did call EMS yesterday to help get patient off floor and to assess patient.  At that time, patient did not want to be evaluated in the ED.  Patient denies hitting his head or loss of consciousness.  Patient with sharp, stabbing left shoulder pain that is worsening with movement, relieved with rest.  Patient reports pain radiates up shoulder and reports pain is constant.  Denies any other concerns.   The history is provided by the patient and a relative.  Fall Pertinent negatives include no headaches.  Shoulder Pain Associated symptoms: no back pain and no neck pain     Past Medical History:  Diagnosis Date  . Acid reflux 09/21/2016  . ALLERGIC RHINITIS 11/15/2008  . Asthma   . Bladder outlet obstruction 03/22/2013  . BPH (benign prostatic hypertrophy)   . Chronic airway obstruction (HCC) 11/14/2008   Methacholine challenge test for 08/04/13 while on GERD RX > neg  Overview:  Overview:  Methacholine challenge test for 08/04/13 while on GERD RX > neg  Last Assessment & Plan:  Please continue your pantoprazole 40mg  in the evening Continue your your loratadine daily You could consider increasing your fluticasone nasal spray to twice a day during the allergy months.  Use your albuterol 2 puffs as n  . Diabetes mellitus, type 2 ([place]) 09/21/2016  . Echocardiogram    Echo 12/18: EF 55-60, normal wall motion, grade 1 diastolic dysfunction, mild MR, mildly dilated RV  . ED (erectile dysfunction) of organic origin 09/21/2012  . Essential hypertension 11/15/2008  . Felon of finger of right hand 04/06/2016  . Gait difficulty    . History of hypoxemic respiratory failure 09/26/2017  . History of MI (myocardial infarction) 11/15/2008   Reported hx per patient - Patient did not have heart cath/PCI  . History of orthostatic hypotension   . Holter Monitor    Holter 8/18:  NSR, Frequent PVCs, Occasional PACs  . Hyperlipidemia   . Increased frequency of urination 05/27/2017  . Leg weakness 04/13/2011  . Musculoskeletal symptoms referable to limbs 04/13/2011  . Nuclear stress test    Nuclear stress test 12/19: EF 57, no ischemia, +bowel atten artifact, Low Risk  . OSA (obstructive sleep apnea) 05/10/2015  . Paronychia of left middle finger 04/06/2016  . Phimosis 10/08/2016   Overview:  Added automatically from request for surgery [id number]  . Prostate cancer ([place]) 01/02/2009  . Rheumatic fever   . Right bundle branch block 03/17/2013  . Sleep apnea   . Syncope 06/01/2017    Patient Active Problem List   Diagnosis Date Noted  . Abdominal aortic aneurysm without rupture ([place]) 10/31/2020  . Anxiety 10/31/2020  . Atherosclerosis of coronary artery 10/31/2020  . Benign prostatic hyperplasia without lower urinary tract symptoms 10/31/2020  . Bilateral sensorineural hearing loss 10/31/2020  . Chronic pain 10/31/2020  . Complex partial epileptic seizure ([place]) 10/31/2020  . Diabetic peripheral neuropathy ([place]) 10/31/2020  . Glaucoma 10/31/2020  . History of subdural hematoma (post traumatic) 10/31/2020  . Lumbosacral spondylosis without  myelopathy 10/31/2020  . Recurrent major depression in remission ([place]) 10/31/2020  . Sciatica 10/31/2020  . Solitary pulmonary nodule 10/31/2020  . Dementia with behavioral disturbance ([place]) 02/06/2020  . Peripheral arterial disease ([place]) 10/24/2019  . Pain due to onychomycosis of toenails of both feet 04/12/2019  . Frequent falls 03/09/2019  . Partial symptomatic epilepsy with complex partial seizures, not intractable, without status epilepticus ([place]) 09/20/2018  . Coronary artery  calcification seen on CT scan 09/06/2018  . Aphasia 08/16/2018  . Gait abnormality 08/16/2018  . Subdural hematoma ([place]) 08/16/2018  . Erroneous encounter - disregard 10/05/2017  . Acute bronchitis 09/26/2017  . Acute hypoxemic respiratory failure ([place]) 09/26/2017  . Syncope 06/01/2017  . Increased frequency of urination 05/27/2017  . Phimosis 10/08/2016  . Asthma without status asthmaticus 09/21/2016  . Asthma attack 09/21/2016  . Diabetes mellitus, type 2 ([place]) 09/21/2016  . Acid reflux 09/21/2016  . Personal history of prostate cancer 06/18/2016  . Felon of finger of right hand 04/06/2016  . Paronychia of left middle finger 04/06/2016  . OSA (obstructive sleep apnea) 05/10/2015  . Bladder outlet obstruction 03/22/2013  . Right bundle branch block 03/17/2013  . Incomplete emptying of bladder 09/22/2012  . ED (erectile dysfunction) of organic origin 09/21/2012  . Leg weakness 04/13/2011  . Musculoskeletal symptoms referable to limbs 04/13/2011  . CA of prostate ([place]) 01/02/2009  . Cough 11/20/2008  . Hyperlipidemia 11/15/2008  . Benign essential HTN 11/15/2008  . ALLERGIC RHINITIS 11/15/2008  . Chronic airway obstruction ([place]) 11/14/2008    Past Surgical History:  Procedure Laterality Date  . APPENDECTOMY  1954  . CATARACT EXTRACTION Left 03/25/2017  . HERNIA REPAIR    . INCISION AND DRAINAGE ABSCESS Right 04/02/2016   Procedure: INCISION AND DRAINAGE ABSCESS;  Surgeon: Leanora Cover, MD;  Location: [facility];  Service: Orthopedics;  Laterality: Right;  Marland Kitchen VASECTOMY         Home Medications    Prior to Admission medications   Medication Sig Start Date End Date Taking? Authorizing Provider  acetaminophen (TYLENOL) 500 MG tablet Take 500 mg by mouth 2 (two) times daily as needed for moderate pain.     [provider]  albuterol (PROVENTIL) (2.5 MG/3ML) 0.083% nebulizer solution Take 2.5 mg by nebulization daily as needed.  12/04/18   [provider]  ALPRAZolam Duanne Moron) 0.5 MG tablet Take 0.5 tablets by mouth 2 (two) times daily. 03/07/18   [provider]  ARIPiprazole (ABILIFY) 10 MG tablet TAKE 1 TABLET BY MOUTH EVERY DAY 09/05/20   Marcial Pacas, MD  bisoprolol (ZEBETA) 5 MG tablet Take 0.5 tablets (2.5 mg total) by mouth daily. 01/11/20   Nahser, Wonda Cheng, MD  BREO ELLIPTA 100-25 MCG/INH AEPB Inhale 1 puff into the lungs daily. 07/31/20   [provider]  divalproex (DEPAKOTE) 500 MG DR tablet Take 1 tablet (500 mg total) by mouth 2 (two) times daily. 07/18/20   Marcial Pacas, MD  finasteride (PROSCAR) 5 MG tablet Take 5 mg by mouth daily.    [provider]  glucose blood test strip  08/04/13   [provider]  latanoprost (XALATAN) 0.005 % ophthalmic solution Place 1 drop into both eyes daily.  08/07/16   [provider]  losartan (COZAAR) 25 MG tablet Take 1 tablet (25 mg total) by mouth daily. 09/05/19   Sande Rives E, PA-C  magnesium oxide (MAG-OX) 400 MG tablet Take 400 mg by mouth daily.    [provider]  memantine (NAMENDA) 10 MG tablet Take 1 tablet (10 mg total) by mouth 2 (two) times daily. 10/01/20   Suzzanne Cloud, NP  mirtazapine (REMERON) 15 MG tablet Take 15 mg by mouth at bedtime. 05/08/20   [provider]  montelukast (SINGULAIR) 10 MG tablet Take 10 mg by mouth at bedtime.    [provider]  Multiple Vitamins-Minerals (PRESERVISION AREDS 2+MULTI VIT PO) Take 1 tablet by mouth 2 (two) times daily.    [provider]  nitroGLYCERIN (NITROSTAT) 0.4 MG SL tablet PLACE 1 TABLET UNDER THE TONGUE EVERY 5 MINS AS NEEDED FOR UP TO 3 DOSES IF PAIN CONTINUES,CALL 911 07/31/19   Nahser, Wonda Cheng, MD  pantoprazole (PROTONIX) 40 MG tablet TAKE 1 TABLET BY MOUTH DAILY. 01/13/16   Collene Gobble, MD  potassium chloride SA (K-DUR) 20 MEQ tablet Take 20 mEq by mouth daily.    [provider]  QUEtiapine (SEROQUEL) 25 MG tablet TAKE 2  TABLETS (50 MG TOTAL) BY MOUTH AT BEDTIME. 08/25/20   Marcial Pacas, MD  rosuvastatin (CRESTOR) 10 MG tablet TAKE 1 TABLET BY MOUTH EVERY DAY 05/25/19   Nahser, Wonda Cheng, MD  tamsulosin (FLOMAX) 0.4 MG CAPS capsule Take 0.4 mg by mouth daily. 08/13/20   [provider]  tiZANidine (ZANAFLEX) 4 MG tablet Take 4 mg by mouth daily. 07/09/20   [provider]  triamterene-hydrochlorothiazide (DYAZIDE) 37.5-25 MG capsule Take 1 capsule by mouth every morning. 02/22/20   [provider]    Family History Family History  Problem Relation Age of Onset  . Heart failure Mother   . Stroke Father        age 43  . Arthritis Other   . Diabetes Other   . Heart disease Other   . Hyperlipidemia Other   . Kidney disease Other   . Obesity Other   . Stroke Brother     Social History Social History   Tobacco Use  . Smoking status: Former Smoker    Packs/day: 1.00    Years: 30.00    Pack years: 30.00    Types: Cigarettes, Pipe    Quit date: 10/19/2012    Years since quitting: 8.1  . Smokeless tobacco: Never Used  . Tobacco comment: 1 pipe every 3 days, quit smoking cig 12 yrs ago 07/06/13  Vaping Use  . Vaping Use: Never used  Substance Use Topics  . Alcohol use: No    Comment: no etoh in 12 years  . Drug use: No     Allergies   Doxycycline monohydrate, Penicillins, Fluticasone-salmeterol, Levetiracetam, Lisinopril, Prednisone, Shark cartilage, Tetracyclines & related, and Tetracycline   Review of Systems Review of Systems  Musculoskeletal: Positive for myalgias. Negative for back pain, joint swelling, neck pain and neck stiffness.  Neurological: Negative for facial asymmetry, weakness, numbness and headaches.     Physical Exam Triage Vital Signs ED Triage Vitals  Enc Vitals Group     BP 12/04/20 1450 (!) 161/103     Pulse Rate 12/04/20 1450 91     Resp 12/04/20 1450 18     Temp 12/04/20 1450 98.8 F (37.1 C)     Temp Source 12/04/20 1450 Oral     SpO2  12/04/20 1450 98 %     Weight --      Height --      Head Circumference --      Peak Flow --      Pain Score 12/04/20 1453 10  Pain Loc --      Pain Edu? --      Excl. in [place]? --    No data found.  Updated Vital Signs BP (!) 161/103 (BP Location: Left Arm)   Pulse 91   Temp 98.8 F (37.1 C) (Oral)   Resp 18   SpO2 98%   Visual Acuity Right Eye Distance:   Left Eye Distance:   Bilateral Distance:    Right Eye Near:   Left Eye Near:    Bilateral Near:     Physical Exam Vitals and nursing note reviewed.  Constitutional:      General: He is not in acute distress.    Appearance: He is not ill-appearing or diaphoretic.  HENT:     Head: Normocephalic and atraumatic. No contusion or laceration.  Eyes:     Conjunctiva/sclera: Conjunctivae normal.     Pupils: Pupils are equal, round, and reactive to light.  Pulmonary:     Effort: Pulmonary effort is normal.  Musculoskeletal:     Right shoulder: Normal.     Left shoulder: Tenderness and bony tenderness present. No crepitus. Decreased range of motion. Normal pulse.     Cervical back: Normal range of motion and neck supple. No tenderness. No muscular tenderness.  Neurological:     Mental Status: He is alert.      UC Treatments / Results  Labs (all labs ordered are listed, but only abnormal results are displayed) Labs Reviewed - No data to display  EKG   Radiology DG Shoulder Left  Result Date: 12/04/2020 CLINICAL DATA:  Fall with shoulder pain EXAM: LEFT SHOULDER - 2+ VIEW COMPARISON:  None. FINDINGS: Suspected acute fracture through the distal left clavicle. No displacement. There is alignment at the glenohumeral joint. No acute fracture of the humerus. IMPRESSION: Suspected nondisplaced acute fracture through the distal left clavicle. Electronically Signed   By: Macy Mis M.D.   On: 12/04/2020 15:38    Procedures Procedures (including critical care time)  Medications Ordered in UC Medications - No data  to display  Initial Impression / Assessment and Plan / UC Course  I have reviewed the triage vital signs and the nursing notes.  Pertinent labs & imaging results that were available during my care of the patient were reviewed by me and considered in my medical decision making (see chart for details).     Left clavicle fracture- patient given sling for comfort and instructed to use ice and tylenol as need for pain.  Contact for orthopedic given for follow up.  No other concerns on exam.   Plan discussed with patient and wife/caregiver.     Final Clinical Impressions(s) / UC Diagnoses   Final diagnoses:  Closed nondisplaced fracture of acromial end of left clavicle, initial encounter     Discharge Instructions     You have a left clavicle fracture.  Wear the sling for comfort, try to take arm out occassionally to make small shoulder movements to prevent it form getting stiff.   You can take tylenol as needed and use ice for pain management.    Follow up with orthopedics.      ED Prescriptions    None     PDMP not reviewed this encounter.   Pearson Forster, NP 12/04/20 1556

## 2020-12-04 NOTE — Discharge Instructions (Signed)
You have a left clavicle fracture.  Wear the sling for comfort, try to take arm out occassionally to make small shoulder movements to prevent it form getting stiff.   You can take tylenol as needed and use ice for pain management.    Follow up with orthopedics.

## 2020-12-04 NOTE — ED Triage Notes (Signed)
Pt brought in by wife and daughter with c/o of fall. He reports falling losing balance in bathroom last night striking the floor. Took Tylenol this am for pain. 10/10.

## 2020-12-10 ENCOUNTER — Other Ambulatory Visit (HOSPITAL_COMMUNITY): Payer: Medicare Other

## 2020-12-10 ENCOUNTER — Ambulatory Visit: Payer: Medicare Other

## 2020-12-10 DIAGNOSIS — S42035A Nondisplaced fracture of lateral end of left clavicle, initial encounter for closed fracture: Secondary | ICD-10-CM | POA: Diagnosis not present

## 2020-12-25 DIAGNOSIS — M25512 Pain in left shoulder: Secondary | ICD-10-CM | POA: Diagnosis not present

## 2021-01-03 ENCOUNTER — Encounter: Payer: Self-pay | Admitting: Cardiovascular Disease

## 2021-01-03 ENCOUNTER — Encounter: Payer: Medicare Other | Admitting: Cardiovascular Disease

## 2021-01-03 DIAGNOSIS — R0689 Other abnormalities of breathing: Secondary | ICD-10-CM | POA: Diagnosis not present

## 2021-01-03 DIAGNOSIS — T17920A Food in respiratory tract, part unspecified causing asphyxiation, initial encounter: Secondary | ICD-10-CM | POA: Diagnosis not present

## 2021-01-03 DIAGNOSIS — I499 Cardiac arrhythmia, unspecified: Secondary | ICD-10-CM | POA: Diagnosis not present

## 2021-01-03 DIAGNOSIS — I4510 Unspecified right bundle-branch block: Secondary | ICD-10-CM | POA: Diagnosis not present

## 2021-01-03 NOTE — Telephone Encounter (Signed)
error 

## 2021-01-03 NOTE — Progress Notes (Signed)
No show  This encounter was created in error - please disregard.

## 2021-01-10 ENCOUNTER — Encounter: Payer: Self-pay | Admitting: Cardiovascular Disease

## 2021-01-10 ENCOUNTER — Other Ambulatory Visit: Payer: Self-pay

## 2021-01-10 ENCOUNTER — Encounter: Payer: Self-pay | Admitting: Podiatry

## 2021-01-10 ENCOUNTER — Ambulatory Visit: Payer: Medicare Other | Admitting: Cardiovascular Disease

## 2021-01-10 ENCOUNTER — Ambulatory Visit: Payer: Medicare Other | Admitting: Podiatry

## 2021-01-10 VITALS — BP 118/72 | HR 92 | Ht 70.0 in | Wt 146.8 lb

## 2021-01-10 DIAGNOSIS — I10 Essential (primary) hypertension: Secondary | ICD-10-CM | POA: Diagnosis not present

## 2021-01-10 DIAGNOSIS — I2510 Atherosclerotic heart disease of native coronary artery without angina pectoris: Secondary | ICD-10-CM | POA: Diagnosis not present

## 2021-01-10 DIAGNOSIS — E1342 Other specified diabetes mellitus with diabetic polyneuropathy: Secondary | ICD-10-CM | POA: Diagnosis not present

## 2021-01-10 DIAGNOSIS — B351 Tinea unguium: Secondary | ICD-10-CM

## 2021-01-10 DIAGNOSIS — H409 Unspecified glaucoma: Secondary | ICD-10-CM | POA: Diagnosis not present

## 2021-01-10 DIAGNOSIS — M79674 Pain in right toe(s): Secondary | ICD-10-CM

## 2021-01-10 DIAGNOSIS — E7800 Pure hypercholesterolemia, unspecified: Secondary | ICD-10-CM | POA: Diagnosis not present

## 2021-01-10 DIAGNOSIS — M79675 Pain in left toe(s): Secondary | ICD-10-CM | POA: Diagnosis not present

## 2021-01-10 DIAGNOSIS — M4726 Other spondylosis with radiculopathy, lumbar region: Secondary | ICD-10-CM | POA: Diagnosis not present

## 2021-01-10 DIAGNOSIS — J449 Chronic obstructive pulmonary disease, unspecified: Secondary | ICD-10-CM | POA: Diagnosis not present

## 2021-01-10 DIAGNOSIS — K219 Gastro-esophageal reflux disease without esophagitis: Secondary | ICD-10-CM | POA: Diagnosis not present
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
## 2021-01-10 NOTE — Patient Instructions (Signed)
Medication Instructions:  Your physician recommends that you continue on your current medications as directed. Please refer to the Current Medication list given to you today.  *If you need a refill on your cardiac medications before your next appointment, please call your pharmacy*   Lab Work: none If you have labs (blood work) drawn today and your tests are completely normal, you will receive your results only by: Marland Kitchen MyChart Message (if you have MyChart) OR . A paper copy in the mail If you have any lab test that is abnormal or we need to change your treatment, we will call you to review the results.   Testing/Procedures: none   Follow-Up: At [facility], you and your health needs are our priority.  As part of our continuing mission to provide you with exceptional heart care, we have created designated Provider Care Teams.  These Care Teams include your primary Cardiologist (physician) and Advanced Practice Providers (APPs -  Physician Assistants and Nurse Practitioners) who all work together to provide you with the care you need, when you need it.  We recommend signing up for the patient portal called "MyChart".  Sign up information is provided on this After Visit Summary.  MyChart is used to connect with patients for Virtual Visits (Telemedicine).  Patients are able to view lab/test results, encounter notes, upcoming appointments, etc.  Non-urgent messages can be sent to your provider as well.   To learn more about what you can do with MyChart, go to [email or web address].    Your next appointment:    as needed  The format for your next appointment:   In Person  Provider:   You may see Mertie Moores, MD or one of the following Advanced Practice Providers on your designated Care Team:    Richardson Dopp, PA-C  [place], [place]

## 2021-01-13 NOTE — Progress Notes (Signed)
Subjective:   Patient ID: [name] [name], male   DOB: 86 y.o.   MRN: [id number]   HPI Patient presents with elongated nailbeds 1-5 both feet that are thick yellow and painful for the patient.  States that he has trouble taking care of them himself   ROS      Objective:  Physical Exam  Neurovascular status intact with thick yellow incurvated nailbeds 1-5 both feet painful     Assessment:  Mycotic nail infection with pain 1-5 both feet     Plan:  Education rendered H&P done debrided nailbeds 1-5 both feet no iatrogenic bleeding reappoint routine care

## 2021-01-18 ENCOUNTER — Ambulatory Visit
Admission: RE | Admit: 2021-01-18 | Discharge: 2021-01-18 | Discharge status: Against Medical Advice | Payer: Medicare Other | Source: Ambulatory Visit | Attending: Internal Medicine | Admitting: Internal Medicine

## 2021-01-18 ENCOUNTER — Other Ambulatory Visit: Payer: Self-pay

## 2021-01-18 NOTE — ED Notes (Signed)
Pt. Unable to stand for x-ray.  Wife can out of room and stated "since ya'll can't get an x-ray on him, there is no point for [place] to be here.  Wife and patient left.

## 2021-01-18 NOTE — ED Triage Notes (Signed)
Right hip pain x 3 days.  No known injury.

## 2021-01-30 ENCOUNTER — Telehealth: Payer: Self-pay | Admitting: Neurology

## 2021-01-30 NOTE — Telephone Encounter (Signed)
Wife(on DPR) is asking for a call from RN re: pt not eating, wife and daughter would like suggestions from [employer]

## 2021-01-30 NOTE — Telephone Encounter (Signed)
I called wife.  Pt has been losing weight, due to not eating.  Last seen 154 lbs in our office and seen cardiology was down to 145lbs.  Asking what to do.  I relayed that cannot force anyone to eat.  Recommended seeing pcp for evaluation to make sure not having sx issues with throat, difficulty swallowing.  He does drink protein shakes and seems to like them usually one daily.  They may increase that but check with pcp.  If having weakness could be related to diet, may need hospitalization for nutrition.  She appreciated call and I will let SS/NP know if she had any more recommendations. Next appt 02-25-21.

## 2021-02-05 DIAGNOSIS — Z0001 Encounter for general adult medical examination with abnormal findings: Secondary | ICD-10-CM | POA: Diagnosis not present

## 2021-02-05 DIAGNOSIS — M1610 Unilateral primary osteoarthritis, unspecified hip: Secondary | ICD-10-CM | POA: Diagnosis not present

## 2021-02-25 ENCOUNTER — Encounter: Payer: Self-pay | Admitting: Neurology

## 2021-02-25 ENCOUNTER — Ambulatory Visit (INDEPENDENT_AMBULATORY_CARE_PROVIDER_SITE_OTHER): Payer: PRIVATE HEALTH INSURANCE | Admitting: Neurology

## 2021-02-25 VITALS — BP 99/61 | HR 75 | Ht 70.0 in | Wt 131.0 lb

## 2021-02-25 DIAGNOSIS — F0391 Unspecified dementia with behavioral disturbance: Secondary | ICD-10-CM | POA: Diagnosis not present

## 2021-02-25 DIAGNOSIS — G40209 Localization-related (focal) (partial) symptomatic epilepsy and epileptic syndromes with complex partial seizures, not intractable, without status epilepticus: Secondary | ICD-10-CM

## 2021-02-25 NOTE — Patient Instructions (Addendum)
Continue to work with hospice, to manage the mood  It's ok to stop the [place] See you back as needed
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
## 2021-03-11 ENCOUNTER — Emergency Department (HOSPITAL_COMMUNITY)

## 2021-03-11 ENCOUNTER — Other Ambulatory Visit: Payer: Self-pay

## 2021-03-11 ENCOUNTER — Encounter (HOSPITAL_COMMUNITY): Payer: Self-pay

## 2021-03-11 ENCOUNTER — Emergency Department (HOSPITAL_COMMUNITY)
Admission: EM | Admit: 2021-03-11 | Discharge: 2021-03-11 | Disposition: A | Discharge status: Home / Self Care | Attending: Emergency Medicine | Admitting: Emergency Medicine

## 2021-03-11 DIAGNOSIS — Z87891 Personal history of nicotine dependence: Secondary | ICD-10-CM | POA: Diagnosis not present

## 2021-03-11 DIAGNOSIS — E1169 Type 2 diabetes mellitus with other specified complication: Secondary | ICD-10-CM | POA: Diagnosis not present

## 2021-03-11 DIAGNOSIS — Z8546 Personal history of malignant neoplasm of prostate: Secondary | ICD-10-CM | POA: Diagnosis not present

## 2021-03-11 DIAGNOSIS — E785 Hyperlipidemia, unspecified: Secondary | ICD-10-CM | POA: Diagnosis not present

## 2021-03-11 DIAGNOSIS — W19XXXA Unspecified fall, initial encounter: Secondary | ICD-10-CM

## 2021-03-11 DIAGNOSIS — I10 Essential (primary) hypertension: Secondary | ICD-10-CM | POA: Diagnosis not present

## 2021-03-11 DIAGNOSIS — Z79899 Other long term (current) drug therapy: Secondary | ICD-10-CM | POA: Insufficient documentation

## 2021-03-11 DIAGNOSIS — F0390 Unspecified dementia without behavioral disturbance: Secondary | ICD-10-CM | POA: Insufficient documentation

## 2021-03-11 DIAGNOSIS — J45909 Unspecified asthma, uncomplicated: Secondary | ICD-10-CM | POA: Insufficient documentation

## 2021-03-11 DIAGNOSIS — S7002XA Contusion of left hip, initial encounter: Secondary | ICD-10-CM | POA: Insufficient documentation

## 2021-03-11 DIAGNOSIS — S79912A Unspecified injury of left hip, initial encounter: Secondary | ICD-10-CM | POA: Diagnosis present

## 2021-03-11 DIAGNOSIS — W06XXXA Fall from bed, initial encounter: Secondary | ICD-10-CM | POA: Insufficient documentation

## 2021-03-11 NOTE — ED Triage Notes (Signed)
BIB EMS from home with hospice. Fall 3 days ago. bruising noted to left hip. Denies pain. -shorting/rotation. Hospice nurse sent for eval of hip/

## 2021-03-11 NOTE — Discharge Instructions (Addendum)
X-rays of your hip did not reveal any evidence of fracture/dislocation.

## 2021-03-11 NOTE — Progress Notes (Signed)
Manufacturing engineer [facility]) Hospital Liaison Note:  Mr. Tarango is an [facility] patient living independently at home with his spouse on hospice services with a [facility] terminal diagnosis of Abnormal weight loss, unspecified dementia with behavioral disturbances.   [facility] SW was notified by [facility] via telephone this morning that patient was being sent to [facility] for evaluation/treatment after multiple falls over last couple of days with complaints of  increased agitation and pain.    [facility] HLT will follow patient during this hospitalization. Please feel free to reach out to [facility] for hospice related questions.  Gar Ponto, RN [facility] HLT  [phone]

## 2021-03-11 NOTE — ED Notes (Signed)
Called [facility] for transport home.  

## 2021-03-11 NOTE — ED Provider Notes (Signed)
[facility] DEPT Provider Note   CSN: [id number] Arrival date & time: 03/11/21  1207     History Chief Complaint  Patient presents with  . Fall    [name] [name] is a 86 y.o. male.  HPI    86 year old male comes into the ER with chief complaint of fall.  He has history of advanced dementia and is enrolled in hospice.  Wife is at the bedside as well.  She reports that patient had a fall on Friday and Saturday.  Patient does not walk, he fell from the bed.  Hospice team had asked for the patient to come to the ER for x-rays.  Patient has had some bruising and discomfort over the left hip.  Past Medical History:  Diagnosis Date  . Acid reflux 09/21/2016  . ALLERGIC RHINITIS 11/15/2008  . Asthma   . Bladder outlet obstruction 03/22/2013  . BPH (benign prostatic hypertrophy)   . Chronic airway obstruction (HCC) 11/14/2008   Methacholine challenge test for 08/04/13 while on GERD RX > neg  Overview:  Overview:  Methacholine challenge test for 08/04/13 while on GERD RX > neg  Last Assessment & Plan:  Please continue your pantoprazole 40mg  in the evening Continue your your loratadine daily You could consider increasing your fluticasone nasal spray to twice a day during the allergy months.  Use your albuterol 2 puffs as n  . Dementia ([place])   . Diabetes mellitus, type 2 ([place]) 09/21/2016  . Echocardiogram    Echo 12/18: EF 55-60, normal wall motion, grade 1 diastolic dysfunction, mild MR, mildly dilated RV  . ED (erectile dysfunction) of organic origin 09/21/2012  . Essential hypertension 11/15/2008  . Felon of finger of right hand 04/06/2016  . Gait difficulty   . History of hypoxemic respiratory failure 09/26/2017  . History of MI (myocardial infarction) 11/15/2008   Reported hx per patient - Patient did not have heart cath/PCI  . History of orthostatic hypotension   . Holter Monitor    Holter 8/18:  NSR, Frequent PVCs, Occasional PACs  . Hyperlipidemia   .  Increased frequency of urination 05/27/2017  . Leg weakness 04/13/2011  . Musculoskeletal symptoms referable to limbs 04/13/2011  . Nuclear stress test    Nuclear stress test 12/19: EF 57, no ischemia, +bowel atten artifact, Low Risk  . OSA (obstructive sleep apnea) 05/10/2015  . Paronychia of left middle finger 04/06/2016  . Phimosis 10/08/2016   Overview:  Added automatically from request for surgery [id number]  . Prostate cancer ([place]) 01/02/2009  . Rheumatic fever   . Right bundle branch block 03/17/2013  . Sleep apnea   . Syncope 06/01/2017    Patient Active Problem List   Diagnosis Date Noted  . Abdominal aortic aneurysm without rupture ([place]) 10/31/2020  . Anxiety 10/31/2020  . Atherosclerosis of coronary artery 10/31/2020  . Benign prostatic hyperplasia without lower urinary tract symptoms 10/31/2020  . Bilateral sensorineural hearing loss 10/31/2020  . Chronic pain 10/31/2020  . 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 Diabetic peripheral neuropathy ([place]) 10/31/2020  . Glaucoma 10/31/2020  . History of subdural hematoma (post traumatic) 10/31/2020  . Lumbosacral spondylosis without myelopathy 10/31/2020  . Recurrent major depression in remission ([place]) 10/31/2020  . Sciatica 10/31/2020  . Solitary pulmonary nodule 10/31/2020  . Dementia with behavioral disturbance ([place]) 02/06/2020  . Peripheral arterial disease ([place]) 10/24/2019  . Pain due to onychomycosis of toenails of both feet 04/12/2019  . Frequent falls 03/09/2019  .  Partial symptomatic epilepsy with complex partial seizures, not intractable, without status epilepticus ([place]) 09/20/2018  . Coronary artery calcification seen on CT scan 09/06/2018  . Aphasia 08/16/2018  . Gait abnormality 08/16/2018  . Subdural hematoma ([place]) 08/16/2018  . Erroneous encounter - disregard 10/05/2017  . Acute bronchitis 09/26/2017  . Acute hypoxemic respiratory failure ([place]) 09/26/2017  . Syncope 06/01/2017  . Increased frequency  of urination 05/27/2017  . Phimosis 10/08/2016  . Asthma without status asthmaticus 09/21/2016  . Asthma attack 09/21/2016  . Diabetes mellitus, type 2 ([place]) 09/21/2016  . Acid reflux 09/21/2016  . Personal history of prostate cancer 06/18/2016  . Felon of finger of right hand 04/06/2016  . Paronychia of left middle finger 04/06/2016  . OSA (obstructive sleep apnea) 05/10/2015  . Bladder outlet obstruction 03/22/2013  . Right bundle branch block 03/17/2013  . Incomplete emptying of bladder 09/22/2012  . ED (erectile dysfunction) of organic origin 09/21/2012  . Leg weakness 04/13/2011  . Musculoskeletal symptoms referable to limbs 04/13/2011  . CA of prostate ([place]) 01/02/2009  . Cough 11/20/2008  . Hyperlipidemia 11/15/2008  . Benign essential HTN 11/15/2008  . ALLERGIC RHINITIS 11/15/2008  . Chronic airway obstruction ([place]) 11/14/2008    Past Surgical History:  Procedure Laterality Date  . APPENDECTOMY  1954  . CATARACT EXTRACTION Left 03/25/2017  . HERNIA REPAIR    . INCISION AND DRAINAGE ABSCESS Right 04/02/2016   Procedure: INCISION AND DRAINAGE ABSCESS;  Surgeon: Leanora Cover, MD;  Location: [facility];  Service: Orthopedics;  Laterality: Right;  Marland Kitchen VASECTOMY         Family History  Problem Relation Age of Onset  . Heart failure Mother   . Stroke Father        age 41  . Arthritis Other   . Diabetes Other   . Heart disease Other   . Hyperlipidemia Other   . Kidney disease Other   . Obesity Other   . Stroke Brother     Social History   Tobacco Use  . Smoking status: Former Smoker    Packs/day: 1.00    Years: 30.00    Pack years: 30.00    Types: Cigarettes, Pipe    Quit date: 10/19/2012    Years since quitting: 8.3  . Smokeless tobacco: Never Used  . Tobacco comment: 1 pipe every 3 days, quit smoking cig 12 yrs ago 07/06/13  Vaping Use  . Vaping Use: Never used  Substance Use Topics  . Alcohol use: No    Comment: no etoh in 12 years  . Drug  use: No    Home Medications Prior to Admission medications   Medication Sig Start Date End Date Taking? Authorizing Provider  acetaminophen (TYLENOL) 500 MG tablet Take 500 mg by mouth 2 (two) times daily as needed for moderate pain.  Patient not taking: Reported on 02/25/2021    [provider]  albuterol (PROVENTIL) (2.5 MG/3ML) 0.083% nebulizer solution Take 2.5 mg by nebulization daily as needed.  Patient not taking: Reported on 02/25/2021 12/04/18   [provider]  ALPRAZolam Duanne Moron) 0.5 MG tablet Take 0.5 tablets by mouth 2 (two) times daily. Patient not taking: Reported on 02/25/2021 03/07/18   [provider]  bisoprolol (ZEBETA) 5 MG tablet Take 0.5 tablets (2.5 mg total) by mouth daily. Patient not taking: Reported on 02/25/2021 01/11/20   Nahser, Wonda Cheng, MD  BREO ELLIPTA 100-25 MCG/INH AEPB Inhale 1 puff into the lungs daily. Patient not taking: Reported  on 02/25/2021 07/31/20   [provider]  divalproex (DEPAKOTE) 500 MG DR tablet Take 1 tablet (500 mg total) by mouth 2 (two) times daily. 07/18/20   Marcial Pacas, MD  finasteride (PROSCAR) 5 MG tablet Take 5 mg by mouth daily. Patient not taking: Reported on 02/25/2021    [provider]  glucose blood test strip  08/04/13   [provider]  latanoprost (XALATAN) 0.005 % ophthalmic solution Place 1 drop into both eyes daily.  Patient not taking: Reported on 02/25/2021 08/07/16   [provider]  loratadine (CLARITIN) 10 MG tablet Take 10 mg by mouth daily. Patient not taking: Reported on 02/25/2021    [provider]  LORazepam (ATIVAN) 0.5 MG tablet Take 0.5 mg by mouth 2 (two) times daily as needed. 02/12/21   [provider]  losartan (COZAAR) 25 MG tablet Take 1 tablet (25 mg total) by mouth daily. 09/05/19   Sande Rives E, PA-C  magnesium oxide (MAG-OX) 400 MG tablet Take 400 mg by mouth daily. Patient not taking: Reported on 02/25/2021    [provider]  memantine (NAMENDA) 10 MG tablet Take 1 tablet (10 mg total) by mouth 2 (two) times daily. 10/01/20   Suzzanne Cloud, NP  mirtazapine (REMERON) 15 MG tablet Take 15 mg by mouth at bedtime. Patient not taking: Reported on 02/25/2021 05/08/20   [provider]  montelukast (SINGULAIR) 10 MG tablet Take 10 mg by mouth at bedtime. Patient not taking: Reported on 02/25/2021    [provider]  Multiple Vitamins-Minerals (PRESERVISION AREDS 2+MULTI VIT PO) Take 1 tablet by mouth 2 (two) times daily. Patient not taking: Reported on 02/25/2021    [provider]  nitroGLYCERIN (NITROSTAT) 0.4 MG SL tablet PLACE 1 TABLET UNDER THE TONGUE EVERY 5 MINS AS NEEDED FOR UP TO 3 DOSES IF PAIN CONTINUES,CALL 911 Patient not taking: Reported on 02/25/2021 07/31/19   Nahser, Wonda Cheng, MD  pantoprazole (PROTONIX) 40 MG tablet TAKE 1 TABLET BY MOUTH DAILY. Patient not taking: Reported on 02/25/2021 01/13/16   Collene Gobble, MD  potassium chloride SA (K-DUR) 20 MEQ tablet Take 20 mEq by mouth daily. Patient not taking: Reported on 02/25/2021    [provider]  QUEtiapine (SEROQUEL) 25 MG tablet TAKE 2 TABLETS (50 MG TOTAL) BY MOUTH AT BEDTIME. Patient not taking: Reported on 02/25/2021 08/25/20   Marcial Pacas, MD  rosuvastatin (CRESTOR) 10 MG tablet TAKE 1 TABLET BY MOUTH EVERY DAY Patient not taking: Reported on 02/25/2021 05/25/19   Nahser, Wonda Cheng, MD  tamsulosin (FLOMAX) 0.4 MG CAPS capsule Take 0.4 mg by mouth daily. Patient not taking: Reported on 02/25/2021 08/13/20   [provider]  tiZANidine (ZANAFLEX) 4 MG tablet Take 4 mg by mouth daily. Patient not taking: Reported on 02/25/2021 07/09/20   [provider]  traMADol (ULTRAM) 50 MG tablet Take 50 mg by mouth every 6 (six) hours as needed. 02/12/21   [provider]  triamterene-hydrochlorothiazide (DYAZIDE) 37.5-25 MG capsule Take 1 capsule by mouth every morning. Patient not taking:  Reported on 02/25/2021 02/22/20   [provider]    Allergies    Doxycycline monohydrate, Penicillins, Tiotropium bromide monohydrate, Fluticasone-salmeterol, Levetiracetam, Lisinopril, Prednisone, Shark cartilage, Tetracyclines & related, and Tetracycline  Review of Systems   Review of Systems  Unable to perform ROS: Dementia  Musculoskeletal: Positive for arthralgias.  Skin: Positive for wound.  Neurological: Negative for headaches.  Hematological: Does not bruise/bleed easily.  Physical Exam Updated Vital Signs BP (!) 142/117 (BP Location: Right Arm)   Pulse 88   Temp 99 F (37.2 C) (Oral)   Resp 18   Ht 5\' 10"  (1.778 m)   Wt 59 kg   SpO2 100% Comment: Hall D  BMI 18.66 kg/m   Physical Exam Vitals and nursing note reviewed.  Constitutional:      Appearance: He is well-developed.  HENT:     Head: Atraumatic.  Cardiovascular:     Rate and Rhythm: Normal rate.  Pulmonary:     Effort: Pulmonary effort is normal.  Musculoskeletal:        General: No tenderness or deformity.     Cervical back: Neck supple.  Skin:    General: Skin is warm.     Findings: Bruising present.  Neurological:     Mental Status: He is alert.     ED Results / Procedures / Treatments   Labs (all labs ordered are listed, but only abnormal results are displayed) Labs Reviewed - No data to display  EKG None  Radiology DG Hips Bilat W or Wo Pelvis 5 Views  Result Date: 03/11/2021 CLINICAL DATA:  Left hip bruising after fall 3 days ago. EXAM: DG HIP (WITH OR WITHOUT PELVIS) 5+V BILAT COMPARISON:  None. FINDINGS: There is no evidence of hip fracture or dislocation. There is no evidence of arthropathy or other focal bone abnormality. IMPRESSION: Negative. Electronically Signed   By: Marijo Conception M.D.   On: 03/11/2021 15:39    Procedures Procedures   Medications Ordered in ED Medications - No data to display  ED Course  I have reviewed the triage vital signs and the nursing  notes.  Pertinent labs & imaging results that were available during my care of the patient were reviewed by me and considered in my medical decision making (see chart for details).    MDM Rules/Calculators/A&P                          86 year old male comes in with chief complaint of fall.  Fall actually occurred more than 2 days ago, patient has bruising to the left side of his hip and at complaint of pain, prompting the hospice team to send him to the ER.  On my exam patient does not have any focal hip tenderness.  He is a little fidgety, but the wife at the bedside reports that it is normal.  We did order bilateral hip x-rays, they are negative for any acute findings.  He is moving both of his lower extremity stable for discharge now.  I was told that patient is a walker at baseline by his wife.  Final Clinical Impression(s) / ED Diagnoses Final diagnoses:  Fall, initial encounter    Rx / DC Orders ED Discharge Orders    None       Varney Biles, MD 03/11/21 1549

## 2021-03-11 NOTE — Progress Notes (Signed)
[facility] ([facility])  Pt is a current hospice patient.  If the decision is made to not admit him, he will require ambulance transport home.  Please use [facility] non-emergency transport--they contract this service for our active hospice patients. Advise dispatch that they are an Immunologist patient.  Venia Carbon RN, BSN, [facility] Liaison

## 2021-03-25 ENCOUNTER — Telehealth: Payer: Self-pay | Admitting: Neurology

## 2021-03-25 NOTE — Telephone Encounter (Signed)
FYI: Pt's daughter, Sallee Lange called, my father has passed away.

## 2021-03-25 NOTE — Telephone Encounter (Signed)
Signed sympathy card per MK/RN.

## 2021-04-18 DEATH — deceased

## 2022-01-18 IMAGING — DX DG CHEST 1V PORT
1 series · 1 of 1 positions shown · non-contrast
Comparison: 11/01/2019

CLINICAL DATA: Left-sided chest pain

EXAM:
PORTABLE CHEST 1 VIEW

[chest ap]
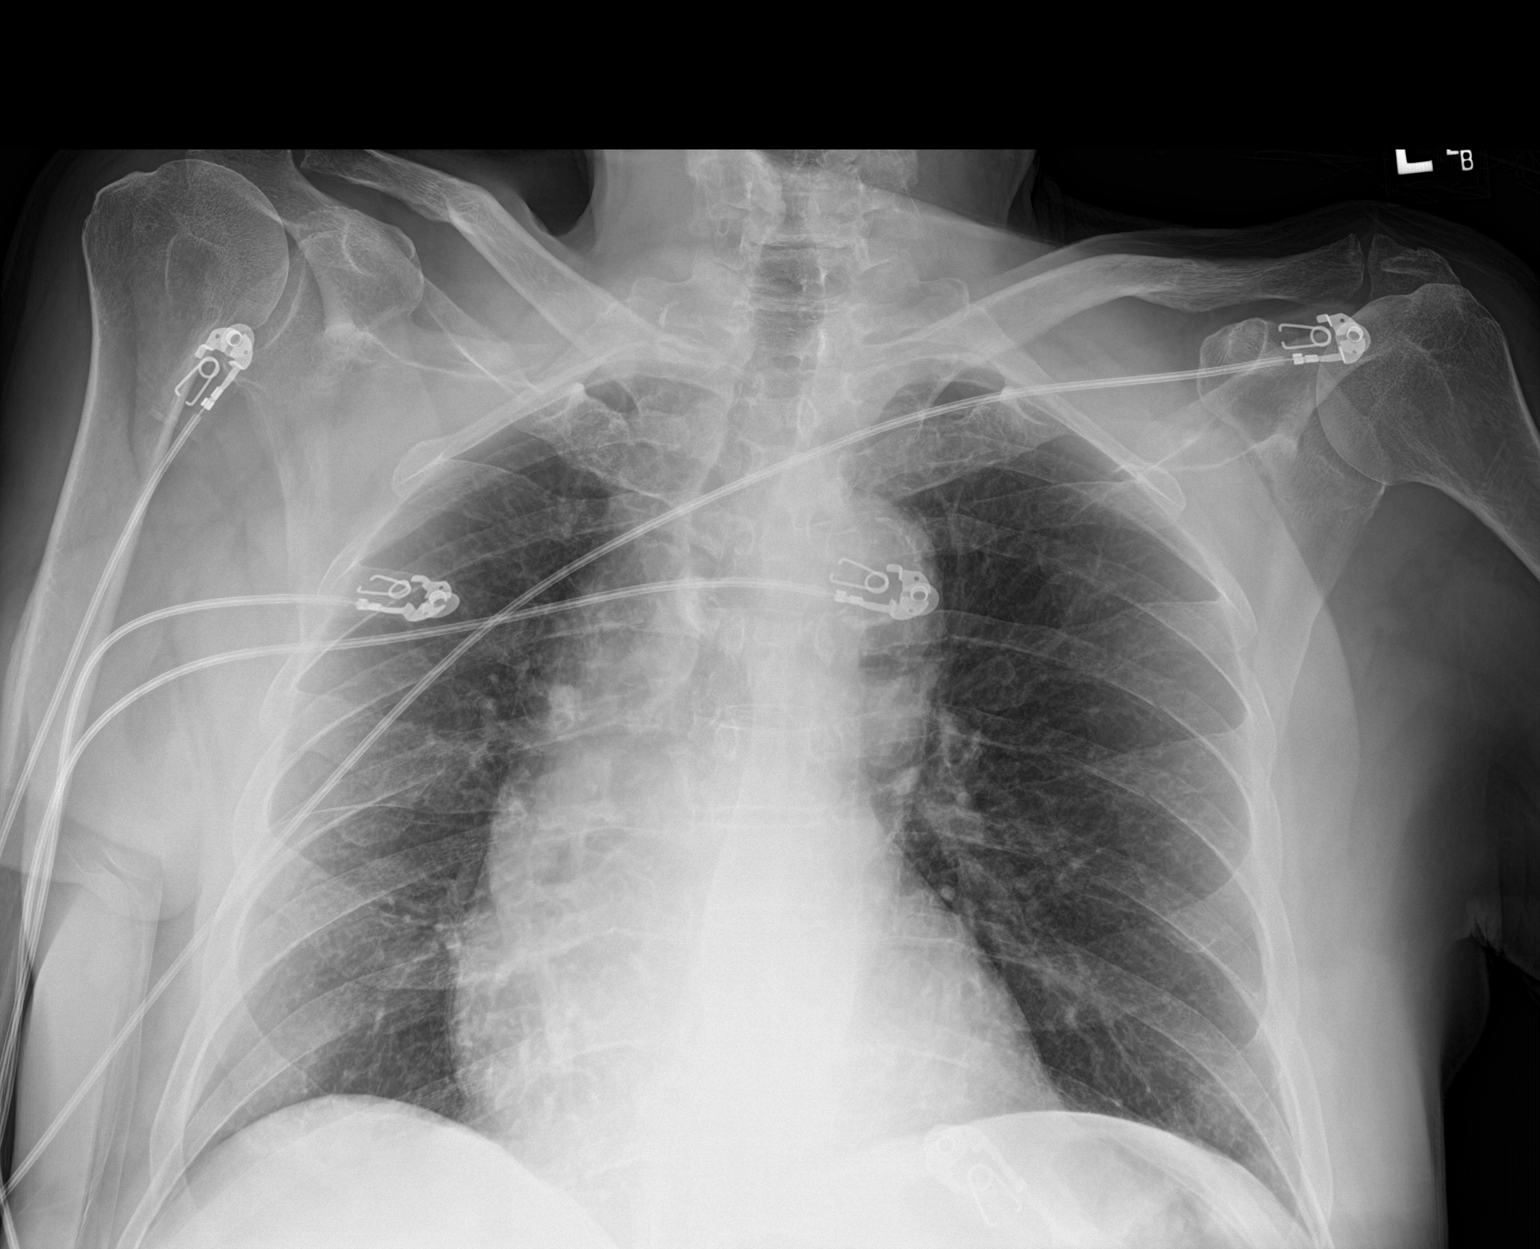

[1 of 1 positions shown; findings below may reference images not displayed]

FINDINGS: Single frontal view of the chest demonstrates an unremarkable
cardiac silhouette. No airspace disease, effusion, or pneumothorax.
No acute bony abnormalities.
IMPRESSION: 1. No acute intrathoracic process.
